# Patient Record
Sex: Male | Born: 1955 | Race: White | Hispanic: No | Marital: Married | Smoking: Former smoker
Health system: Southern US, Community
[De-identification: ages and names within clinical notes are randomized; demographics above are authoritative.]

## PROBLEM LIST (undated history)

## (undated) DIAGNOSIS — I639 Cerebral infarction, unspecified: Secondary | ICD-10-CM

## (undated) DIAGNOSIS — I2510 Atherosclerotic heart disease of native coronary artery without angina pectoris: Secondary | ICD-10-CM

## (undated) DIAGNOSIS — N35919 Unspecified urethral stricture, male, unspecified site: Secondary | ICD-10-CM

## (undated) DIAGNOSIS — M1990 Unspecified osteoarthritis, unspecified site: Secondary | ICD-10-CM

## (undated) DIAGNOSIS — K219 Gastro-esophageal reflux disease without esophagitis: Secondary | ICD-10-CM

## (undated) DIAGNOSIS — I10 Essential (primary) hypertension: Secondary | ICD-10-CM

## (undated) DIAGNOSIS — I219 Acute myocardial infarction, unspecified: Secondary | ICD-10-CM

## (undated) HISTORY — PX: CARDIAC CATHETERIZATION: SHX172

## (undated) HISTORY — PX: CORONARY ARTERY BYPASS GRAFT: SHX141

## (undated) HISTORY — PX: WRIST SURGERY: SHX841

## (undated) HISTORY — PX: VASECTOMY: SHX75

## (undated) HISTORY — PX: BACK SURGERY: SHX140

## (undated) HISTORY — DX: Cerebral infarction, unspecified: I63.9

---

## 2016-04-14 NOTE — H&P (Signed)
TOTAL HIP ADMISSION H&P  Patient is admitted for right total hip arthroplasty, anterior approach.  Subjective:  Chief Complaint:    Right hip primary OA / pain  HPI: Read Drivershillip Kluck, 60 y.o. male, has a history of pain and functional disability in the right hip(s) due to arthritis and patient has failed non-surgical conservative treatments for greater than 12 weeks to include NSAID's and/or analgesics, corticosteriod injections and activity modification.  Onset of symptoms was gradual starting 1.5-2 years ago with gradually worsening course since that time.The patient noted no past surgery on the right hip(s).  Patient currently rates pain in the right hip at 7 out of 10 with activity. Patient has worsening of pain with activity and weight bearing, trendelenberg gait, pain that interfers with activities of daily living and pain with passive range of motion. Patient has evidence of periarticular osteophytes and joint space narrowing by imaging studies. This condition presents safety issues increasing the risk of falls.  There is no current active infection.  Risks, benefits and expectations were discussed with the patient.  Risks including but not limited to the risk of anesthesia, blood clots, nerve damage, blood vessel damage, failure of the prosthesis, infection and up to and including death.  Patient understand the risks, benefits and expectations and wishes to proceed with surgery.   PCP: Pcp Not In System  D/C Plans:      Home  Post-op Meds:       No Rx given  Tranexamic Acid:      To be given - IV   Decadron:      Is to be given  FYI:     ASA  Norco     Past Medical History:  Diagnosis Date  . Arthritis    Osteoarthritis- right hip  . Coronary artery disease   . GERD (gastroesophageal reflux disease)   . Hypertension   . Myocardial infarction    " mild"-Dr. Zakhary,cardiolgy-[place] follows -gives clearance.  Marland Kitchen. Urethral stricture    'uses self JamaicaFrench caths weekly" to  correct.    Past Surgical History:  Procedure Laterality Date  . BACK SURGERY     lumbar discectomy  . CARDIAC CATHETERIZATION    . CORONARY ARTERY BYPASS GRAFT     09-20-2012 x3 -[place]  . VASECTOMY    . WRIST SURGERY     "implant of plastic bone"-mild ROM limits    No prescriptions prior to admission.   Allergies  Allergen Reactions  . Vancomycin Other (See Comments)    Arletha Grippeedman syndrome     Social History  Substance Use Topics  . Smoking status: Former Smoker    Packs/day: 1.50    Years: 15.00    Types: Cigarettes    Quit date: 04/20/2012  . Smokeless tobacco: Former NeurosurgeonUser    Types: Chew    Quit date: 04/20/2012  . Alcohol use Yes     Comment: occ. use       Review of Systems  Constitutional: Negative.   HENT: Negative.   Eyes: Negative.   Respiratory: Negative.   Cardiovascular: Negative.   Gastrointestinal: Positive for heartburn.  Genitourinary: Positive for urgency.  Musculoskeletal: Positive for joint pain.  Skin: Negative.   Neurological: Negative.   Endo/Heme/Allergies: Negative.   Psychiatric/Behavioral: Negative.     Objective:  Physical Exam  Constitutional: He is oriented to person, place, and time. He appears well-developed.  HENT:  Head: Normocephalic.  Eyes: Pupils are equal, round, and reactive to light.  Neck: Neck supple.  No JVD present. No tracheal deviation present. No thyromegaly present.  Cardiovascular: Normal rate, regular rhythm, normal heart sounds and intact distal pulses.   Respiratory: Effort normal and breath sounds normal. No respiratory distress. He has no wheezes.  GI: Soft. There is no tenderness. There is no guarding.  Musculoskeletal:       Right hip: He exhibits decreased range of motion, decreased strength, tenderness and bony tenderness. He exhibits no swelling, no deformity and no laceration.  Lymphadenopathy:    He has no cervical adenopathy.  Neurological: He is alert and oriented to person, place, and  time.  Skin: Skin is warm and dry.  Psychiatric: He has a normal mood and affect.        Imaging Review Plain radiographs demonstrate severe degenerative joint disease of the right hip(s). The bone quality appears to be good for age and reported activity level.  Assessment/Plan:  End stage arthritis, right hip(s)  The patient history, physical examination, clinical judgement of the provider and imaging studies are consistent with end stage degenerative joint disease of the right hip(s) and total hip arthroplasty is deemed medically necessary. The treatment options including medical management, injection therapy, arthroscopy and arthroplasty were discussed at length. The risks and benefits of total hip arthroplasty were presented and reviewed. The risks due to aseptic loosening, infection, stiffness, dislocation/subluxation,  thromboembolic complications and other imponderables were discussed.  The patient acknowledged the explanation, agreed to proceed with the plan and consent was signed. Patient is being admitted for inpatient treatment for surgery, pain control, PT, OT, prophylactic antibiotics, VTE prophylaxis, progressive ambulation and ADL's and discharge planning.The patient is planning to be discharged home.   Anastasio AuerbachMatthew S. Elonna Mcfarlane   PA-C  04/22/2016, 7:43 AM

## 2016-04-18 NOTE — Patient Instructions (Addendum)
Johnny Sullivan  04/18/2016   Your procedure is scheduled on: 04/26/2016    [facility]  Entrance [place]  elevators to 3rd floor to  Short Stay Center at    0515 AM.  Call this number if you have problems the morning of surgery [phone]   Remember: ONLY 1 PERSON MAY GO WITH YOU TO SHORT STAY TO GET  READY MORNING OF YOUR SURGERY.  Do not eat food or drink liquids :After Midnight.     Take these medicines the morning of surgery with A SIP OF WATER: Metoprolol ( Lopressor) .                                You may not have any metal on your body including hair pins and              piercings  Do not wear jewelry, , lotions, powders or perfumes, deodorant            .              Men may shave face and neck.   Do not bring valuables to the hospital. [facility] IS NOT             RESPONSIBLE   FOR VALUABLES.  Contacts, dentures or bridgework may not be worn into surgery.  Leave suitcase in the car. After surgery it may be brought to your room.       Special Instructions: N/A              Please Johnny over the following fact sheets you were given: _____________________________________________________________________             [facility] - Preparing [facility] Before surgery, you can play an important role.  Because skin is not sterile, your skin needs to be as free of germs as possible.  You can reduce the number of germs on your skin by washing with CHG (chlorahexidine gluconate) soap before surgery.  CHG is an antiseptic cleaner which kills germs and bonds with the skin to continue killing germs even after washing. Please DO NOT use if you have an allergy to CHG or antibacterial soaps.  If your skin becomes reddened/irritated stop using the CHG and inform your nurse when you arrive at Short Stay. Do not shave (including legs and underarms) for at least 48 hours prior to the first CHG shower.  You may shave your face/neck. Please  follow these instructions carefully:  1.  Shower with CHG Soap the night before surgery and the  morning of Surgery.  2.  If you choose to wash your hair, wash your hair first as usual with your  normal  shampoo.  3.  After you shampoo, rinse your hair and body thoroughly to remove the  shampoo.                           4.  Use CHG as you would any other liquid soap.  You can apply chg directly  to the skin and wash                       Gently with a scrungie or clean washcloth.  5.  Apply the CHG Soap to your body  ONLY FROM THE NECK DOWN.   Do not use on face/ open                           Wound or open sores. Avoid contact with eyes, ears mouth and genitals (private parts).                       Wash face,  Genitals (private parts) with your normal soap.             6.  Wash thoroughly, paying special attention to the area where your surgery  will be performed.  7.  Thoroughly rinse your body with warm water from the neck down.  8.  DO NOT shower/wash with your normal soap after using and rinsing off  the CHG Soap.                9.  Pat yourself dry with a clean towel.            10.  Wear clean pajamas.            11.  Place clean sheets on your bed the night of your first shower and do not  sleep with pets. Day of Surgery : Do not apply any lotions/deodorants the morning of surgery.  Please wear clean clothes to the hospital/surgery center.  FAILURE TO FOLLOW THESE INSTRUCTIONS MAY RESULT IN THE CANCELLATION OF YOUR SURGERY PATIENT SIGNATURE_________________________________  NURSE SIGNATURE__________________________________  ________________________________________________________________________  WHAT IS A BLOOD TRANSFUSION? Blood Transfusion Information  A transfusion is the replacement of blood or some of its parts. Blood is made up of multiple cells which provide different functions.  Red blood cells carry oxygen and are used for blood loss replacement.  White blood cells  fight against infection.  Platelets control bleeding.  Plasma helps clot blood.  Other blood products are available for specialized needs, such as hemophilia or other clotting disorders. BEFORE THE TRANSFUSION  Who gives blood for transfusions?   Healthy volunteers who are fully evaluated to make sure their blood is safe. This is blood bank blood. Transfusion therapy is the safest it has ever been in the practice of medicine. Before blood is taken from a donor, a complete history is taken to make sure that person has no history of diseases nor engages in risky social behavior (examples are intravenous drug use or sexual activity with multiple partners). The donor's travel history is screened to minimize risk of transmitting infections, such as malaria. The donated blood is tested for signs of infectious diseases, such as HIV and hepatitis. The blood is then tested to be sure it is compatible with you in order to minimize the chance of a transfusion reaction. If you or a relative donates blood, this is often done in anticipation of surgery and is not appropriate for emergency situations. It takes many days to process the donated blood. RISKS AND COMPLICATIONS Although transfusion therapy is very safe and saves many lives, the main dangers of transfusion include:   Getting an infectious disease.  Developing a transfusion reaction. This is an allergic reaction to something in the blood you were given. Every precaution is taken to prevent this. The decision to have a blood transfusion has been considered carefully by your caregiver before blood is given. Blood is not given unless the benefits outweigh the risks. AFTER THE TRANSFUSION  Right after receiving a blood transfusion, you will usually feel much  better and more energetic. This is especially true if your red blood cells have gotten low (anemic). The transfusion raises the level of the red blood cells which carry oxygen, and this usually  causes an energy increase.  The nurse administering the transfusion will monitor you carefully for complications. HOME CARE INSTRUCTIONS  No special instructions are needed after a transfusion. You may find your energy is better. Speak with your caregiver about any limitations on activity for underlying diseases you may have. SEEK MEDICAL CARE IF:   Your condition is not improving after your transfusion.  You develop redness or irritation at the intravenous (IV) site. SEEK IMMEDIATE MEDICAL CARE IF:  Any of the following symptoms occur over the next 12 hours:  Shaking chills.  You have a temperature by mouth above 102 F (38.9 C), not controlled by medicine.  Chest, back, or muscle pain.  People around you feel you are not acting correctly or are confused.  Shortness of breath or difficulty breathing.  Dizziness and fainting.  You get a rash or develop hives.  You have a decrease in urine output.  Your urine turns a dark color or changes to pink, red, or brown. Any of the following symptoms occur over the next 10 days:  You have a temperature by mouth above 102 F (38.9 C), not controlled by medicine.  Shortness of breath.  Weakness after normal activity.  The white part of the eye turns yellow (jaundice).  You have a decrease in the amount of urine or are urinating less often.  Your urine turns a dark color or changes to pink, red, or brown. Document Released: 04/22/2000 Document Revised: 07/18/2011 Document Reviewed: 12/10/2007 ExitCare Patient Information 2014 [place].  _______________________________________________________________________  Incentive Spirometer  An incentive spirometer is a tool that can help keep your lungs clear and active. This tool measures how well you are filling your lungs with each breath. Taking long deep breaths may help reverse or decrease the chance of developing breathing (pulmonary) problems (especially infection)  following:  A long period of time when you are unable to move or be active. BEFORE THE PROCEDURE   If the spirometer includes an indicator to show your best effort, your nurse or respiratory therapist will set it to a desired goal.  If possible, sit up straight or lean slightly forward. Try not to slouch.  Hold the incentive spirometer in an upright position. INSTRUCTIONS FOR USE  1. Sit on the edge of your bed if possible, or sit up as far as you can in bed or on a chair. 2. Hold the incentive spirometer in an upright position. 3. Breathe out normally. 4. Place the mouthpiece in your mouth and seal your lips tightly around it. 5. Breathe in slowly and as deeply as possible, raising the piston or the ball toward the top of the column. 6. Hold your breath for 3-5 seconds or for as long as possible. Allow the piston or ball to fall to the bottom of the column. 7. Remove the mouthpiece from your mouth and breathe out normally. 8. Rest for a few seconds and repeat Steps 1 through 7 at least 10 times every 1-2 hours when you are awake. Take your time and take a few normal breaths between deep breaths. 9. The spirometer may include an indicator to show your best effort. Use the indicator as a goal to work toward during each repetition. 10. After each set of 10 deep breaths, practice coughing to be sure  your lungs are clear. If you have an incision (the cut made at the time of surgery), support your incision when coughing by placing a pillow or rolled up towels firmly against it. Once you are able to get out of bed, walk around indoors and cough well. You may stop using the incentive spirometer when instructed by your caregiver.  RISKS AND COMPLICATIONS  Take your time so you do not get dizzy or light-headed.  If you are in pain, you may need to take or ask for pain medication before doing incentive spirometry. It is harder to take a deep breath if you are having pain. AFTER USE  Rest and  breathe slowly and easily.  It can be helpful to keep track of a log of your progress. Your caregiver can provide you with a simple table to help with this. If you are using the spirometer at home, follow these instructions: Reader IF:   You are having difficultly using the spirometer.  You have trouble using the spirometer as often as instructed.  Your pain medication is not giving enough relief while using the spirometer.  You develop fever of 100.5 F (38.1 C) or higher. SEEK IMMEDIATE MEDICAL CARE IF:   You cough up bloody sputum that had not been present before.  You develop fever of 102 F (38.9 C) or greater.  You develop worsening pain at or near the incision site. MAKE SURE YOU:   Understand these instructions.  Will watch your condition.  Will get help right away if you are not doing well or get worse. Document Released: 09/05/2006 Document Revised: 07/18/2011 Document Reviewed: 11/06/2006 [facility] Patient Information 2014 [place], [place].   ________________________________________________________________________

## 2016-04-20 ENCOUNTER — Encounter (HOSPITAL_COMMUNITY)
Admission: RE | Admit: 2016-04-20 | Discharge: 2016-04-20 | Disposition: A | Discharge status: Home / Self Care | Payer: BLUE CROSS/BLUE SHIELD | Source: Ambulatory Visit | Attending: Orthopedic Surgery | Admitting: Orthopedic Surgery

## 2016-04-20 ENCOUNTER — Encounter (HOSPITAL_COMMUNITY): Payer: Self-pay

## 2016-04-20 DIAGNOSIS — Z01812 Encounter for preprocedural laboratory examination: Secondary | ICD-10-CM | POA: Diagnosis present

## 2016-04-20 HISTORY — DX: Unspecified osteoarthritis, unspecified site: M19.90

## 2016-04-20 HISTORY — DX: Gastro-esophageal reflux disease without esophagitis: K21.9

## 2016-04-20 HISTORY — DX: Acute myocardial infarction, unspecified: I21.9

## 2016-04-20 HISTORY — DX: Unspecified urethral stricture, male, unspecified site: N35.919

## 2016-04-20 HISTORY — DX: Atherosclerotic heart disease of native coronary artery without angina pectoris: I25.10

## 2016-04-20 HISTORY — DX: Essential (primary) hypertension: I10

## 2016-04-20 LAB — CBC
HEMATOCRIT: 39.8 % (ref 39.0–52.0)
HEMOGLOBIN: 13.1 g/dL (ref 13.0–17.0)
MCH: 28.7 pg (ref 26.0–34.0)
MCHC: 32.9 g/dL (ref 30.0–36.0)
MCV: 87.1 fL (ref 78.0–100.0)
Platelets: 391 10*3/uL (ref 150–400)
RBC: 4.57 MIL/uL (ref 4.22–5.81)
RDW: 13.4 % (ref 11.5–15.5)
WBC: 9.4 10*3/uL (ref 4.0–10.5)

## 2016-04-20 LAB — BASIC METABOLIC PANEL
ANION GAP: 7 (ref 5–15)
BUN: 18 mg/dL (ref 6–20)
CALCIUM: 9.1 mg/dL (ref 8.9–10.3)
CO2: 26 mmol/L (ref 22–32)
Chloride: 106 mmol/L (ref 101–111)
Creatinine, Ser: 0.75 mg/dL (ref 0.61–1.24)
GLUCOSE: 100 mg/dL — AB (ref 65–99)
POTASSIUM: 4.7 mmol/L (ref 3.5–5.1)
SODIUM: 139 mmol/L (ref 135–145)

## 2016-04-20 LAB — SURGICAL PCR SCREEN
MRSA, PCR: NEGATIVE
STAPHYLOCOCCUS AUREUS: NEGATIVE

## 2016-04-20 LAB — ABO/RH: ABO/RH(D): A POS

## 2016-04-20 NOTE — Pre-Procedure Instructions (Signed)
EKG 8'17,Echo 11'17, Stress 11'17 .8'17 LOV notes and 04-11-16 clearance Dr. Rockne MenghiniZakhary  With chart.

## 2016-04-25 NOTE — Anesthesia Preprocedure Evaluation (Addendum)
Anesthesia Evaluation  Patient identified by MRN, date of birth, ID band Patient awake    Reviewed: Allergy & Precautions, H&P , Patient's Chart, lab work & pertinent test results, reviewed documented beta blocker date and time   Airway Mallampati: II  TM Distance: >3 FB Neck ROM: full    Dental no notable dental hx.    Pulmonary former smoker,    Pulmonary exam normal breath sounds clear to auscultation       Cardiovascular hypertension,  Rhythm:regular Rate:Normal     Neuro/Psych    GI/Hepatic   Endo/Other    Renal/GU      Musculoskeletal   Abdominal   Peds  Hematology   Anesthesia Other Findings Hypertension   Coronary artery disease   Myocardial infarction  " mild"-Dr. Zakhary,cardiolgy-[place] follows -gives clearance. GERD (gastroesophageal reflux disease)      Reproductive/Obstetrics                             Anesthesia Physical Anesthesia Plan  ASA: II  Anesthesia Plan: Spinal   Post-op Pain Management:    Induction:   Airway Management Planned:   Additional Equipment:   Intra-op Plan:   Post-operative Plan:   Informed Consent: I have reviewed the patients History and Physical, chart, labs and discussed the procedure including the risks, benefits and alternatives for the proposed anesthesia with the patient or authorized representative who has indicated his/her understanding and acceptance.   Dental Advisory Given  Plan Discussed with: CRNA  Anesthesia Plan Comments: (Lab work confirmed with CRNA in room. Platelets okay. Discussed spinal anesthetic, and patient consents to the procedure:  included risk of possible headache,backache, failed block, allergic reaction, and nerve injury. This patient was asked if she had any questions or concerns before the procedure started. )        Anesthesia Quick Evaluation

## 2016-04-26 ENCOUNTER — Encounter (HOSPITAL_COMMUNITY): Payer: Self-pay

## 2016-04-26 ENCOUNTER — Inpatient Hospital Stay (HOSPITAL_COMMUNITY): Payer: BLUE CROSS/BLUE SHIELD | Admitting: Anesthesiology

## 2016-04-26 ENCOUNTER — Ambulatory Visit (HOSPITAL_COMMUNITY): Payer: BLUE CROSS/BLUE SHIELD

## 2016-04-26 ENCOUNTER — Encounter (HOSPITAL_COMMUNITY)
Admission: RE | Disposition: A | Discharge status: Home / Self Care | Payer: Self-pay | Source: Ambulatory Visit | Attending: Orthopedic Surgery

## 2016-04-26 ENCOUNTER — Ambulatory Visit (HOSPITAL_COMMUNITY)
Admission: RE | Admit: 2016-04-26 | Discharge: 2016-04-26 | Disposition: A | Discharge status: Home / Self Care | Payer: BLUE CROSS/BLUE SHIELD | Source: Ambulatory Visit | Attending: Orthopedic Surgery | Admitting: Orthopedic Surgery

## 2016-04-26 DIAGNOSIS — M1611 Unilateral primary osteoarthritis, right hip: Secondary | ICD-10-CM | POA: Diagnosis not present

## 2016-04-26 DIAGNOSIS — I2510 Atherosclerotic heart disease of native coronary artery without angina pectoris: Secondary | ICD-10-CM | POA: Insufficient documentation

## 2016-04-26 DIAGNOSIS — I10 Essential (primary) hypertension: Secondary | ICD-10-CM | POA: Diagnosis not present

## 2016-04-26 DIAGNOSIS — K219 Gastro-esophageal reflux disease without esophagitis: Secondary | ICD-10-CM | POA: Insufficient documentation

## 2016-04-26 DIAGNOSIS — R12 Heartburn: Secondary | ICD-10-CM | POA: Insufficient documentation

## 2016-04-26 DIAGNOSIS — Z951 Presence of aortocoronary bypass graft: Secondary | ICD-10-CM | POA: Diagnosis not present

## 2016-04-26 DIAGNOSIS — Z87891 Personal history of nicotine dependence: Secondary | ICD-10-CM | POA: Insufficient documentation

## 2016-04-26 DIAGNOSIS — I252 Old myocardial infarction: Secondary | ICD-10-CM | POA: Insufficient documentation

## 2016-04-26 DIAGNOSIS — Z881 Allergy status to other antibiotic agents status: Secondary | ICD-10-CM | POA: Diagnosis not present

## 2016-04-26 DIAGNOSIS — Z96649 Presence of unspecified artificial hip joint: Secondary | ICD-10-CM

## 2016-04-26 DIAGNOSIS — R3915 Urgency of urination: Secondary | ICD-10-CM | POA: Insufficient documentation

## 2016-04-26 DIAGNOSIS — Z96641 Presence of right artificial hip joint: Secondary | ICD-10-CM

## 2016-04-26 HISTORY — PX: TOTAL HIP ARTHROPLASTY: SHX124

## 2016-04-26 LAB — TYPE AND SCREEN
ABO/RH(D): A POS
Antibody Screen: NEGATIVE

## 2016-04-26 SURGERY — ARTHROPLASTY, HIP, TOTAL, ANTERIOR APPROACH
Anesthesia: Spinal | Site: Hip | Laterality: Right

## 2016-04-26 MED ORDER — METHOCARBAMOL 1000 MG/10ML IJ SOLN
500.0000 mg | Freq: Four times a day (QID) | INTRAVENOUS | Status: DC | PRN
  Administered 2016-04-26: 500 mg via INTRAVENOUS
  Filled 2016-04-26: qty 550
  Filled 2016-04-26: qty 5

## 2016-04-26 MED ORDER — LACTATED RINGERS IV SOLN
INTRAVENOUS | Status: DC | PRN
  Administered 2016-04-26 (×2): via INTRAVENOUS

## 2016-04-26 MED ORDER — PROPOFOL 10 MG/ML IV BOLUS
INTRAVENOUS | Status: AC
  Filled 2016-04-26: qty 20

## 2016-04-26 MED ORDER — HYDROMORPHONE HCL 1 MG/ML IJ SOLN: 0.5000 mg | INTRAMUSCULAR | Status: DC | PRN

## 2016-04-26 MED ORDER — CEPHALEXIN 500 MG PO CAPS: 500.0000 mg | ORAL_CAPSULE | Freq: Three times a day (TID) | ORAL | 0 refills | Status: DC

## 2016-04-26 MED ORDER — CEFAZOLIN SODIUM-DEXTROSE 2-4 GM/100ML-% IV SOLN
INTRAVENOUS | Status: AC
  Filled 2016-04-26: qty 100

## 2016-04-26 MED ORDER — CEFAZOLIN SODIUM-DEXTROSE 2-4 GM/100ML-% IV SOLN
2.0000 g | INTRAVENOUS | Status: AC
  Administered 2016-04-26: 2 g via INTRAVENOUS

## 2016-04-26 MED ORDER — MIDAZOLAM HCL 5 MG/5ML IJ SOLN
INTRAMUSCULAR | Status: DC | PRN
  Administered 2016-04-26: 2 mg via INTRAVENOUS

## 2016-04-26 MED ORDER — PROPOFOL 10 MG/ML IV BOLUS
INTRAVENOUS | Status: AC
  Filled 2016-04-26: qty 40

## 2016-04-26 MED ORDER — HYDROCODONE-ACETAMINOPHEN 7.5-325 MG PO TABS
1.0000 | ORAL_TABLET | ORAL | Status: DC | PRN
  Administered 2016-04-26: 1 via ORAL
  Filled 2016-04-26: qty 1

## 2016-04-26 MED ORDER — DEXAMETHASONE SODIUM PHOSPHATE 10 MG/ML IJ SOLN
10.0000 mg | Freq: Once | INTRAMUSCULAR | Status: AC
  Administered 2016-04-26: 10 mg via INTRAVENOUS

## 2016-04-26 MED ORDER — SODIUM CHLORIDE 0.9 % IV BOLUS (SEPSIS)
500.0000 mL | Freq: Once | INTRAVENOUS | Status: AC
  Administered 2016-04-26: 500 mL via INTRAVENOUS

## 2016-04-26 MED ORDER — HYDROMORPHONE HCL 2 MG PO TABS: 2.0000 mg | ORAL_TABLET | Freq: Four times a day (QID) | ORAL | 0 refills | Status: DC | PRN

## 2016-04-26 MED ORDER — TRANEXAMIC ACID 1000 MG/10ML IV SOLN
1000.0000 mg | Freq: Once | INTRAVENOUS | Status: AC
  Administered 2016-04-26: 1000 mg via INTRAVENOUS
  Filled 2016-04-26: qty 1100

## 2016-04-26 MED ORDER — FENTANYL CITRATE (PF) 100 MCG/2ML IJ SOLN
INTRAMUSCULAR | Status: AC
  Filled 2016-04-26: qty 2

## 2016-04-26 MED ORDER — PHENYLEPHRINE HCL 10 MG/ML IJ SOLN
INTRAVENOUS | Status: DC | PRN
  Administered 2016-04-26: 10 ug/min via INTRAVENOUS

## 2016-04-26 MED ORDER — CHLORHEXIDINE GLUCONATE 4 % EX LIQD: 60.0000 mL | Freq: Once | CUTANEOUS | Status: DC

## 2016-04-26 MED ORDER — PHENYLEPHRINE HCL 10 MG/ML IJ SOLN
INTRAMUSCULAR | Status: DC | PRN
  Administered 2016-04-26 (×2): 40 ug via INTRAVENOUS

## 2016-04-26 MED ORDER — DIPHENHYDRAMINE HCL 25 MG PO CAPS
25.0000 mg | ORAL_CAPSULE | Freq: Four times a day (QID) | ORAL | Status: DC | PRN
  Filled 2016-04-26: qty 1

## 2016-04-26 MED ORDER — FENTANYL CITRATE (PF) 100 MCG/2ML IJ SOLN
INTRAMUSCULAR | Status: DC | PRN
Start: 2016-04-26 — End: 2016-04-26
  Administered 2016-04-26: 50 ug via INTRAVENOUS

## 2016-04-26 MED ORDER — PHENYLEPHRINE HCL 10 MG/ML IJ SOLN
INTRAMUSCULAR | Status: AC
  Filled 2016-04-26: qty 1

## 2016-04-26 MED ORDER — PROPOFOL 500 MG/50ML IV EMUL
INTRAVENOUS | Status: DC | PRN
  Administered 2016-04-26: 100 ug/kg/min via INTRAVENOUS

## 2016-04-26 MED ORDER — HYDROCODONE-ACETAMINOPHEN 7.5-325 MG PO TABS: 1.0000 | ORAL_TABLET | ORAL | 0 refills | Status: DC | PRN

## 2016-04-26 MED ORDER — ASPIRIN 81 MG PO CHEW: 81.0000 mg | CHEWABLE_TABLET | Freq: Two times a day (BID) | ORAL | 0 refills | Status: AC

## 2016-04-26 MED ORDER — TRANEXAMIC ACID 1000 MG/10ML IV SOLN
1000.0000 mg | INTRAVENOUS | Status: AC
  Administered 2016-04-26: 1000 mg via INTRAVENOUS
  Filled 2016-04-26: qty 1100

## 2016-04-26 MED ORDER — ONDANSETRON HCL 4 MG/2ML IJ SOLN
INTRAMUSCULAR | Status: AC
  Filled 2016-04-26: qty 2

## 2016-04-26 MED ORDER — DEXAMETHASONE SODIUM PHOSPHATE 10 MG/ML IJ SOLN
INTRAMUSCULAR | Status: AC
  Filled 2016-04-26: qty 1

## 2016-04-26 MED ORDER — METHOCARBAMOL 500 MG PO TABS: 500.0000 mg | ORAL_TABLET | Freq: Four times a day (QID) | ORAL | 0 refills | Status: DC | PRN

## 2016-04-26 MED ORDER — METHOCARBAMOL 500 MG PO TABS: 500.0000 mg | ORAL_TABLET | Freq: Four times a day (QID) | ORAL | Status: DC | PRN

## 2016-04-26 MED ORDER — SODIUM CHLORIDE 0.9 % IR SOLN
Status: DC | PRN
  Administered 2016-04-26: 1000 mL

## 2016-04-26 MED ORDER — ONDANSETRON HCL 4 MG/2ML IJ SOLN
INTRAMUSCULAR | Status: DC | PRN
  Administered 2016-04-26: 4 mg via INTRAVENOUS

## 2016-04-26 MED ORDER — MIDAZOLAM HCL 2 MG/2ML IJ SOLN
INTRAMUSCULAR | Status: AC
  Filled 2016-04-26: qty 2

## 2016-04-26 MED FILL — CEPHALEXIN 500 MG CAPSULE: 500 | 5 days supply | Qty: 15 | Fill #0

## 2016-04-26 MED FILL — HYDROmorphone HCL 2 MG TABS: 2 | 2 days supply | Qty: 16 | Fill #0

## 2016-04-26 MED FILL — METHOCARBAMOL 500 MG TABLET: 500 | 13 days supply | Qty: 50 | Fill #0

## 2016-04-26 MED FILL — HYDROCODON-APAP 7.5-325: 7.5-325 | 9 days supply | Qty: 100 | Fill #0

## 2016-04-26 SURGICAL SUPPLY — 36 items
BAG DECANTER FOR FLEXI CONT (MISCELLANEOUS) IMPLANT
BAG ZIPLOCK 12X15 (MISCELLANEOUS) IMPLANT
BLADE SAG 18X100X1.27 (BLADE) ×3 IMPLANT
CAPT HIP TOTAL 2 ×3 IMPLANT
CLOTH BEACON ORANGE TIMEOUT ST (SAFETY) ×3 IMPLANT
COVER PERINEAL POST (MISCELLANEOUS) ×3 IMPLANT
DERMABOND ADVANCED (GAUZE/BANDAGES/DRESSINGS) ×2
DERMABOND ADVANCED .7 DNX12 (GAUZE/BANDAGES/DRESSINGS) ×1 IMPLANT
DRAPE STERI IOBAN 125X83 (DRAPES) ×3 IMPLANT
DRAPE U-SHAPE 47X51 STRL (DRAPES) ×6 IMPLANT
DRESSING AQUACEL AG SP 3.5X10 (GAUZE/BANDAGES/DRESSINGS) ×1 IMPLANT
DRSG AQUACEL AG ADV 3.5X10 (GAUZE/BANDAGES/DRESSINGS) ×3 IMPLANT
DRSG AQUACEL AG SP 3.5X10 (GAUZE/BANDAGES/DRESSINGS) ×3
DURAPREP 26ML APPLICATOR (WOUND CARE) ×3 IMPLANT
ELECT REM PT RETURN 15FT ADLT (MISCELLANEOUS) IMPLANT
ELECT REM PT RETURN 9FT ADLT (ELECTROSURGICAL) ×3
ELECTRODE REM PT RTRN 9FT ADLT (ELECTROSURGICAL) ×1 IMPLANT
GLOVE BIOGEL M STRL SZ7.5 (GLOVE) IMPLANT
GLOVE BIOGEL PI IND STRL 7.5 (GLOVE) ×5 IMPLANT
GLOVE BIOGEL PI IND STRL 8.5 (GLOVE) ×1 IMPLANT
GLOVE BIOGEL PI INDICATOR 7.5 (GLOVE) ×10
GLOVE BIOGEL PI INDICATOR 8.5 (GLOVE) ×2
GLOVE ECLIPSE 8.0 STRL XLNG CF (GLOVE) ×6 IMPLANT
GLOVE ORTHO TXT STRL SZ7.5 (GLOVE) ×3 IMPLANT
GOWN STRL REUS W/TWL LRG LVL3 (GOWN DISPOSABLE) ×6 IMPLANT
GOWN STRL REUS W/TWL XL LVL3 (GOWN DISPOSABLE) ×6 IMPLANT
HOLDER FOLEY CATH W/STRAP (MISCELLANEOUS) ×3 IMPLANT
PACK ANTERIOR HIP CUSTOM (KITS) ×3 IMPLANT
SUT MNCRL AB 4-0 PS2 18 (SUTURE) ×3 IMPLANT
SUT VIC AB 1 CT1 36 (SUTURE) ×9 IMPLANT
SUT VIC AB 2-0 CT1 27 (SUTURE) ×4
SUT VIC AB 2-0 CT1 TAPERPNT 27 (SUTURE) ×2 IMPLANT
SUT VLOC 180 0 24IN GS25 (SUTURE) ×3 IMPLANT
TRAY FOLEY W/METER SILVER 16FR (SET/KITS/TRAYS/PACK) IMPLANT
WATER STERILE IRR 1500ML POUR (IV SOLUTION) ×6 IMPLANT
YANKAUER SUCT BULB TIP 10FT TU (MISCELLANEOUS) IMPLANT

## 2016-04-26 NOTE — Anesthesia Postprocedure Evaluation (Signed)
Anesthesia Post Note  Patient: Johnny Sullivan  Procedure(s) Performed: Procedure(s) (LRB): RIGHT TOTAL HIP ARTHROPLASTY ANTERIOR APPROACH (Right)  Patient location during evaluation: PACU Anesthesia Type: Spinal Level of consciousness: awake Pain management: satisfactory to patient Vital Signs Assessment: post-procedure vital signs reviewed and stable Respiratory status: spontaneous breathing Cardiovascular status: blood pressure returned to baseline Postop Assessment: no headache and spinal receding Anesthetic complications: no       Last Vitals:  Vitals:   04/26/16 1053 04/26/16 1402  BP: (!) 129/51 (!) 160/74  Pulse:  85  Resp: 16 18  Temp: 37 C 37.6 C    Last Pain:  Vitals:   04/26/16 1402  TempSrc: Oral  PainSc: 2                  Gabrielle Mester EDWARD

## 2016-04-26 NOTE — Progress Notes (Addendum)
Patient has voided and spinal has worn off. Dangling at bedside in preparation for PT. Patient to be discharged today.  1400  Called Dr Charlann Boxerlin about elevated temperature prior to patient leaving. Patient is to use incentive spirometer 10x per hour, take oral temp every 4 hours at home, and use tylenol to bring temperature down. Family verbalizes understanding.

## 2016-04-26 NOTE — Progress Notes (Signed)
Patient is S/P anterior hip replacement and discharged this afternoon.

## 2016-04-26 NOTE — Transfer of Care (Signed)
Immediate Anesthesia Transfer of Care Note  Patient: Johnny Sullivan  Procedure(s) Performed: Procedure(s): RIGHT TOTAL HIP ARTHROPLASTY ANTERIOR APPROACH (Right)  Patient Location: PACU  Anesthesia Type:Spinal  Level of Consciousness: awake, alert  and oriented  Airway & Oxygen Therapy: Patient Spontanous Breathing and Patient connected to face mask oxygen  Post-op Assessment: Report given to RN and Post -op Vital signs reviewed and stable  Post vital signs: Reviewed and stable  Last Vitals:  Vitals:   04/26/16 0609  BP: (!) 145/79  Pulse: 74  Resp: 16  Temp: 36.8 C    Last Pain:  Vitals:   04/26/16 0609  TempSrc: Oral  PainSc:          Complications: No apparent anesthesia complications

## 2016-04-26 NOTE — Interval H&P Note (Signed)
History and Physical Interval Note:  04/26/2016 7:07 AM  Read DriversPhillip Sullivan  has presented today for surgery, with the diagnosis of RIGHT HIP OA  The various methods of treatment have been discussed with the patient and family. After consideration of risks, benefits and other options for treatment, the patient has consented to  Procedure(s): RIGHT TOTAL HIP ARTHROPLASTY ANTERIOR APPROACH (Right) as a surgical intervention .  The patient's history has been reviewed, patient examined, no change in status, stable for surgery.  I have reviewed the patient's chart and labs.  Questions were answered to the patient's satisfaction.     Shelda PalLIN,Johnny Sullivan D

## 2016-04-26 NOTE — Op Note (Signed)
NAME:  Johnny Sullivan                ACCOUNT NO.: [id number]      MEDICAL RECORD NO.: [id number]      FACILITY:  [facility]      PHYSICIAN:  Durene Romans D  DATE OF BIRTH:  Oct 22, 1955     DATE OF PROCEDURE:  04/26/2016                                 OPERATIVE REPORT         PREOPERATIVE DIAGNOSIS: Right  hip osteoarthritis.      POSTOPERATIVE DIAGNOSIS:  Right hip osteoarthritis.      PROCEDURE:  Right total hip replacement through an anterior approach   utilizing DePuy THR system, component size 56mm pinnacle cup, a size 36+4 neutral   Altrex liner, a size 4 Hi Tri Lock stem with a 36+1.5 delta ceramic   ball.      SURGEON:  Madlyn Frankel. Charlann Boxer, M.D.      ASSISTANT:  Lanney Gins, PA-C     ANESTHESIA:  Spinal.      SPECIMENS:  None.      COMPLICATIONS:  None.      BLOOD LOSS:  600 cc     DRAINS:  None.      INDICATION OF THE PROCEDURE:  Johnny Sullivan is a 60 y.o. male who had   presented to office for evaluation of right hip pain.  Radiographs revealed   progressive degenerative changes with bone-on-bone   articulation to the  hip joint.  The patient had painful limited range of   motion significantly affecting their overall quality of life.  The patient was failing to    respond to conservative measures, and at this point was ready   to proceed with more definitive measures.  The patient has noted progressive   degenerative changes in his hip, progressive problems and dysfunction   with regarding the hip prior to surgery.  Consent was obtained for   benefit of pain relief.  Specific risk of infection, DVT, component   failure, dislocation, need for revision surgery, as well discussion of   the anterior versus posterior approach were reviewed.  Consent was   obtained for benefit of anterior pain relief through an anterior   approach.      PROCEDURE IN DETAIL:  The patient was brought to operative theater.   Once adequate anesthesia,  preoperative antibiotics, 2gm of Ancef, 1 gm of Tranexamic Acid and 10 mg of Decadron administered.   The patient was positioned supine on the OSI Hanna table.  Once adequate   padding of boney process was carried out, we had predraped out the hip, and  used fluoroscopy to confirm orientation of the pelvis and position.      The right hip was then prepped and draped from proximal iliac crest to   mid thigh with shower curtain technique.      Time-out was performed identifying the patient, planned procedure, and   extremity.     An incision was then made 2 cm distal and lateral to the   anterior superior iliac spine extending over the orientation of the   tensor fascia lata muscle and sharp dissection was carried down to the   fascia of the muscle and protractor placed in the soft tissues.      The fascia was then incised.  The muscle belly was identified and swept   laterally and retractor placed along the superior neck.  Following   cauterization of the circumflex vessels and removing some pericapsular   fat, a second cobra retractor was placed on the inferior neck.  A third   retractor was placed on the anterior acetabulum after elevating the   anterior rectus.  A L-capsulotomy was along the line of the   superior neck to the trochanteric fossa, then extended proximally and   distally.  Tag sutures were placed and the retractors were then placed   intracapsular.  We then identified the trochanteric fossa and   orientation of my neck cut, confirmed this radiographically   and then made a neck osteotomy with the femur on traction.  The femoral   head was removed without difficulty or complication.  Traction was let   off and retractors were placed posterior and anterior around the   acetabulum.      The labrum and foveal tissue were debrided.  I began reaming with a 46mm   reamer and reamed up to 55mm reamer with good bony bed preparation and a 56mm   cup was chosen.  The final 56mm  Pinnacle cup was then impacted under fluoroscopy  to confirm the depth of penetration and orientation with respect to   abduction.  A screw was placed followed by the hole eliminator.  The final   36+4 neutral Altrex liner was impacted with good visualized rim fit.  The cup was positioned anatomically within the acetabular portion of the pelvis.      At this point, the femur was rolled at 80 degrees.  Further capsule was   released off the inferior aspect of the femoral neck.  I then   released the superior capsule proximally.  The hook was placed laterally   along the femur and elevated manually and held in position with the bed   hook.  The leg was then extended and adducted with the leg rolled to 100   degrees of external rotation.  Once the proximal femur was fully   exposed, I used a box osteotome to set orientation.  I then began   broaching with the starting chili pepper broach and passed this by hand and then broached up to 4.  With the 4 broach in place I chose a high offset neck and did several trial reductions.  The offset was appropriate, leg lengths   appeared to be equal best matched with the +1.5 head ball confirmed radiographically.   Given these findings, I went ahead and dislocated the hip, repositioned all   retractors and positioned the right hip in the extended and abducted position.  The final 4 Hi Tri Lock stem was   chosen and it was impacted down to the level of neck cut.  Based on this   and the trial reduction, a 36+1.5 delta ceramic ball was chosen and   impacted onto a clean and dry trunnion, and the hip was reduced.  The   hip had been irrigated throughout the case again at this point.  I did   reapproximate the superior capsular leaflet to the anterior leaflet   using #1 Vicryl.  The fascia of the   tensor fascia lata muscle was then reapproximated using #1 Vicryl and #0 V-lock sutures.  The   remaining wound was closed with 2-0 Vicryl and running 4-0 Monocryl.    The hip was cleaned, dried, and dressed sterilely using  Dermabond and   Aquacel dressing.  He was then brought   to recovery room in stable condition tolerating the procedure well.    Lanney GinsMatthew Babish, PA-C was present for the entirety of the case involved from   preoperative positioning, perioperative retractor management, general   facilitation of the case, as well as primary wound closure as assistant.            Madlyn FrankelMatthew D. Charlann Boxerlin, M.D.        04/26/2016 9:17 AM

## 2016-04-26 NOTE — Evaluation (Signed)
Physical Therapy Evaluation Patient Details Name: Johnny Sullivan MRN: [id number] DOB: 06/27/1955 Today's Date: 04/26/2016   History of Present Illness  R THA via direct anterior approach  Clinical Impression  The patient is tolerating ambulation and exercises. Plans Dc today. Son is a PT. Ready for DC.    Follow Up Recommendations No PT follow up    Equipment Recommendations  Rolling walker with 5" wheels    Recommendations for Other Services       Precautions / Restrictions Precautions Precautions: Fall      Mobility  Bed Mobility Overal bed mobility: Needs Assistance Bed Mobility: Sit to Supine;Supine to Sit     Supine to sit: Min assist Sit to supine: Min guard   General bed mobility comments: cues for technique, used sheet  to self assist to get to sittinf. managed the right leg onto the bed.  Transfers Overall transfer level: Needs assistance Equipment used: Rolling walker (2 wheeled) Transfers: Sit to/from Stand Sit to Stand: Min guard         General transfer comment: cues  for hand and right leg position  Ambulation/Gait Ambulation/Gait assistance: Min guard Ambulation Distance (Feet): 200 Feet Assistive device: Rolling walker (2 wheeled) Gait Pattern/deviations: Step-to pattern;Step-through pattern;Antalgic     General Gait Details: cues for sequence  Stairs Stairs: Yes       General stair comments: verbal review for up with good  Wheelchair Mobility    Modified Rankin (Stroke Patients Only)       Balance                                             Pertinent Vitals/Pain Pain Assessment: 0-10 Pain Score: 3  Pain Location: right thigh Pain Descriptors / Indicators: Discomfort;Sore Pain Intervention(s): Monitored during session;Premedicated before session;Ice applied    Home Living Family/patient expects to be discharged to:: Private residence Living Arrangements: Spouse/significant other Available Help at  Discharge: Family Type of Home: House Home Access: Stairs to enter Entrance Stairs-Rails: None Entrance Stairs-Number of Steps: 1/2 step Home Layout: One level Home Equipment: Cane - single point      Prior Function Level of Independence: Independent               Hand Dominance        Extremity/Trunk Assessment   Upper Extremity Assessment Upper Extremity Assessment: Overall WFL for tasks assessed    Lower Extremity Assessment Lower Extremity Assessment: RLE deficits/detail RLE Deficits / Details: advances the leg    Cervical / Trunk Assessment Cervical / Trunk Assessment: Normal  Communication   Communication: No difficulties  Cognition Arousal/Alertness: Awake/alert Behavior During Therapy: WFL for tasks assessed/performed Overall Cognitive Status: Within Functional Limits for tasks assessed                      General Comments      Exercises Total Joint Exercises Ankle Circles/Pumps: AROM;10 reps;Both Quad Sets: AROM;10 reps;Both Short Arc Quad: AROM;Right;10 reps Heel Slides: AROM;Right;10 reps Hip ABduction/ADduction: AROM;Right;10 reps Long Arc Quad: AROM;Right;10 reps   Assessment/Plan    PT Assessment Patent does not need any further PT services (patient's son is a PT)  PT Problem List            PT Treatment Interventions      PT Goals (Current goals can be found in the Care Plan  section)  Acute Rehab PT Goals Patient Stated Goal: to go home, climb on my tractor PT Goal Formulation: All assessment and education complete, DC therapy    Frequency     Barriers to discharge        Co-evaluation               End of Session   Activity Tolerance: Patient tolerated treatment well Patient left: in chair;with call bell/phone within reach;with nursing/sitter in room;with family/visitor present Nurse Communication: Mobility status         Time: 1330-1400 PT Time Calculation (min) (ACUTE ONLY): 30 min   Charges:    PT Evaluation $PT Eval Low Complexity: 1 Procedure PT Treatments $Gait Training: 8-22 mins   PT G Codes:        Johnny Sullivan, Johnny Sullivan 04/26/2016, 2:48 PM

## 2016-04-26 NOTE — Discharge Instructions (Signed)
Spinal Anesthesia and Epidural Anesthesia, Care After These instructions provide you with information about caring for yourself after your procedure. Your health care provider may also give you more specific instructions. Your treatment has been planned according to current medical practices, but problems sometimes occur. Call your health care provider if you have any problems or questions after your procedure. What can I expect after the procedure? After the procedure, it is common to have:  Sleepiness.  Nausea.  Vomiting.  Numbness or tingling in your legs.  Trouble urinating.  Itching. Follow these instructions at home: For at least 24 hours after the procedure:   Do not:  Participate in activities in which you could fall or become injured.  Drive.  Use heavy machinery.  Drink alcohol.  Take sleeping pills or medicines that cause drowsiness.  Make important decisions or sign legal documents.  Take care of children on your own.  Rest. Eating and drinking  If you vomit, drink water, juice, or soup when you can drink without vomiting.  Make sure you have little or no nausea before eating solid foods.  Follow the diet that is recommended by your health care provider. General instructions  Have a responsible adult stay with you until you are awake and alert.  Take over-the-counter and prescription medicines only as told by your health care provider.  If you smoke, do not smoke without supervision.  Keep all follow-up visits as told by your health care provider. This is important. Contact a health care provider if:  You have nausea and vomiting that continue the day after anesthetic medicine was given.  You develop a rash. Get help right away if:  You have a fever.  You have a persistent or severe headache.  You develop blurred or double vision.  You develop dizziness or feel light-headed.  You faint.  You have weakness, numbness, or tingling in your  arms or legs.  You have difficulty breathing.  You are unable to pass urine. This information is not intended to replace advice given to you by your health care provider. Make sure you discuss any questions you have with your health care provider. Document Released: 07/16/2003 Document Revised: 10/02/2015 Document Reviewed: 08/17/2015 Elsevier Interactive Patient Education  2017 [employer]

## 2016-04-26 NOTE — Discharge Summary (Signed)
Physician Discharge Summary  Patient ID: Johnny Sullivan MRN: [id number] DOB/AGE: 60/08/1955 60 y.o.  Admit date: 04/26/2016 Discharge date: 04/26/2016   Procedures:  Procedure(s) (LRB): RIGHT TOTAL HIP ARTHROPLASTY ANTERIOR APPROACH (Right)  Attending Physician:  Dr. Durene RomansMatthew Olin   Admission Diagnoses:   Right hip primary OA / pain  Discharge Diagnoses:  Principal Problem:   S/P right THA, AA  Past Medical History:  Diagnosis Date  . Arthritis    Osteoarthritis- right hip  . Coronary artery disease   . GERD (gastroesophageal reflux disease)   . Hypertension   . Myocardial infarction    " mild"-Dr. Zakhary,[facility] follows -gives clearance.  Marland Kitchen. Urethral stricture    'uses self [place] caths weekly" to correct.    HPI:    Johnny Sullivan, 60 y.o. male, has a history of pain and functional disability in the right hip(s) due to arthritis and patient has failed non-surgical conservative treatments for greater than 12 weeks to include NSAID's and/or analgesics, corticosteriod injections and activity modification.  Onset of symptoms was gradual starting 1.5-2 years ago with gradually worsening course since that time.The patient noted no past surgery on the right hip(s).  Patient currently rates pain in the right hip at 7 out of 10 with activity. Patient has worsening of pain with activity and weight bearing, trendelenberg gait, pain that interfers with activities of daily living and pain with passive range of motion. Patient has evidence of periarticular osteophytes and joint space narrowing by imaging studies. This condition presents safety issues increasing the risk of falls. There is no current active infection.  Risks, benefits and expectations were discussed with the patient.  Risks including but not limited to the risk of anesthesia, blood clots, nerve damage, blood vessel damage, failure of the prosthesis, infection and up to and including death.  Patient  understand the risks, benefits and expectations and wishes to proceed with surgery.  PCP: Pcp Not In System   Discharged Condition: good  Hospital Course:  Patient underwent the above stated procedure on 04/26/2016. Patient tolerated the procedure well and brought to the recovery room in good condition and subsequently to short stay.  It was felt that the patient was doing well enough that he was discharged home from short stay.   Discharge Exam: General appearance: alert, cooperative and no distress Extremities: Homans sign is negative, no sign of DVT, no edema, redness or tenderness in the calves or thighs and no ulcers, gangrene or trophic changes  Disposition: Home with follow up in 2 weeks     Discharge Instructions    Call MD / Call 911    Complete by:  As directed    If you experience chest pain or shortness of breath, CALL 911 and be transported to the hospital emergency room.  If you develope a fever above 101 F, pus (white drainage) or increased drainage or redness at the wound, or calf pain, call your surgeon's office.   Change dressing    Complete by:  As directed    Maintain surgical dressing until follow up in the clinic. If the edges start to pull up, may reinforce with tape. If the dressing is no longer working, may remove and cover with gauze and tape, but must keep the area dry and clean.  Call with any questions or concerns.   Constipation Prevention    Complete by:  As directed    Drink plenty of fluids.  Prune juice may be helpful.  You may use a  stool softener, such as Colace (over the counter) 100 mg twice a day.  Use MiraLax (over the counter) for constipation as needed.   Diet - low sodium heart healthy    Complete by:  As directed    Discharge instructions    Complete by:  As directed    Maintain surgical dressing until follow up in the clinic. If the edges start to pull up, may reinforce with tape. If the dressing is no longer working, may remove and cover  with gauze and tape, but must keep the area dry and clean.  [facility] Call with any questions or concerns.   Increase activity slowly as tolerated    Complete by:  As directed    Weight bearing as tolerated with assist device (walker, cane, etc) as directed, use it as long as suggested by your surgeon or therapist, typically at least 4-6 weeks.   TED hose    Complete by:  As directed    Use stockings (TED hose) for 2 weeks on both leg(s).  You may remove them at night for sleeping.      Allergies as of 04/26/2016      Reactions   Vancomycin Other (See Comments)   Arletha Grippeedman syndrome      Medication List    STOP taking these medications   diclofenac 75 MG EC tablet Commonly known as:  VOLTAREN     TAKE these medications   aspirin 81 MG chewable tablet Chew 1 tablet (81 mg total) by mouth 2 (two) times daily. Take for 4 weeks, then resume regular dose. What changed:  when to take this  additional instructions Notes to patient:  Start tonight.   atorvastatin 80 MG tablet Commonly known as:  LIPITOR Take 80 mg by mouth daily at 6 PM.   cephALEXin 500 MG capsule Commonly known as:  KEFLEX Take 1 capsule (500 mg total) by mouth 3 (three) times daily. Notes to patient:  Try to take every 8 hours . Start tonight.  (For example, 10PM, 6AM, and 2PM).   hydrochlorothiazide 25 MG tablet Commonly known as:  HYDRODIURIL Take 25 mg by mouth daily as needed (fluid retention).   HYDROcodone-acetaminophen 7.5-325 MG tablet Commonly known as:  NORCO Take 1-2 tablets by mouth every 4 (four) hours as needed for moderate pain.   HYDROmorphone 2 MG tablet Commonly known as:  DILAUDID Take 1-2 tablets (2-4 mg total) by mouth every 6 (six) hours as needed for severe pain (breakthrough pain). Notes to patient:  For severe pain only (for pain level of 8,9, or 10).   methocarbamol 500 MG tablet Commonly known as:  ROBAXIN Take 1 tablet (500 mg total) by  mouth every 6 (six) hours as needed for muscle spasms.   metoprolol tartrate 25 MG tablet Commonly known as:  LOPRESSOR Take 25 mg by mouth 2 (two) times daily.   ranitidine 150 MG capsule Commonly known as:  ZANTAC Take 150 mg by mouth every evening.            Durable Medical Equipment        Start     Ordered   04/26/16 1120  For home use only DME Walker rolling  Once    Question:  Patient needs a walker to treat with the following condition  Answer:  Status post hip surgery   04/26/16 1119       Signed: Anastasio AuerbachMatthew S. Kemar Pandit   PA-C  04/26/2016, 3:37  PM  

## 2016-04-27 ENCOUNTER — Encounter (HOSPITAL_COMMUNITY): Payer: Self-pay | Admitting: Orthopedic Surgery

## 2016-09-26 ENCOUNTER — Encounter (HOSPITAL_COMMUNITY): Payer: Self-pay

## 2016-09-26 ENCOUNTER — Encounter (HOSPITAL_COMMUNITY)
Admission: EM | Disposition: A | Discharge status: Home / Self Care | Payer: Self-pay | Source: Home / Self Care | Attending: Neurology

## 2016-09-26 ENCOUNTER — Inpatient Hospital Stay (HOSPITAL_COMMUNITY): Payer: BLUE CROSS/BLUE SHIELD

## 2016-09-26 ENCOUNTER — Emergency Department (HOSPITAL_COMMUNITY): Payer: BLUE CROSS/BLUE SHIELD | Admitting: Anesthesiology

## 2016-09-26 ENCOUNTER — Inpatient Hospital Stay (HOSPITAL_COMMUNITY)
Admission: EM | Admit: 2016-09-26 | Discharge: 2016-10-01 | DRG: 023 | Disposition: A | Discharge status: Home / Self Care | Payer: BLUE CROSS/BLUE SHIELD | Attending: Neurology | Admitting: Neurology

## 2016-09-26 ENCOUNTER — Emergency Department (HOSPITAL_COMMUNITY): Payer: BLUE CROSS/BLUE SHIELD

## 2016-09-26 DIAGNOSIS — I350 Nonrheumatic aortic (valve) stenosis: Secondary | ICD-10-CM | POA: Diagnosis not present

## 2016-09-26 DIAGNOSIS — Y92239 Unspecified place in hospital as the place of occurrence of the external cause: Secondary | ICD-10-CM | POA: Diagnosis not present

## 2016-09-26 DIAGNOSIS — K219 Gastro-esophageal reflux disease without esophagitis: Secondary | ICD-10-CM | POA: Diagnosis present

## 2016-09-26 DIAGNOSIS — E872 Acidosis: Secondary | ICD-10-CM | POA: Diagnosis present

## 2016-09-26 DIAGNOSIS — R339 Retention of urine, unspecified: Secondary | ICD-10-CM | POA: Diagnosis present

## 2016-09-26 DIAGNOSIS — Q251 Coarctation of aorta: Secondary | ICD-10-CM

## 2016-09-26 DIAGNOSIS — I63132 Cerebral infarction due to embolism of left carotid artery: Secondary | ICD-10-CM | POA: Diagnosis not present

## 2016-09-26 DIAGNOSIS — E669 Obesity, unspecified: Secondary | ICD-10-CM | POA: Diagnosis present

## 2016-09-26 DIAGNOSIS — I618 Other nontraumatic intracerebral hemorrhage: Secondary | ICD-10-CM | POA: Diagnosis present

## 2016-09-26 DIAGNOSIS — Z87891 Personal history of nicotine dependence: Secondary | ICD-10-CM

## 2016-09-26 DIAGNOSIS — I63412 Cerebral infarction due to embolism of left middle cerebral artery: Secondary | ICD-10-CM | POA: Diagnosis present

## 2016-09-26 DIAGNOSIS — Z79899 Other long term (current) drug therapy: Secondary | ICD-10-CM

## 2016-09-26 DIAGNOSIS — R4701 Aphasia: Secondary | ICD-10-CM | POA: Diagnosis present

## 2016-09-26 DIAGNOSIS — G8929 Other chronic pain: Secondary | ICD-10-CM | POA: Diagnosis present

## 2016-09-26 DIAGNOSIS — R1310 Dysphagia, unspecified: Secondary | ICD-10-CM | POA: Diagnosis present

## 2016-09-26 DIAGNOSIS — Z96641 Presence of right artificial hip joint: Secondary | ICD-10-CM | POA: Diagnosis present

## 2016-09-26 DIAGNOSIS — I2510 Atherosclerotic heart disease of native coronary artery without angina pectoris: Secondary | ICD-10-CM | POA: Diagnosis present

## 2016-09-26 DIAGNOSIS — I700 Atherosclerosis of aorta: Secondary | ICD-10-CM | POA: Diagnosis present

## 2016-09-26 DIAGNOSIS — I639 Cerebral infarction, unspecified: Secondary | ICD-10-CM | POA: Diagnosis present

## 2016-09-26 DIAGNOSIS — Z7982 Long term (current) use of aspirin: Secondary | ICD-10-CM | POA: Diagnosis not present

## 2016-09-26 DIAGNOSIS — Z4659 Encounter for fitting and adjustment of other gastrointestinal appliance and device: Secondary | ICD-10-CM

## 2016-09-26 DIAGNOSIS — D62 Acute posthemorrhagic anemia: Secondary | ICD-10-CM | POA: Diagnosis not present

## 2016-09-26 DIAGNOSIS — I638 Other cerebral infarction: Secondary | ICD-10-CM | POA: Diagnosis not present

## 2016-09-26 DIAGNOSIS — Z951 Presence of aortocoronary bypass graft: Secondary | ICD-10-CM

## 2016-09-26 DIAGNOSIS — Z9282 Status post administration of tPA (rtPA) in a different facility within the last 24 hours prior to admission to current facility: Secondary | ICD-10-CM | POA: Diagnosis not present

## 2016-09-26 DIAGNOSIS — M1990 Unspecified osteoarthritis, unspecified site: Secondary | ICD-10-CM | POA: Diagnosis present

## 2016-09-26 DIAGNOSIS — G8101 Flaccid hemiplegia affecting right dominant side: Secondary | ICD-10-CM | POA: Diagnosis present

## 2016-09-26 DIAGNOSIS — I6300 Cerebral infarction due to thrombosis of unspecified precerebral artery: Secondary | ICD-10-CM | POA: Diagnosis not present

## 2016-09-26 DIAGNOSIS — E785 Hyperlipidemia, unspecified: Secondary | ICD-10-CM | POA: Diagnosis not present

## 2016-09-26 DIAGNOSIS — I351 Nonrheumatic aortic (valve) insufficiency: Secondary | ICD-10-CM | POA: Diagnosis not present

## 2016-09-26 DIAGNOSIS — R29711 NIHSS score 11: Secondary | ICD-10-CM | POA: Diagnosis present

## 2016-09-26 DIAGNOSIS — Z789 Other specified health status: Secondary | ICD-10-CM | POA: Diagnosis not present

## 2016-09-26 DIAGNOSIS — T783XXA Angioneurotic edema, initial encounter: Secondary | ICD-10-CM | POA: Diagnosis not present

## 2016-09-26 DIAGNOSIS — N359 Urethral stricture, unspecified: Secondary | ICD-10-CM | POA: Diagnosis present

## 2016-09-26 DIAGNOSIS — I10 Essential (primary) hypertension: Secondary | ICD-10-CM | POA: Diagnosis present

## 2016-09-26 DIAGNOSIS — I63232 Cerebral infarction due to unspecified occlusion or stenosis of left carotid arteries: Secondary | ICD-10-CM | POA: Diagnosis not present

## 2016-09-26 DIAGNOSIS — R0689 Other abnormalities of breathing: Secondary | ICD-10-CM | POA: Diagnosis present

## 2016-09-26 DIAGNOSIS — I6522 Occlusion and stenosis of left carotid artery: Secondary | ICD-10-CM | POA: Diagnosis not present

## 2016-09-26 DIAGNOSIS — T45615A Adverse effect of thrombolytic drugs, initial encounter: Secondary | ICD-10-CM | POA: Diagnosis not present

## 2016-09-26 DIAGNOSIS — I252 Old myocardial infarction: Secondary | ICD-10-CM | POA: Diagnosis not present

## 2016-09-26 DIAGNOSIS — Z6831 Body mass index (BMI) 31.0-31.9, adult: Secondary | ICD-10-CM

## 2016-09-26 DIAGNOSIS — J95821 Acute postprocedural respiratory failure: Secondary | ICD-10-CM | POA: Diagnosis not present

## 2016-09-26 DIAGNOSIS — Z881 Allergy status to other antibiotic agents status: Secondary | ICD-10-CM | POA: Diagnosis not present

## 2016-09-26 DIAGNOSIS — I080 Rheumatic disorders of both mitral and aortic valves: Secondary | ICD-10-CM | POA: Diagnosis present

## 2016-09-26 DIAGNOSIS — Z01818 Encounter for other preprocedural examination: Secondary | ICD-10-CM

## 2016-09-26 DIAGNOSIS — R739 Hyperglycemia, unspecified: Secondary | ICD-10-CM | POA: Diagnosis present

## 2016-09-26 DIAGNOSIS — F1010 Alcohol abuse, uncomplicated: Secondary | ICD-10-CM | POA: Diagnosis not present

## 2016-09-26 DIAGNOSIS — R29810 Facial weakness: Secondary | ICD-10-CM | POA: Diagnosis present

## 2016-09-26 HISTORY — PX: RADIOLOGY WITH ANESTHESIA: SHX6223

## 2016-09-26 HISTORY — PX: IR PERCUTANEOUS ART THROMBECTOMY/INFUSION INTRACRANIAL INC DIAG ANGIO: IMG6087

## 2016-09-26 LAB — COMPREHENSIVE METABOLIC PANEL
ALK PHOS: 95 U/L (ref 38–126)
ALT: 28 U/L (ref 17–63)
AST: 34 U/L (ref 15–41)
Albumin: 4.5 g/dL (ref 3.5–5.0)
Anion gap: 12 (ref 5–15)
BILIRUBIN TOTAL: 0.5 mg/dL (ref 0.3–1.2)
BUN: 18 mg/dL (ref 6–20)
CALCIUM: 9.7 mg/dL (ref 8.9–10.3)
CO2: 23 mmol/L (ref 22–32)
Chloride: 107 mmol/L (ref 101–111)
Creatinine, Ser: 1.15 mg/dL (ref 0.61–1.24)
GFR calc Af Amer: >60 mL/min (ref >60)
Glucose, Bld: 132 mg/dL — ABNORMAL HIGH (ref 65–99)
POTASSIUM: 3.6 mmol/L (ref 3.5–5.1)
Sodium: 142 mmol/L (ref 135–145)
TOTAL PROTEIN: 8 g/dL (ref 6.5–8.1)

## 2016-09-26 LAB — I-STAT CHEM 8, ED
BUN: 18 mg/dL (ref 6–20)
CREATININE: 1.2 mg/dL (ref 0.61–1.24)
Calcium, Ion: 1.14 mmol/L — ABNORMAL LOW (ref 1.15–1.40)
Chloride: 107 mmol/L (ref 101–111)
Glucose, Bld: 131 mg/dL — ABNORMAL HIGH (ref 65–99)
HCT: 40 % (ref 39.0–52.0)
HEMOGLOBIN: 13.6 g/dL (ref 13.0–17.0)
Potassium: 3.6 mmol/L (ref 3.5–5.1)
Sodium: 143 mmol/L (ref 135–145)
TCO2: 23 mmol/L (ref 0–100)

## 2016-09-26 LAB — CBC
HEMATOCRIT: 39.3 % (ref 39.0–52.0)
HEMOGLOBIN: 13 g/dL (ref 13.0–17.0)
MCH: 27.7 pg (ref 26.0–34.0)
MCHC: 33.1 g/dL (ref 30.0–36.0)
MCV: 83.6 fL (ref 78.0–100.0)
Platelets: 324 10*3/uL (ref 150–400)
RBC: 4.7 MIL/uL (ref 4.22–5.81)
RDW: 14.7 % (ref 11.5–15.5)
WBC: 11.4 10*3/uL — ABNORMAL HIGH (ref 4.0–10.5)

## 2016-09-26 LAB — PROTIME-INR
INR: 0.98
Prothrombin Time: 13 seconds (ref 11.4–15.2)

## 2016-09-26 LAB — DIFFERENTIAL
BASOS ABS: 0 10*3/uL (ref 0.0–0.1)
Basophils Relative: 0 %
EOS ABS: 0.1 10*3/uL (ref 0.0–0.7)
EOS PCT: 1 %
LYMPHS ABS: 2.6 10*3/uL (ref 0.7–4.0)
Lymphocytes Relative: 22 %
MONOS PCT: 8 %
Monocytes Absolute: 0.9 10*3/uL (ref 0.1–1.0)
Neutro Abs: 7.8 10*3/uL — ABNORMAL HIGH (ref 1.7–7.7)
Neutrophils Relative %: 69 %

## 2016-09-26 LAB — APTT: aPTT: 28 seconds (ref 24–36)

## 2016-09-26 LAB — I-STAT TROPONIN, ED: Troponin i, poc: 0.01 ng/mL (ref 0.00–0.08)

## 2016-09-26 LAB — ETHANOL: Alcohol, Ethyl (B): <5 mg/dL (ref <5)

## 2016-09-26 SURGERY — RADIOLOGY WITH ANESTHESIA
Anesthesia: General

## 2016-09-26 MED ORDER — SODIUM CHLORIDE 0.9 % IV SOLN
INTRAVENOUS | Status: DC | PRN
  Administered 2016-09-26: 22:00:00 via INTRAVENOUS

## 2016-09-26 MED ORDER — ACETAMINOPHEN 160 MG/5ML PO SOLN: 650.0000 mg | ORAL | Status: DC | PRN

## 2016-09-26 MED ORDER — IOPAMIDOL (ISOVUE-370) INJECTION 76%
100.0000 mL | Freq: Once | INTRAVENOUS | Status: AC | PRN
  Administered 2016-09-26: 100 mL via INTRAVENOUS

## 2016-09-26 MED ORDER — FAMOTIDINE IN NACL 20-0.9 MG/50ML-% IV SOLN
20.0000 mg | Freq: Once | INTRAVENOUS | Status: AC
Start: 2016-09-26 — End: 2016-09-26
  Administered 2016-09-26: 20 mg via INTRAVENOUS

## 2016-09-26 MED ORDER — ALTEPLASE 100 MG IV SOLR
INTRAVENOUS | Status: AC
  Filled 2016-09-26: qty 100

## 2016-09-26 MED ORDER — LABETALOL HCL 5 MG/ML IV SOLN
INTRAVENOUS | Status: AC
  Filled 2016-09-26: qty 4

## 2016-09-26 MED ORDER — PHENYLEPHRINE HCL 10 MG/ML IJ SOLN
INTRAVENOUS | Status: DC | PRN
  Administered 2016-09-26: 50 ug/min via INTRAVENOUS

## 2016-09-26 MED ORDER — LABETALOL HCL 5 MG/ML IV SOLN
INTRAVENOUS | Status: DC | PRN
  Administered 2016-09-26 (×2): 10 mg via INTRAVENOUS

## 2016-09-26 MED ORDER — EPHEDRINE 5 MG/ML INJ
INTRAVENOUS | Status: AC
  Filled 2016-09-26: qty 10

## 2016-09-26 MED ORDER — ALTEPLASE (STROKE) FULL DOSE INFUSION
90.0000 mg | Freq: Once | INTRAVENOUS | Status: AC
  Administered 2016-09-26: 90 mg via INTRAVENOUS
  Filled 2016-09-26: qty 100

## 2016-09-26 MED ORDER — SUCCINYLCHOLINE 20MG/ML (10ML) SYRINGE FOR MEDFUSION PUMP - OPTIME
INTRAMUSCULAR | Status: DC | PRN
  Administered 2016-09-26: 100 mg via INTRAVENOUS

## 2016-09-26 MED ORDER — PHENYLEPHRINE 40 MCG/ML (10ML) SYRINGE FOR IV PUSH (FOR BLOOD PRESSURE SUPPORT)
PREFILLED_SYRINGE | INTRAVENOUS | Status: AC
  Filled 2016-09-26: qty 10

## 2016-09-26 MED ORDER — METHYLPREDNISOLONE SODIUM SUCC 125 MG IJ SOLR
125.0000 mg | Freq: Once | INTRAMUSCULAR | Status: AC
  Administered 2016-09-26: 125 mg via INTRAVENOUS

## 2016-09-26 MED ORDER — SODIUM CHLORIDE 0.9 % IV SOLN
50.0000 mL | Freq: Once | INTRAVENOUS | Status: AC
  Administered 2016-09-26: 50 mL via INTRAVENOUS

## 2016-09-26 MED ORDER — ROCURONIUM 10MG/ML (10ML) SYRINGE FOR MEDFUSION PUMP - OPTIME
INTRAVENOUS | Status: DC | PRN
  Administered 2016-09-26 (×2): 50 mg via INTRAVENOUS

## 2016-09-26 MED ORDER — NITROGLYCERIN 1 MG/10 ML FOR IR/CATH LAB
INTRA_ARTERIAL | Status: AC
  Filled 2016-09-26: qty 10

## 2016-09-26 MED ORDER — SODIUM CHLORIDE 0.9 % IV SOLN: 250.0000 mL | INTRAVENOUS | Status: DC | PRN

## 2016-09-26 MED ORDER — SODIUM CHLORIDE 0.9 % IV SOLN
INTRAVENOUS | Status: AC
  Filled 2016-09-26: qty 750

## 2016-09-26 MED ORDER — PROPOFOL 500 MG/50ML IV EMUL
INTRAVENOUS | Status: DC | PRN
  Administered 2016-09-26: 50 ug/kg/min via INTRAVENOUS

## 2016-09-26 MED ORDER — HYDROCHLOROTHIAZIDE 25 MG PO TABS: 25.0000 mg | ORAL_TABLET | Freq: Every day | ORAL | Status: DC | PRN

## 2016-09-26 MED ORDER — FENTANYL CITRATE (PF) 100 MCG/2ML IJ SOLN
INTRAMUSCULAR | Status: DC | PRN
  Administered 2016-09-26: 50 ug via INTRAVENOUS
  Administered 2016-09-26: 150 ug via INTRAVENOUS
  Administered 2016-09-26: 100 ug via INTRAVENOUS

## 2016-09-26 MED ORDER — METOPROLOL TARTRATE 25 MG PO TABS: 25.0000 mg | ORAL_TABLET | Freq: Two times a day (BID) | ORAL | Status: DC

## 2016-09-26 MED ORDER — LIDOCAINE 2% (20 MG/ML) 5 ML SYRINGE
INTRAMUSCULAR | Status: AC
  Filled 2016-09-26: qty 5

## 2016-09-26 MED ORDER — ACETAMINOPHEN 650 MG RE SUPP: 650.0000 mg | RECTAL | Status: DC | PRN

## 2016-09-26 MED ORDER — IOPAMIDOL (ISOVUE-300) INJECTION 61%
INTRAVENOUS | Status: AC
  Administered 2016-09-26: 100 mL
  Filled 2016-09-26: qty 300

## 2016-09-26 MED ORDER — ROCURONIUM BROMIDE 10 MG/ML (PF) SYRINGE
PREFILLED_SYRINGE | INTRAVENOUS | Status: AC
  Filled 2016-09-26: qty 5

## 2016-09-26 MED ORDER — SENNOSIDES-DOCUSATE SODIUM 8.6-50 MG PO TABS
1.0000 | ORAL_TABLET | Freq: Every evening | ORAL | Status: DC | PRN
  Filled 2016-09-26: qty 1

## 2016-09-26 MED ORDER — ACETAMINOPHEN 325 MG PO TABS: 650.0000 mg | ORAL_TABLET | ORAL | Status: DC | PRN

## 2016-09-26 MED ORDER — PANTOPRAZOLE SODIUM 40 MG IV SOLR
40.0000 mg | Freq: Every day | INTRAVENOUS | Status: DC
  Administered 2016-09-27: 40 mg via INTRAVENOUS
  Filled 2016-09-26: qty 40

## 2016-09-26 MED ORDER — ATORVASTATIN CALCIUM 80 MG PO TABS: 80.0000 mg | ORAL_TABLET | Freq: Every day | ORAL | Status: DC

## 2016-09-26 MED ORDER — EPTIFIBATIDE 20 MG/10ML IV SOLN
INTRAVENOUS | Status: AC
  Administered 2016-09-26 (×5): 25 ug
  Filled 2016-09-26: qty 10

## 2016-09-26 MED ORDER — SUCCINYLCHOLINE CHLORIDE 200 MG/10ML IV SOSY
PREFILLED_SYRINGE | INTRAVENOUS | Status: AC
Start: 2016-09-26 — End: 2016-09-26
  Filled 2016-09-26: qty 10

## 2016-09-26 MED ORDER — DIPHENHYDRAMINE HCL 50 MG/ML IJ SOLN
50.0000 mg | Freq: Once | INTRAMUSCULAR | Status: AC
  Administered 2016-09-26: 25 mg via INTRAVENOUS

## 2016-09-26 MED ORDER — CEFAZOLIN SODIUM-DEXTROSE 2-3 GM-% IV SOLR
INTRAVENOUS | Status: DC | PRN
  Administered 2016-09-26: 2 g via INTRAVENOUS

## 2016-09-26 MED ORDER — CEFAZOLIN SODIUM-DEXTROSE 2-4 GM/100ML-% IV SOLN
INTRAVENOUS | Status: AC
  Filled 2016-09-26: qty 100

## 2016-09-26 MED ORDER — PROPOFOL 10 MG/ML IV BOLUS
INTRAVENOUS | Status: DC | PRN
  Administered 2016-09-26: 60 mg via INTRAVENOUS
  Administered 2016-09-26: 130 mg via INTRAVENOUS

## 2016-09-26 MED ORDER — STROKE: EARLY STAGES OF RECOVERY BOOK
Freq: Once | Status: AC
  Administered 2016-09-29: 17:00:00
  Filled 2016-09-26 (×2): qty 1

## 2016-09-26 MED ORDER — SODIUM CHLORIDE 0.9 % IV SOLN: INTRAVENOUS | Status: AC

## 2016-09-26 MED ORDER — LIDOCAINE HCL (CARDIAC) 20 MG/ML IV SOLN
INTRAVENOUS | Status: DC | PRN
  Administered 2016-09-26: 60 mg via INTRATRACHEAL

## 2016-09-26 NOTE — H&P (Addendum)
Referring Physician: Dr. Dayna Barker    Chief Complaint: Acute aphasia with flaccid right sided weakness  HPI: Johnny Sullivan is an 61 y.o. male who presented to [facility] with stroke symptoms. LKN was 1815 this evening. 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 When found he was unable to talk. Per [facility] ED physician, the patient was aphasic with flaccid right sided weakness.   CT head revealed an emergent large vessel occlusion affecting the left ICA terminus and proximal left MCA with associated early hypoattenuation of the left insula and left anterolateral temporal cortex. ASPECTS was 8.  Teleneurology consultation was obtained and IV tPA was recommended, followed by a STAT CTA head and emergent transfer to [facility] for possible catheter based clot retrieval.   CTA completed at 8:09 PM, revealing a left ICA occlusion extending from just distal to the carotid bifurcation to just proximal to the carotid terminus. A short occluded segment of the proximal left MCA is also noted (preliminary interpretation by neurologist).   EKG at OSH showed no atrial fibrillation.   Per his wife, he takes daily aspirin and is on Lipitor 80 mg po qd. Formerly was on DAPT with Plavix and ASA for 2 years following previous CABG surgery.   LSN: [id number] tPA Given: Yes, at outside hospital  Past Medical History:  Diagnosis Date  . Arthritis    Osteoarthritis- right hip  . Coronary artery disease   . GERD (gastroesophageal reflux disease)   . Hypertension   . Myocardial infarction ([place])    " mild"-Dr. Debbora Lacrosse follows -gives clearance.  Marland Kitchen Urethral stricture    'uses self [place] caths weekly" to correct.    Past Surgical History:  Procedure Laterality Date  . BACK SURGERY     lumbar discectomy  . CARDIAC CATHETERIZATION    . CORONARY ARTERY BYPASS GRAFT     09-20-2012 x3 -[place]  . TOTAL HIP ARTHROPLASTY Right  04/26/2016   Procedure: RIGHT TOTAL HIP ARTHROPLASTY ANTERIOR APPROACH;  Surgeon: Paralee Cancel, MD;  Location: [facility] ORS;  Service: Orthopedics;  Laterality: Right;  Marland Kitchen VASECTOMY    . WRIST SURGERY     "implant of plastic bone"-mild ROM limits    History reviewed. No pertinent family history. Social History:  reports that he quit smoking about 4 years ago. His smoking use included Cigarettes. He has a 22.50 pack-year smoking history. He quit smokeless tobacco use about 4 years ago. His smokeless tobacco use included Chew. He reports that he drinks alcohol. He reports that he does not use drugs.  Allergies:  Allergies  Allergen Reactions  . Vancomycin Other (See Comments)    Redman syndrome    Medications:  Prior to Admission:  Prescriptions Prior to Admission  Medication Sig Dispense Refill Last Dose  . atorvastatin (LIPITOR) 80 MG tablet Take 80 mg by mouth daily at 6 PM.   3 09/25/2016 at Unknown time  . hydrochlorothiazide (HYDRODIURIL) 25 MG tablet Take 25 mg by mouth daily as needed (fluid retention).   3 09/25/2016 at Unknown time  . metoprolol tartrate (LOPRESSOR) 25 MG tablet Take 25 mg by mouth 2 (two) times daily.   3 09/25/2016 at Unknown time  . ranitidine (ZANTAC) 150 MG capsule Take 150 mg by mouth every evening.   09/25/2016 at Unknown time  . amoxicillin (AMOXIL) 500 MG capsule Take 2,000 mg by mouth See admin instructions. ONE HOUR PRIOR TO DENTAL APPOINTMENT  0  ROS: Unable to obtain ROS due to aphasia.   Physical Examination: Blood pressure (!) 173/83, pulse 94, temperature 98.6 F (37 C), temperature source Oral, resp. rate 19, height 5' 9"  (1.753 m), weight 104.3 kg (230 lb), SpO2 98 %.  HEENT: [place]/AT. Swelling of right half of tongue is noted.  Lungs: Respirations unlabored Ext: Warm and well perfused  Neurologic Examination: Mental Status: Awake. Mild agitation. Dense expressive and receptive aphasia. Able to follow approximately 30% of simple one-step motor  commands. Limited attempts at speech output are dysrarthric.  Cranial Nerves: II:  No blink to threat on the right. PERRL. III,IV, VI: ptosis not present, able to track to left and right conjugately. No nystagmus.  V,VII: Right facial droop. Reacts to stimulation grossly on left and right sides of face VIII: Gazes towards auditory stimuli IX,X: Unable to visualize palate; nasal quality to voice is noted XI: Able to rotate head to left and right without gross asymmetry XII: Tongue deviates to left when protruded, unclear if this is due to swelling of right half of tongue or due to weakness Motor: RUE: Initially 4/5 with increased latency of motor response; decreases to 2/5 just prior to transport to VIR RLE:  Initially 5/5; decreases to 2/5 just prior to transport to VIR LUE and LLE: 5/5 Bulk is normal.  Sensory: Reacts to pinch all 4 extremities, somewhat more briskly on left.  Deep Tendon Reflexes: Normoactive x 4 without definite asymmetry Plantars: Right: downgoing   Left: downgoing Cerebellar: No ataxia disproportionate to weakness when following motor commands. Not comprehending instructions for performance of FNF testing.  Gait: Deferred  Results for orders placed or performed during the hospital encounter of 09/26/16 (from the past 48 hour(s))  Ethanol     Status: None   Collection Time: 09/26/16  6:51 PM  Result Value Ref Range   Alcohol, Ethyl (B) <5 <5 mg/dL    Comment:        LOWEST DETECTABLE LIMIT FOR SERUM ALCOHOL IS 5 mg/dL FOR MEDICAL PURPOSES ONLY   Protime-INR     Status: None   Collection Time: 09/26/16  6:51 PM  Result Value Ref Range   Prothrombin Time 13.0 11.4 - 15.2 seconds   INR 0.98   APTT     Status: None   Collection Time: 09/26/16  6:51 PM  Result Value Ref Range   aPTT 28 24 - 36 seconds  CBC     Status: Abnormal   Collection Time: 09/26/16  6:51 PM  Result Value Ref Range   WBC 11.4 (H) 4.0 - 10.5 K/uL   RBC 4.70 4.22 - 5.81 MIL/uL    Hemoglobin 13.0 13.0 - 17.0 g/dL   HCT 39.3 39.0 - 52.0 %   MCV 83.6 78.0 - 100.0 fL   MCH 27.7 26.0 - 34.0 pg   MCHC 33.1 30.0 - 36.0 g/dL   RDW 14.7 11.5 - 15.5 %   Platelets 324 150 - 400 K/uL  Differential     Status: Abnormal   Collection Time: 09/26/16  6:51 PM  Result Value Ref Range   Neutrophils Relative % 69 %   Neutro Abs 7.8 (H) 1.7 - 7.7 K/uL   Lymphocytes Relative 22 %   Lymphs Abs 2.6 0.7 - 4.0 K/uL   Monocytes Relative 8 %   Monocytes Absolute 0.9 0.1 - 1.0 K/uL   Eosinophils Relative 1 %   Eosinophils Absolute 0.1 0.0 - 0.7 K/uL   Basophils Relative 0 %  Basophils Absolute 0.0 0.0 - 0.1 K/uL  Comprehensive metabolic panel     Status: Abnormal   Collection Time: 09/26/16  6:51 PM  Result Value Ref Range   Sodium 142 135 - 145 mmol/L   Potassium 3.6 3.5 - 5.1 mmol/L   Chloride 107 101 - 111 mmol/L   CO2 23 22 - 32 mmol/L   Glucose, Bld 132 (H) 65 - 99 mg/dL   BUN 18 6 - 20 mg/dL   Creatinine, Ser 1.15 0.61 - 1.24 mg/dL   Calcium 9.7 8.9 - 10.3 mg/dL   Total Protein 8.0 6.5 - 8.1 g/dL   Albumin 4.5 3.5 - 5.0 g/dL   AST 34 15 - 41 U/L   ALT 28 17 - 63 U/L   Alkaline Phosphatase 95 38 - 126 U/L   Total Bilirubin 0.5 0.3 - 1.2 mg/dL   GFR calc non Af Amer >60 >60 mL/min   GFR calc Af Amer >60 >60 mL/min    Comment: (NOTE) The eGFR has been calculated using the CKD EPI equation. This calculation has not been validated in all clinical situations. eGFR's persistently <60 mL/min signify possible Chronic Kidney Disease.    Anion gap 12 5 - 15  I-stat troponin, ED (not at [facility], [facility])     Status: None   Collection Time: 09/26/16  7:08 PM  Result Value Ref Range   Troponin i, poc 0.01 0.00 - 0.08 ng/mL   Comment 3            Comment: Due to the release kinetics of cTnI, a negative result within the first hours of the onset of symptoms does not rule out myocardial infarction with certainty. If myocardial infarction is still suspected, repeat the test at  appropriate intervals.   I-Stat Chem 8, ED  (not at [facility], [facility])     Status: Abnormal   Collection Time: 09/26/16  7:10 PM  Result Value Ref Range   Sodium 143 135 - 145 mmol/L   Potassium 3.6 3.5 - 5.1 mmol/L   Chloride 107 101 - 111 mmol/L   BUN 18 6 - 20 mg/dL   Creatinine, Ser 1.20 0.61 - 1.24 mg/dL   Glucose, Bld 131 (H) 65 - 99 mg/dL   Calcium, Ion 1.14 (L) 1.15 - 1.40 mmol/L   TCO2 23 0 - 100 mmol/L   Hemoglobin 13.6 13.0 - 17.0 g/dL   HCT 40.0 39.0 - 52.0 %   Ct Head Wo Contrast  Result Date: 09/26/2016 CLINICAL DATA:  Code stroke. Passed out earlier today. Unable to talk and move RIGHT side. EXAM: CT HEAD WITHOUT CONTRAST TECHNIQUE: Contiguous axial images were obtained from the base of the skull through the vertex without intravenous contrast. COMPARISON:  None. FINDINGS: The patient was unable to remain motionless for the exam. Small or subtle lesions could be overlooked. Brain: There is asymmetric hypoattenuation of the LEFT anterolateral temporal lobe and insula consistent with early developing infarction. No other areas of concern for stroke are seen. There is no hemorrhage, mass lesion, hydrocephalus, or extra-axial fluid. Normal for age cerebral volume. Mild hypoattenuation of white matter could represent small vessel disease. Vascular: Abnormal hyperattenuation of the LEFT ICA terminus and proximal LEFT M1 MCA representing emergent large vessel occlusion. Skull: Normal. Negative for fracture or focal lesion. Sinuses/Orbits: No acute finding. Other: None. ASPECTS [facility] Stroke Program Early CT Score) - Ganglionic level infarction (caudate, lentiform nuclei, internal capsule, insula, M1-M3 cortex): 5, loss of insula and M2 cortex. - Supraganglionic  infarction (M4-M6 cortex): 3 Total score (0-10 with 10 being normal): 8 IMPRESSION: 1. Emergent large vessel occlusion affecting the LEFT ICA terminus and proximal LEFT MCA. 2. ASPECTS is 8, early hypoattenuation of the LEFT insula and  LEFT anterolateral temporal cortex. These results were called by telephone at the time of interpretation on 09/26/2016 at 7:12 pm to Dr. Merrily Pew , who verbally acknowledged these results. Electronically Signed   By: Staci Righter M.D.   On: 09/26/2016 19:19    Assessment: 61 y.o. 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 Status post IV tPA at OSH. Initially improved after initiation of tPA per family, now with worsening RUE and RLE weakness.  2. NIHSS of 11 3. Within interventional time window without absolute contraindications 4. Stroke Risk Factors - CAD, MI, HTN 5. Angioedema involving the right half of the tongue. Most likely secondary to allergic reaction to tPA.  6. History of urethral stricture. Self-catheterizes weekly at home to maintain patency. At risk for urinary retention during hospital admission.   Plan: 1. Discussed case with Dr. Estanislado Pandy of [place]. He is an interventional candidate 2. Post-tPA BP management with goal < 180/105. Other post-tPA management per order set.  3. MRI, MRA of the brain without contrast on Tuesday 4. Echocardiogram 5. Carotid dopplers 6. No antiplatelet medications or anticoagulants for at least 24 hours following tPA. Can consider if repeat CT at 24 hours is negative for hemorrhagic conversion.  7. Risk factor modification 8. Telemetry monitoring 9. Frequent neuro checks 10. Continue atorvastatin at 80 mg po qd.  11. Methylprednisolone, Protonix and Benadryl have been oredered by pharmacy for angioedema 12. Per family, he is Full Code 13. Hold off on Foley catheterization for now, as risks of trauma with hemorrhage outweigh benefits for the first 24 hr after tPA. Will need serial bladder scans for the first 24 hours.  14. PT consult, OT consult, Speech consult  45 minutes of critical care time spent in the Neurological evaluation and management of this acute stroke  patient.   @Electronically  signed: Dr. Kerney Elbe  09/26/2016, 8:14 PM

## 2016-09-26 NOTE — Consult Note (Signed)
PULMONARY / CRITICAL CARE MEDICINE   Name: Johnny Sullivan MRN: [id number] DOB: 14-Apr-1956    ADMISSION DATE:  09/26/2016 CONSULTATION DATE:  09/26/2016  REFERRING MD:  Dr. Otelia Limes   CHIEF COMPLAINT:  CVA  HISTORY OF PRESENT ILLNESS:   61 year old male with PMH of CAD s/p CABG 2014, GERD, HTN, MI, and urethral stricture, recent admission in December 2017 for right total hip replacement.   Presents to [employer] on 5/21 as a Code Stroke. 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 CT revealed Left ICA and Prox Left MCA occlusions. Patient arrived to [facility] at 2048 with noted swelling to right side of his tongue > given solumedrol, benadryl, and pepcid. Patient was intubated and take to IR for left common carotid arteriogram followed by endovascular revascularization. PCCM asked to consult for medical/vent management.    PAST MEDICAL HISTORY :  He  has a past medical history of Arthritis; Coronary artery disease; GERD (gastroesophageal reflux disease); Hypertension; Myocardial infarction [facility]); and Urethral stricture.  PAST SURGICAL HISTORY: He  has a past surgical history that includes Cardiac catheterization; Coronary artery bypass graft; Back surgery; Wrist surgery; Vasectomy; and Total hip arthroplasty (Right, 04/26/2016).  Allergies  Allergen Reactions  . Vancomycin Other (See Comments)    Redman syndrome    No current facility-administered medications on file prior to encounter.    Current Outpatient Prescriptions on File Prior to Encounter  Medication Sig  . atorvastatin (LIPITOR) 80 MG tablet Take 80 mg by mouth daily at 6 PM.   . hydrochlorothiazide (HYDRODIURIL) 25 MG tablet Take 25 mg by mouth daily as needed (fluid retention).   . metoprolol tartrate (LOPRESSOR) 25 MG tablet Take 25 mg by mouth 2 (two) times daily.   . ranitidine  (ZANTAC) 150 MG capsule Take 150 mg by mouth every evening.    FAMILY HISTORY:  His has no family status information on file.    SOCIAL HISTORY: He  reports that he quit smoking about 4 years ago. His smoking use included Cigarettes. He has a 22.50 pack-year smoking history. He quit smokeless tobacco use about 4 years ago. His smokeless tobacco use included Chew. He reports that he drinks alcohol. He reports that he does not use drugs.  REVIEW OF SYSTEMS:   Unable to review as patient is intubated and sedated   SUBJECTIVE:  Intubated on propofol gtt.   VITAL SIGNS: BP (!) 145/80   Pulse 79   Temp 97.9 F (36.6 C) (Oral)   Resp 20   Ht 5\' 9"  (1.753 m)   Wt 104.3 kg (230 lb)   SpO2 100%   BMI 33.97 kg/m   HEMODYNAMICS:    VENTILATOR SETTINGS:    INTAKE / OUTPUT: No intake/output data recorded.  PHYSICAL EXAMINATION: General:  Adult male no distress  Neuro:  Sedated, withdrawals left side, moves toes on right side no movement noted to right upper extremity  HEENT:  ETT in place, > bleeding noted to tongue, no swelling/edema noted to face, tongue slightly enlarged   Cardiovascular:  RRR, no MRG, NI S1/S2 Lungs:  Sullivan, non-labored  Abdomen:  Obese, active bowel sounds  Musculoskeletal:  No acute  Skin:  Warm, dry   LABS:  BMET  Recent Labs Lab 09/26/16 1851 09/26/16 1910  NA 142 143  K 3.6 3.6  CL 107 107  CO2 23  --   BUN 18 18  CREATININE 1.15 1.20  GLUCOSE 132* 131*    Electrolytes  Recent Labs Lab 09/26/16 1851  CALCIUM 9.7    CBC  Recent Labs Lab 09/26/16 1851 09/26/16 1910  WBC 11.4*  --   HGB 13.0 13.6  HCT 39.3 40.0  PLT 324  --     Coag's  Recent Labs Lab 09/26/16 1851  APTT 28  INR 0.98    Sepsis Markers No results for input(s): LATICACIDVEN, PROCALCITON, O2SATVEN in the last 168 hours.  ABG No results for input(s): PHART, PCO2ART, PO2ART in the last 168 hours.  Liver Enzymes  Recent Labs Lab 09/26/16 1851   AST 34  ALT 28  ALKPHOS 95  BILITOT 0.5  ALBUMIN 4.5    Cardiac Enzymes No results for input(s): TROPONINI, PROBNP in the last 168 hours.  Glucose No results for input(s): GLUCAP in the last 168 hours.  Imaging Ct Angio Head W Or Wo Contrast  Addendum Date: 09/26/2016   ADDENDUM REPORT: 09/26/2016 21:24 ADDENDUM: Acute findings discussed with and reconfirmed by Dr.Lindzen, Neurology on 09/26/2016 at 9:20 pm. Electronically Signed   By: Awilda Metro M.D.   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 EXAM: CT ANGIOGRAPHY HEAD AND NECK TECHNIQUE: Multidetector CT imaging of the head and neck was performed using the standard protocol during bolus administration of intravenous contrast. Multiplanar CT image reconstructions and MIPs were obtained to evaluate the vascular anatomy. Carotid stenosis measurements (when applicable) are obtained utilizing NASCET criteria, using the distal internal carotid diameter as the denominator. CONTRAST:  100 cc Isovue 370 COMPARISON:  CT HEAD Sep 26, 2016 1853 hours FINDINGS: CTA NECK AORTIC ARCH: Normal appearance of the thoracic arch, 2 vessel arch is a normal variant. Mild calcific atherosclerosis of the aortic arch. The origins of the innominate, left Common carotid artery and subclavian artery are widely patent. RIGHT CAROTID SYSTEM: Common carotid artery is widely patent. Normal appearance of the carotid bifurcation without hemodynamically significant stenosis by NASCET criteria, moderate calcific atherosclerosis. Normal appearance of the included internal carotid artery. LEFT CAROTID SYSTEM: Common carotid artery is widely patent. Normal appearance of the carotid bifurcation without hemodynamically significant stenosis by NASCET criteria, moderate calcific atherosclerosis. Central thrombus and occluded LEFT internal carotid artery within 17 mm of the  origin. VERTEBRAL ARTERIES:Left vertebral artery is dominant. Patent vertebral artery's with extrinsic compression due to degenerative cervical spine. SKELETON: No acute osseous process though bone windows have not been submitted. Status post median sternotomy. OTHER NECK: Soft tissues of the neck are non-acute though, not tailored for evaluation. Severe mid cervical degenerative change resulting and severe bilateral C3-4, LEFT C4-5, bilateral C5-6 and moderate to severe C6-7 neural foraminal narrowing. CTA HEAD ANTERIOR CIRCULATION: LEFT internal carotid artery is occluded to the level of the carotid terminus, with focal central thrombus. Patent anterior communicating artery with diminutive anterior cerebral artery, LEFT A1 segment is patent. Occluded LEFT M1 origin with mid to distal LEFT M1 thready collateralization and intermittent occlusion. No contrast extravasation or aneurysm. POSTERIOR CIRCULATION: Patent vertebral arteries, vertebrobasilar junction and basilar artery, as well as main branch vessels. Patent posterior cerebral arteries. Robust RIGHT posterior communicating artery present. No large vessel occlusion, hemodynamically significant stenosis, dissection, luminal irregularity, contrast extravasation or aneurysm. VENOUS SINUSES: Major dural venous sinuses are patent though not tailored for  evaluation on this angiographic examination. ANATOMIC VARIANTS: Fetal origin RIGHT posterior cerebral artery. DELAYED PHASE: Faint posterior insular enhancement. MIP images reviewed. IMPRESSION: CTA NECK:  Acute LEFT internal carotid artery occlusion. Atherosclerosis without hemodynamically significant stenosis. Multilevel severe neural foraminal narrowing. CTA HEAD: Occluded LEFT ICA with reconstitution LEFT carotid terminus. Occluded proximal LEFT M1 segment with thready collateralization. Diminutive RIGHT anterior cerebral artery favoring normal variant. Critical Value/emergent results were called by telephone  at the time of interpretation on 09/26/2016 at 8:42 pm to Dr. Marily Memos , who verbally acknowledged these results. Dr. Otelia Limes, Neurology paged via [facility] secure hospital paging system on 09/26/2016 at 8:45 pm, awaiting return call. Electronically Signed: By: Awilda Metro M.D. On: 09/26/2016 20:52   Ct Head Wo Contrast  Result Date: 09/26/2016 CLINICAL DATA:  Code stroke. Passed out earlier today. Unable to talk and move RIGHT side. EXAM: CT HEAD WITHOUT CONTRAST TECHNIQUE: Contiguous axial images were obtained from the base of the skull through the vertex without intravenous contrast. COMPARISON:  None. FINDINGS: The patient was unable to remain motionless for the exam. Small or subtle lesions could be overlooked. Brain: There is asymmetric hypoattenuation of the LEFT anterolateral temporal lobe and insula consistent with early developing infarction. No other areas of concern for stroke are seen. There is no hemorrhage, mass lesion, hydrocephalus, or extra-axial fluid. Normal for age cerebral volume. Mild hypoattenuation of white matter could represent small vessel disease. Vascular: Abnormal hyperattenuation of the LEFT ICA terminus and proximal LEFT M1 MCA representing emergent large vessel occlusion. Skull: Normal. Negative for fracture or focal lesion. Sinuses/Orbits: No acute finding. Other: None. ASPECTS [facility] Stroke Program Early CT Score) - Ganglionic level infarction (caudate, lentiform nuclei, internal capsule, insula, M1-M3 cortex): 5, loss of insula and M2 cortex. - Supraganglionic infarction (M4-M6 cortex): 3 Total score (0-10 with 10 being normal): 8 IMPRESSION: 1. Emergent large vessel occlusion affecting the LEFT ICA terminus and proximal LEFT MCA. 2. ASPECTS is 8, early hypoattenuation of the LEFT insula and LEFT anterolateral temporal cortex. These results were called by telephone at the time of interpretation on 09/26/2016 at 7:12 pm to Dr. Marily Memos , who verbally acknowledged  these results. Electronically Signed   By: Elsie Stain M.D.   On: 09/26/2016 19:19   Ct Angio Neck W And/or Wo Contrast  Addendum Date: 09/26/2016   ADDENDUM REPORT: 09/26/2016 21:24 ADDENDUM: Acute findings discussed with and reconfirmed by Dr.Lindzen, Neurology on 09/26/2016 at 9:20 pm. Electronically Signed   By: Awilda Metro M.D.   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 EXAM: CT ANGIOGRAPHY HEAD AND NECK TECHNIQUE: Multidetector CT imaging of the head and neck was performed using the standard protocol during bolus administration of intravenous contrast. Multiplanar CT image reconstructions and MIPs were obtained to evaluate the vascular anatomy. Carotid stenosis measurements (when applicable) are obtained utilizing NASCET criteria, using the distal internal carotid diameter as the denominator. CONTRAST:  100 cc Isovue 370 COMPARISON:  CT HEAD Sep 26, 2016 1853 hours FINDINGS: CTA NECK AORTIC ARCH: Normal appearance of the thoracic arch, 2 vessel arch is a normal variant. Mild calcific atherosclerosis of the aortic arch. The origins of the innominate, left Common carotid artery and subclavian artery are widely patent. RIGHT CAROTID SYSTEM: Common carotid artery is widely patent. Normal appearance of the carotid bifurcation without hemodynamically significant stenosis by NASCET criteria, moderate calcific atherosclerosis. Normal  appearance of the included internal carotid artery. LEFT CAROTID SYSTEM: Common carotid artery is widely patent. Normal appearance of the carotid bifurcation without hemodynamically significant stenosis by NASCET criteria, moderate calcific atherosclerosis. Central thrombus and occluded LEFT internal carotid artery within 17 mm of the origin. VERTEBRAL ARTERIES:Left vertebral artery is dominant. Patent vertebral artery's with extrinsic compression due to degenerative  cervical spine. SKELETON: No acute osseous process though bone windows have not been submitted. Status post median sternotomy. OTHER NECK: Soft tissues of the neck are non-acute though, not tailored for evaluation. Severe mid cervical degenerative change resulting and severe bilateral C3-4, LEFT C4-5, bilateral C5-6 and moderate to severe C6-7 neural foraminal narrowing. CTA HEAD ANTERIOR CIRCULATION: LEFT internal carotid artery is occluded to the level of the carotid terminus, with focal central thrombus. Patent anterior communicating artery with diminutive anterior cerebral artery, LEFT A1 segment is patent. Occluded LEFT M1 origin with mid to distal LEFT M1 thready collateralization and intermittent occlusion. No contrast extravasation or aneurysm. POSTERIOR CIRCULATION: Patent vertebral arteries, vertebrobasilar junction and basilar artery, as well as main branch vessels. Patent posterior cerebral arteries. Robust RIGHT posterior communicating artery present. No large vessel occlusion, hemodynamically significant stenosis, dissection, luminal irregularity, contrast extravasation or aneurysm. VENOUS SINUSES: Major dural venous sinuses are patent though not tailored for evaluation on this angiographic examination. ANATOMIC VARIANTS: Fetal origin RIGHT posterior cerebral artery. DELAYED PHASE: Faint posterior insular enhancement. MIP images reviewed. IMPRESSION: CTA NECK:  Acute LEFT internal carotid artery occlusion. Atherosclerosis without hemodynamically significant stenosis. Multilevel severe neural foraminal narrowing. CTA HEAD: Occluded LEFT ICA with reconstitution LEFT carotid terminus. Occluded proximal LEFT M1 segment with thready collateralization. Diminutive RIGHT anterior cerebral artery favoring normal variant. Critical Value/emergent results were called by telephone at the time of interpretation on 09/26/2016 at 8:42 pm to Dr. Marily Memos , who verbally acknowledged these results. Dr. Otelia Limes,  Neurology paged via [facility] secure hospital paging system on 09/26/2016 at 8:45 pm, awaiting return call. Electronically Signed: By: Awilda Metro M.D. On: 09/26/2016 20:52     STUDIES:  CT Head 5/21 > Emergent large vessel occlusion affecting the LEFT ICA terminus and proximal LEFT MCA. ASPECTS is 8, early hypoattenuation of the LEFT insula and LEFT anterolateral temporal cortex CTA Head/Neck 5/21 > Acute LEFT internal carotid artery occlusion,  Atherosclerosis without hemodynamically significant stenosis. Multilevel severe neural foraminal narrowing. Occluded LEFT ICA with reconstitution LEFT carotid terminus. Occluded proximal LEFT M1 segment with thready collateralization. Diminutive RIGHT anterior cerebral artery favoring normal variant.  CULTURES: None.  ANTIBIOTICS: None.  SIGNIFICANT EVENTS: 5/21 > Presents as Code Stroke > Taken to IR for revascularization   LINES/TUBES: ETT 5/21 >>  DISCUSSION: 61 year old male with occluded left ICA and left M1 segment taken to IR for revascularization.   ASSESSMENT / PLAN:  PULMONARY A: Respiratory Insufficiency in setting of CVA Angioedema > improving s/p benadryl/solu-medrol/pepcid in ED  P:   Monitor  Vent Management  Wean in AM  Trend ABG and CXR  Continue Solu-medrol, Benadryl, and Pepcid    CARDIOVASCULAR A:  H/O HTN, CAD s/p CABG P:  Cardiac Monitoring  Wean Cleviprex to Maintain Systolic 120-140 per Neurology  Continue home Lipitor, hold home metoprolol  Echo pending   RENAL A:   H/O Urethral Stricture  P:   Trend BMP Replace electrolytes as needed   GASTROINTESTINAL A:   Dysphagia  P:   NPO PPI Will start TF in AM if unable to extubate  Will need speech therapy evaluation  once extubated   HEMATOLOGIC A:   S/P TPA P:  Trend CBC Monitor for S&S bleeding   INFECTIOUS A:   Leukocytosis  P:   Trend WBC and Fever Curve   ENDOCRINE A:   Hyperglycemia    P:   Trend Glucose   NEUROLOGIC A:    Left ICA and Left MCA occlusion s/p revascularization  P:   RASS goal: -1/-2 Per Neurology/Neurosurgery  Repeat CT Head pending  MRI Brain  PT/OT when able  Wean propofol to achieve RASS PRN fentanyl   FAMILY  - Updates: No family at bedside   - Inter-disciplinary family meet or Palliative Care meeting due by:  5/28   CC Time: 42 minutes   Jovita KussmaulKatalina Kiano Terrien, AGACNP-BC [facility]  Pgr: [phone]  PCCM Pgr: [phone]

## 2016-09-26 NOTE — ED Provider Notes (Signed)
[facility] DEPT Provider Note   CSN: [id number] Arrival date & time: 09/26/16  1851     History   Chief Complaint Chief Complaint  Patient presents with  . Cerebrovascular Accident    HPI Johnny Sullivan is a 61 y.o. male.  1815 was found down at 1830 with flaccid right-side along with aphasia and brought immediately here. No surgeries or GI bleeding in the last 3 months. No known intracranial neoplasm or known aneurysms.    Neurologic Problem  This is a new problem. The current episode started less than 1 hour ago. The problem occurs constantly. The problem has been gradually worsening. Nothing aggravates the symptoms. Nothing relieves the symptoms. He has tried nothing for the symptoms.    Past Medical History:  Diagnosis Date  . Arthritis    Osteoarthritis- right hip  . Coronary artery disease   . GERD (gastroesophageal reflux disease)   . Hypertension   . Myocardial infarction (HCC)    " mild"-Dr. Abbey Chatters follows -gives clearance.  Marland Kitchen Urethral stricture    'uses self [place] caths weekly" to correct.    Patient Active Problem List   Diagnosis Date Noted  . 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 BACK SURGERY     lumbar discectomy  . CARDIAC CATHETERIZATION    . CORONARY ARTERY BYPASS GRAFT     09-20-2012 x3 -[place]  . TOTAL HIP ARTHROPLASTY Right 04/26/2016   Procedure: RIGHT TOTAL HIP ARTHROPLASTY ANTERIOR APPROACH;  Surgeon: Durene Romans, MD;  Location: [facility];  Service: Orthopedics;  Laterality: Right;  Marland Kitchen VASECTOMY    . WRIST SURGERY     "implant of plastic bone"-mild ROM limits       Home Medications    Prior to Admission medications   Medication Sig Start Date End Date Taking? Authorizing Provider  atorvastatin (LIPITOR) 80 MG tablet Take 80 mg by mouth daily at 6 PM.  03/23/16  Yes [provider]  hydrochlorothiazide  (HYDRODIURIL) 25 MG tablet Take 25 mg by mouth daily as needed (fluid retention).  04/11/16  Yes [provider]  metoprolol tartrate (LOPRESSOR) 25 MG tablet Take 25 mg by mouth 2 (two) times daily.  03/23/16  Yes [provider]  ranitidine (ZANTAC) 150 MG capsule Take 150 mg by mouth every evening.   Yes [provider]  amoxicillin (AMOXIL) 500 MG capsule Take 2,000 mg by mouth See admin instructions. ONE HOUR PRIOR TO DENTAL APPOINTMENT 09/23/16   [provider]    Family History History reviewed. No pertinent family history.  Social History Social History  Substance Use Topics  . Smoking status: Former Smoker    Packs/day: 1.50    Years: 15.00    Types: Cigarettes    Quit date: 04/20/2012  . Smokeless tobacco: Former Neurosurgeon    Types: Chew    Quit date: 04/20/2012  . Alcohol use Yes     Comment: occ. use     Allergies   Vancomycin   Review of Systems Review of Systems  Unable to perform ROS: Patient nonverbal     Physical Exam Updated Vital Signs BP 132/71 (BP Location: Right Arm)   Pulse 71   Temp 97.8 F (36.6 C) (Axillary)   Resp 20   Ht 5\' 9"  (1.753 m)   Wt 104.3 kg (230 lb)   SpO2 100%   BMI 33.97 kg/m   Physical Exam  Constitutional:  He appears well-developed and well-nourished.  HENT:  Head: Normocephalic and atraumatic.  Eyes: Conjunctivae are normal. Pupils are equal, round, and reactive to light.  Neck: Normal range of motion.  Cardiovascular: Normal rate and regular rhythm.   Pulmonary/Chest: Effort normal and breath sounds normal.  Abdominal: Soft. He exhibits no distension.  Neurological: He is alert. A cranial nerve deficit (R facial droop and aphasia) is present. He exhibits abnormal muscle tone (flaccid right side). Coordination abnormal.  Skin: Skin is warm and dry.  Nursing note and vitals reviewed.    ED Treatments / Results  Labs (all labs ordered are listed, but only abnormal results are  displayed) Labs Reviewed  CBC - Abnormal; Notable for the following:       Result Value   WBC 11.4 (*)    All other components within normal limits  DIFFERENTIAL - Abnormal; Notable for the following:    Neutro Abs 7.8 (*)    All other components within normal limits  COMPREHENSIVE METABOLIC PANEL - Abnormal; Notable for the following:    Glucose, Bld 132 (*)    All other components within normal limits  I-STAT CHEM 8, ED - Abnormal; Notable for the following:    Glucose, Bld 131 (*)    Calcium, Ion 1.14 (*)    All other components within normal limits  ETHANOL  PROTIME-INR  APTT  RAPID URINE DRUG SCREEN, HOSP PERFORMED  HEMOGLOBIN A1C  URINALYSIS, COMPLETE (UACMP) WITH MICROSCOPIC  BASIC METABOLIC PANEL  CBC  MAGNESIUM  PHOSPHORUS  BLOOD GAS, ARTERIAL  CBC WITH DIFFERENTIAL/PLATELET  HIV ANTIBODY (ROUTINE TESTING)  LIPID PANEL  I-STAT TROPOININ, ED  CBG MONITORING, ED    EKG  EKG Interpretation None       Radiology Ct Angio Head W Or Wo Contrast  Addendum Date: 09/26/2016   ADDENDUM REPORT: 09/26/2016 21:24 ADDENDUM: Acute findings discussed with and reconfirmed by Dr.Lindzen, Neurology on 09/26/2016 at 9:20 pm. Electronically Signed   By: Awilda Metro M.D.   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 EXAM: CT ANGIOGRAPHY HEAD AND NECK TECHNIQUE: Multidetector CT imaging of the head and neck was performed using the standard protocol during bolus administration of intravenous contrast. Multiplanar CT image reconstructions and MIPs were obtained to evaluate the vascular anatomy. Carotid stenosis measurements (when applicable) are obtained utilizing NASCET criteria, using the distal internal carotid diameter as the denominator. CONTRAST:  100 cc Isovue 370 COMPARISON:  CT HEAD Sep 26, 2016 1853 hours FINDINGS: CTA NECK AORTIC ARCH: Normal appearance of the thoracic  arch, 2 vessel arch is a normal variant. Mild calcific atherosclerosis of the aortic arch. The origins of the innominate, left Common carotid artery and subclavian artery are widely patent. RIGHT CAROTID SYSTEM: Common carotid artery is widely patent. Normal appearance of the carotid bifurcation without hemodynamically significant stenosis by NASCET criteria, moderate calcific atherosclerosis. Normal appearance of the included internal carotid artery. LEFT CAROTID SYSTEM: Common carotid artery is widely patent. Normal appearance of the carotid bifurcation without hemodynamically significant stenosis by NASCET criteria, moderate calcific atherosclerosis. Central thrombus and occluded LEFT internal carotid artery within 17 mm of the origin. VERTEBRAL ARTERIES:Left vertebral artery is dominant. Patent vertebral artery's with extrinsic compression due to degenerative cervical spine. SKELETON: No acute osseous process though bone windows have not been submitted. Status post median sternotomy. OTHER NECK: Soft tissues of the neck are non-acute though,  not tailored for evaluation. Severe mid cervical degenerative change resulting and severe bilateral C3-4, LEFT C4-5, bilateral C5-6 and moderate to severe C6-7 neural foraminal narrowing. CTA HEAD ANTERIOR CIRCULATION: LEFT internal carotid artery is occluded to the level of the carotid terminus, with focal central thrombus. Patent anterior communicating artery with diminutive anterior cerebral artery, LEFT A1 segment is patent. Occluded LEFT M1 origin with mid to distal LEFT M1 thready collateralization and intermittent occlusion. No contrast extravasation or aneurysm. POSTERIOR CIRCULATION: Patent vertebral arteries, vertebrobasilar junction and basilar artery, as well as main branch vessels. Patent posterior cerebral arteries. Robust RIGHT posterior communicating artery present. No large vessel occlusion, hemodynamically significant stenosis, dissection, luminal  irregularity, contrast extravasation or aneurysm. VENOUS SINUSES: Major dural venous sinuses are patent though not tailored for evaluation on this angiographic examination. ANATOMIC VARIANTS: Fetal origin RIGHT posterior cerebral artery. DELAYED PHASE: Faint posterior insular enhancement. MIP images reviewed. IMPRESSION: CTA NECK:  Acute LEFT internal carotid artery occlusion. Atherosclerosis without hemodynamically significant stenosis. Multilevel severe neural foraminal narrowing. CTA HEAD: Occluded LEFT ICA with reconstitution LEFT carotid terminus. Occluded proximal LEFT M1 segment with thready collateralization. Diminutive RIGHT anterior cerebral artery favoring normal variant. Critical Value/emergent results were called by telephone at the time of interpretation on 09/26/2016 at 8:42 pm to Dr. Marily MemosJASON Samentha Perham , who verbally acknowledged these results. Dr. Otelia LimesLindzen, Neurology paged via [facility] secure hospital paging system on 09/26/2016 at 8:45 pm, awaiting return call. Electronically Signed: By: Awilda Metroourtnay  Bloomer M.D. On: 09/26/2016 20:52   Ct Head Wo Contrast  Result Date: 09/26/2016 CLINICAL DATA:  Follow-up exam status post endovascular revascularization. EXAM: CT HEAD WITHOUT CONTRAST TECHNIQUE: Contiguous axial images were obtained from the base of the skull through the vertex without intravenous contrast. COMPARISON:  Prior CTs from earlier the same day. FINDINGS: Brain: Study mildly degraded by motion artifact. Ill-defined hypodensity with loss of gray-white matter differentiation seen throughout portions of the left MCA territory, most evident at the left insular cortex, consistent with evolving acute left MCA territory infarct. Probable involvement of the left temporal lobe and posterior left frontoparietal region as well. Suspected early involvement of the left caudate and lentiform nuclei as well. Scattered areas of serpiginous hyperdensity at the posterior left sylvian fissure most likely due to  contrast staining from recent endovascular procedure. Superimposed small focus of gas present within this region (series 3, image 22). Adjacent hyperdensity may reflect calcification and/or postsurgical changes. Superimposed hemorrhage not entirely excluded. No significant mass effect at this time. No other acute large vessel territory infarct. No other evidence for acute intracranial hemorrhage. No mass lesion, midline shift or mass effect. No hydrocephalus. No extra-axial fluid collection. Vascular: Diffuse hyperdensity throughout the intracranial vascular sure, likely related two recent IV contrast. Skull: Scalp soft tissues and calvarium are unchanged, and within normal limits. Sinuses/Orbits: Globes and oval soft tissues within normal limits. Paranasal sinuses and mastoids are clear. Endotracheal tube partially visualized. Other: None. IMPRESSION: 1. Evolving acute left MCA territory infarct as above. Superimposed scattered serpiginous hyperdensity most likely reflects contrast staining from recent endovascular revascularization procedure. Superimposed hemorrhage not entirely excluded. Attention at follow-up recommended. 2. Otherwise stable appearance of the brain. Electronically Signed   By: Rise MuBenjamin  McClintock M.D.   On: 09/26/2016 23:58   Ct Head Wo Contrast  Result Date: 09/26/2016 CLINICAL DATA:  Code stroke. Passed out earlier today. Unable to talk and move RIGHT side. EXAM: CT HEAD WITHOUT CONTRAST TECHNIQUE: Contiguous axial images were obtained from the base of the skull  through the vertex without intravenous contrast. COMPARISON:  None. FINDINGS: The patient was unable to remain motionless for the exam. Small or subtle lesions could be overlooked. Brain: There is asymmetric hypoattenuation of the LEFT anterolateral temporal lobe and insula consistent with early developing infarction. No other areas of concern for stroke are seen. There is no hemorrhage, mass lesion, hydrocephalus, or extra-axial  fluid. Normal for age cerebral volume. Mild hypoattenuation of white matter could represent small vessel disease. Vascular: Abnormal hyperattenuation of the LEFT ICA terminus and proximal LEFT M1 MCA representing emergent large vessel occlusion. Skull: Normal. Negative for fracture or focal lesion. Sinuses/Orbits: No acute finding. Other: None. ASPECTS [facility] Stroke Program Early CT Score) - Ganglionic level infarction (caudate, lentiform nuclei, internal capsule, insula, M1-M3 cortex): 5, loss of insula and M2 cortex. - Supraganglionic infarction (M4-M6 cortex): 3 Total score (0-10 with 10 being normal): 8 IMPRESSION: 1. Emergent large vessel occlusion affecting the LEFT ICA terminus and proximal LEFT MCA. 2. ASPECTS is 8, early hypoattenuation of the LEFT insula and LEFT anterolateral temporal cortex. These results were called by telephone at the time of interpretation on 09/26/2016 at 7:12 pm to Dr. Marily Memos , who verbally acknowledged these results. Electronically Signed   By: Elsie Stain M.D.   On: 09/26/2016 19:19   Ct Angio Neck W And/or Wo Contrast  Addendum Date: 09/26/2016   ADDENDUM REPORT: 09/26/2016 21:24 ADDENDUM: Acute findings discussed with and reconfirmed by Dr.Lindzen, Neurology on 09/26/2016 at 9:20 pm. Electronically Signed   By: Awilda Metro M.D.   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 EXAM: CT ANGIOGRAPHY HEAD AND NECK TECHNIQUE: Multidetector CT imaging of the head and neck was performed using the standard protocol during bolus administration of intravenous contrast. Multiplanar CT image reconstructions and MIPs were obtained to evaluate the vascular anatomy. Carotid stenosis measurements (when applicable) are obtained utilizing NASCET criteria, using the distal internal carotid diameter as the denominator. CONTRAST:  100 cc Isovue 370 COMPARISON:  CT HEAD Sep 26, 2016 1853 hours FINDINGS: CTA NECK AORTIC ARCH: Normal appearance of the thoracic arch, 2 vessel arch is a normal variant. Mild calcific atherosclerosis of the aortic arch. The origins of the innominate, left Common carotid artery and subclavian artery are widely patent. RIGHT CAROTID SYSTEM: Common carotid artery is widely patent. Normal appearance of the carotid bifurcation without hemodynamically significant stenosis by NASCET criteria, moderate calcific atherosclerosis. Normal appearance of the included internal carotid artery. LEFT CAROTID SYSTEM: Common carotid artery is widely patent. Normal appearance of the carotid bifurcation without hemodynamically significant stenosis by NASCET criteria, moderate calcific atherosclerosis. Central thrombus and occluded LEFT internal carotid artery within 17 mm of the origin. VERTEBRAL ARTERIES:Left vertebral artery is dominant. Patent vertebral artery's with extrinsic compression due to degenerative cervical spine. SKELETON: No acute osseous process though bone windows have not been submitted. Status post median sternotomy. OTHER NECK: Soft tissues of the neck are non-acute though, not tailored for evaluation. Severe mid cervical degenerative change resulting and severe bilateral C3-4, LEFT C4-5, bilateral C5-6 and moderate to severe C6-7 neural foraminal narrowing. CTA HEAD ANTERIOR CIRCULATION: LEFT internal carotid artery is occluded to the level of the carotid terminus, with focal central thrombus. Patent anterior communicating artery with diminutive anterior cerebral artery, LEFT A1 segment is patent. Occluded LEFT M1 origin with mid to distal LEFT M1 thready collateralization and intermittent occlusion. No  contrast extravasation or aneurysm. POSTERIOR CIRCULATION: Patent vertebral arteries, vertebrobasilar junction and basilar artery, as well as main branch vessels. Patent posterior cerebral arteries. Robust RIGHT posterior communicating artery present. No large  vessel occlusion, hemodynamically significant stenosis, dissection, luminal irregularity, contrast extravasation or aneurysm. VENOUS SINUSES: Major dural venous sinuses are patent though not tailored for evaluation on this angiographic examination. ANATOMIC VARIANTS: Fetal origin RIGHT posterior cerebral artery. DELAYED PHASE: Faint posterior insular enhancement. MIP images reviewed. IMPRESSION: CTA NECK:  Acute LEFT internal carotid artery occlusion. Atherosclerosis without hemodynamically significant stenosis. Multilevel severe neural foraminal narrowing. CTA HEAD: Occluded LEFT ICA with reconstitution LEFT carotid terminus. Occluded proximal LEFT M1 segment with thready collateralization. Diminutive RIGHT anterior cerebral artery favoring normal variant. Critical Value/emergent results were called by telephone at the time of interpretation on 09/26/2016 at 8:42 pm to Dr. Marily Memos , who verbally acknowledged these results. Dr. Otelia Limes, Neurology paged via [facility] secure hospital paging system on 09/26/2016 at 8:45 pm, awaiting return call. Electronically Signed: By: Awilda Metro M.D. On: 09/26/2016 20:52    Procedures Procedures (including critical care time)  CRITICAL CARE Performed by: Marily Memos Total critical care time: 45 minutes Critical care time was exclusive of separately billable procedures and treating other patients. Critical care was necessary to treat or prevent imminent or life-threatening deterioration. Critical care was time spent personally by me on the following activities: development of treatment plan with patient and/or surrogate as well as nursing, discussions with consultants, evaluation of patient's response to treatment, examination of patient, obtaining history from patient or surrogate, ordering and performing treatments and interventions, ordering and review of laboratory studies, ordering and review of radiographic studies, pulse oximetry and re-evaluation of  patient's condition.   Medications Ordered in ED Medications  alteplase (ACTIVASE) 1 mg/mL injection (not administered)  nitroGLYCERIN 100 MCG/ML intra-arterial injection (not administered)   stroke: mapping our early stages of recovery book (not administered)  0.9 %  sodium chloride infusion ( Intravenous Not Given 09/27/16 0012)  senna-docusate (Senokot-S) tablet 1 tablet (not administered)  pantoprazole (PROTONIX) injection 40 mg (not administered)  atorvastatin (LIPITOR) tablet 80 mg (not administered)  hydrochlorothiazide (HYDRODIURIL) tablet 25 mg (not administered)  metoprolol tartrate (LOPRESSOR) tablet 25 mg (not administered)  acetaminophen (TYLENOL) tablet 650 mg (not administered)    Or  acetaminophen (TYLENOL) solution 650 mg (not administered)    Or  acetaminophen (TYLENOL) suppository 650 mg (not administered)  ondansetron (ZOFRAN) injection 4 mg (not administered)  0.9 %  sodium chloride infusion (not administered)  clevidipine (CLEVIPREX) infusion 0.5 mg/mL (0 mg/hr Intravenous Not Given 09/27/16 0015)  0.9 %  sodium chloride infusion (not administered)  alteplase (ACTIVASE) 1 mg/mL infusion 90 mg (0 mg Intravenous Stopped 09/26/16 2025)    Followed by  0.9 %  sodium chloride infusion (50 mLs Intravenous New Bag/Given 09/26/16 2109)  iopamidol (ISOVUE-370) 76 % injection 100 mL (100 mLs Intravenous Contrast Given 09/26/16 1950)  famotidine (PEPCID) IVPB 20 mg premix (20 mg Intravenous New Bag/Given 09/26/16 2107)  methylPREDNISolone sodium succinate (SOLU-MEDROL) 125 mg/2 mL injection 125 mg (125 mg Intravenous Given by Other 09/26/16 2107)  diphenhydrAMINE (BENADRYL) injection 50 mg (25 mg Intravenous Given 09/26/16 2109)  iopamidol (ISOVUE-300) 61 % injection (100 mLs  Contrast Given 09/26/16 2254)  eptifibatide (INTEGRILIN) 20 MG/10ML injection (25 mcg  Given 09/26/16 2252)  ceFAZolin (ANCEF) 2-4 GM/100ML-% IVPB (  Duplicate 09/27/16 0013)     Initial Impression /  Assessment and Plan / ED Course  I  have reviewed the triage vital signs and the nursing notes.  Pertinent labs & imaging results that were available during my care of the patient were reviewed by me and considered in my medical decision making (see chart for details).     Code stroke immediately on arrival secondary to significant right-sided deficits approximately 45 minutes prior to my evaluation making him a candidate for femoral lysis. CT scan done and read by radiologist as having a left MCA and likely ICA occlusion. I consented the patient's sister-in-law who is the only family member and the emergency room at that time and after the risks and benefits were explained she agreed to go forth with femoral lysis. At this time the neuro examination via telemedicine was taking place and afterwards he also recommended TPA and also follow-up CTA along with transferred to [facility] for any neuro interventional procedures. By this time the patient's family had arrived and the wife agreed with all of this. [facility] to be transferred to [facility], Dr. Otelia Limes made aware, charge nurse of the emergency room made aware, emergency room physician of [facility] made aware. Patient airway intact at time of transfer   Final Clinical Impressions(s) / ED Diagnoses   Final diagnoses:  Cerebrovascular accident (CVA), unspecified mechanism (HCC)  Coarctation of aorta, recurrent, post-intervention      Vince Ainsley, Barbara Cower, MD 09/27/16 [phone]

## 2016-09-26 NOTE — Code Documentation (Signed)
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  He was found at 1830 with an apparent collapse in the field unable to speak and with flaccid right side weakness. [facility].  [facility] revealed an occlusion affecting Left ICA and prox Left MCA.  CTA at 2009 revealed a left ICA occlusion and a short occluded segment of prox MCA.   [facility] at 1929 [facility] with [facility] 20[facility].  Pt arrived [facility] at 2035 hrs. He was awake, aphasic,had a right facial droop,  following most commands, resolved Right side weakness. I was called at 2048 hrs   by Leavy CellaJasmine, RN ED to assist with preparation of pt for IR.  Dr Otelia LimesLindzen was at bedside at 2043.   NIHSS at 2050 hrs was 11.  At that time pt was noted to have swelling to the right side of his tongue and was given Solumedrol 125 mg IV, Benadryl 25 mg IV, and Pepcid 20 mg IV per MD order.ICU bed was requested at 2119, IR was paged notification at 2055.    Plan of care was discussed with family members and pt was taken to IR arriving at 2132.

## 2016-09-26 NOTE — Transfer of Care (Signed)
Immediate Anesthesia Transfer of Care Note  Patient: Johnny Sullivan  Procedure(s) Performed: Procedure(s): RADIOLOGY WITH ANESTHESIA (N/A)  Patient Location: NICU  Anesthesia Type:General  Level of Consciousness: Patient remains intubated per anesthesia plan  Airway & Oxygen Therapy: Patient placed on Ventilator (see vital sign flow sheet for setting)  Post-op Assessment: Report given to RN and Post -op Vital signs reviewed and stable  Post vital signs: Reviewed and stable  Last Vitals:  Vitals:   09/26/16 2130 09/26/16 2323  BP: (!) 166/82 (!) 145/80  Pulse:  79  Resp:  20  Temp:      Last Pain:  Vitals:   09/26/16 2118  TempSrc: Oral         Complications: No apparent anesthesia complications

## 2016-09-26 NOTE — Progress Notes (Signed)
Patient ID: Johnny Sullivan, male   DOB: 03/13/1956, 61 y.o.   MRN: [id number] Patient reportedly had acute neurological change at 630 pm according to wife.Aphasia with RT sided weaknes which worsened in the ER IVTPA given with no resolution . CT brain No ICH.Aspects?8 with hyperdense Lt ICA and Lt MCA sign. CTA occluded lT MCA prox ,occluded  Occluded LT ACA ,occluded LT ICA terminus with clot in the Lt ICA proximally. Pre ictus mRSscore of 0. Findings reviewed with spouse.Option of endovascular intervention to improve perfusion and potentially restrict neurological damage discussed.Risks of ICH 10 to 12 %,worsening neurological condition,vent dependency,death and inability to revascularize were all discussed with the spouse. .Witnessed informed consent obtained for endovascular revascularization under [place]. S.Rastus Borton.MD

## 2016-09-26 NOTE — ED Provider Notes (Signed)
Johnny Sullivan is a 61 y.o. male who is a transfer from [facility] for acute stroke status post TPA administration. Report was received from Dr. Clayborne DanaMesner prior to transfer.  Upon arrival, nursing that accompany patient reports that his symptoms are improving. Patient is having improvement in his right arm and right leg weakness. Patient is continuing to have expressive aphasia and cannot describe symptoms. They do report that patient has had some swelling of his right tongue. Patient is maintaining his airway with normal oxygen saturation, no tachypnea, and no stridor. No wheezing or rashes seen. No lip swelling appreciated. Full range of motion of neck.  Patient appears to be tolerating his secretions and is not choking or coughing.  Neurology arrived at the bedside shortly after arrival and recommended involvement of interventional radiology and admission to the neuro ICU.  Patient received Solu-Medrol, Pepcid, and Benadryl for the likely angioedema versus allergic reaction causing the asymmetric tongue swelling. Also considered is decrease in right-sided tongue muscle tone causing the appearance of swelling.  Joint conversation held with neurology, patient's wife, and patient's son about intubation. Decision made given the evidence of airway protection being maintained at this time to hold on intubation until clinical worsening or necessity for procedure. Family all agreed with plan given patient's non-worsening appearance.  Patient was taken to interventional radiology for further management. Patient had no other symptoms in the emergency department. Patient taken to IR for subsequent neurologic admission.     Clinical Impression: 1. Cerebrovascular accident (CVA), unspecified mechanism (HCC)   2. Acute embolic stroke (HCC)   3. Stroke (HCC)   4. Stroke (cerebrum) (HCC)   5. Encounter for intubation   6. Coarctation of aorta, recurrent, post-intervention   7. Encounter for  orogastric (OG) tube placement     Disposition: Admit to Neuro     Tegeler, Canary Brimhristopher J, MD 09/27/16 0157

## 2016-09-26 NOTE — ED Notes (Signed)
Pt's wallet given to sister in law, Scheryl MartenKim Moore

## 2016-09-26 NOTE — Procedures (Signed)
S/P Lt common carotid arteriogram followed by endovascular revascularization of occluded LT ICA terminus,Lt MCA and Lt ACA with x 2 passes with Solitaire  FR 40 MM retrieval device achieving a TICI 3 reperfusion.. Groin puncture to TICI 2b  26 mins, and to TICI 3 .

## 2016-09-26 NOTE — Anesthesia Preprocedure Evaluation (Addendum)
Anesthesia Evaluation  Patient identified by MRN, date of birth, ID band Patient confused    Reviewed: Allergy & Precautions, H&P , NPO status , Patient's Chart, lab work & pertinent test results, reviewed documented beta blocker date and time   Airway Mallampati: IV  TM Distance: >3 FB Neck ROM: Full    Dental no notable dental hx. (+) Teeth Intact, Dental Advisory Given   Pulmonary neg pulmonary ROS, former smoker,    Pulmonary exam normal breath sounds clear to auscultation       Cardiovascular hypertension, Pt. on medications and Pt. on home beta blockers + CAD and + CABG   Rhythm:Regular Rate:Normal     Neuro/Psych CVA, Residual Symptoms negative psych ROS   GI/Hepatic Neg liver ROS, GERD  Medicated and Controlled,  Endo/Other  negative endocrine ROS  Renal/GU negative Renal ROS  negative genitourinary   Musculoskeletal  (+) Arthritis , Osteoarthritis,    Abdominal   Peds  Hematology negative hematology ROS (+)   Anesthesia Other Findings   Reproductive/Obstetrics negative OB ROS                            Anesthesia Physical Anesthesia Plan  ASA: III and emergent  Anesthesia Plan: General   Post-op Pain Management:    Induction: Intravenous, Rapid sequence and Cricoid pressure planned  Airway Management Planned: Oral ETT and Video Laryngoscope Planned  Additional Equipment: Arterial line  Intra-op Plan:   Post-operative Plan: Post-operative intubation/ventilation  Informed Consent: I have reviewed the patients History and Physical, chart, labs and discussed the procedure including the risks, benefits and alternatives for the proposed anesthesia with the patient or authorized representative who has indicated his/her understanding and acceptance.   Dental advisory given  Plan Discussed with: CRNA  Anesthesia Plan Comments:        Anesthesia Quick Evaluation

## 2016-09-26 NOTE — Anesthesia Procedure Notes (Signed)
Procedure Name: Intubation Date/Time: 09/26/2016 9:41 PM Performed by: Molli HazardGORDON, Louvinia Cumbo M Pre-anesthesia Checklist: Patient identified, Emergency Drugs available, Suction available and Patient being monitored Patient Re-evaluated:Patient Re-evaluated prior to inductionOxygen Delivery Method: Circle system utilized Preoxygenation: Pre-oxygenation with 100% oxygen Intubation Type: IV induction, Rapid sequence and Cricoid Pressure applied Laryngoscope Size: Glidescope Grade View: Grade I Tube type: Subglottic suction tube Tube size: 7.5 mm Number of attempts: 2 Airway Equipment and Method: Stylet Placement Confirmation: ETT inserted through vocal cords under direct vision,  positive ETCO2 and breath sounds checked- equal and bilateral Secured at: 24 cm Tube secured with: Tape Dental Injury: Teeth and Oropharynx as per pre-operative assessment  Comments: DLx1 with Miller 2; unable to visualize cords; Glidescope used, easy visualization and intubation with Glidescope.

## 2016-09-26 NOTE — ED Triage Notes (Signed)
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 Last know well was at 1830 this evening.

## 2016-09-26 NOTE — ED Notes (Signed)
Paged code stoke @1854  Called SOC @1900 

## 2016-09-26 NOTE — ED Notes (Signed)
Family at bedside. 

## 2016-09-26 NOTE — Anesthesia Postprocedure Evaluation (Signed)
Anesthesia Post Note  Patient: Johnny Sullivan  Procedure(s) Performed: Procedure(s) (LRB): RADIOLOGY WITH ANESTHESIA (N/A)  Patient location during evaluation: SICU Anesthesia Type: General Level of consciousness: sedated Pain management: pain level controlled Vital Signs Assessment: post-procedure vital signs reviewed and stable Respiratory status: patient remains intubated per anesthesia plan Cardiovascular status: stable Anesthetic complications: no       Last Vitals:  Vitals:   09/26/16 2130 09/26/16 2323  BP: (!) 166/82 (!) 145/80  Pulse:  79  Resp:  20  Temp:      Last Pain:  Vitals:   09/26/16 2118  TempSrc: Oral                 Randall Rampersad,W. EDMOND

## 2016-09-26 NOTE — ED Notes (Signed)
TPA ended in route at 2025 [facility]

## 2016-09-26 NOTE — Progress Notes (Signed)
Patient ID: Johnny Sullivan, male   DOB: 12/12/1955, 61 y.o.   MRN: [id number] Immediate post procedure CT reveals early ischemic changes in the Lt anterior temporal lobe, the left external capsule and ant frontal centrum semi ovale..Focal area of small hemorrhage in the left post lentiform nucleus and post sylvian SAH  D/W patients spouse.Marland Kitchen. S.Jahari Billy MD

## 2016-09-27 ENCOUNTER — Inpatient Hospital Stay (HOSPITAL_COMMUNITY): Payer: BLUE CROSS/BLUE SHIELD

## 2016-09-27 ENCOUNTER — Encounter (HOSPITAL_COMMUNITY): Payer: Self-pay | Admitting: Interventional Radiology

## 2016-09-27 DIAGNOSIS — I351 Nonrheumatic aortic (valve) insufficiency: Secondary | ICD-10-CM

## 2016-09-27 DIAGNOSIS — E785 Hyperlipidemia, unspecified: Secondary | ICD-10-CM

## 2016-09-27 DIAGNOSIS — I63132 Cerebral infarction due to embolism of left carotid artery: Secondary | ICD-10-CM

## 2016-09-27 DIAGNOSIS — I638 Other cerebral infarction: Secondary | ICD-10-CM

## 2016-09-27 DIAGNOSIS — Z9282 Status post administration of tPA (rtPA) in a different facility within the last 24 hours prior to admission to current facility: Secondary | ICD-10-CM

## 2016-09-27 LAB — CBC WITH DIFFERENTIAL/PLATELET
Basophils Absolute: 0 10*3/uL (ref 0.0–0.1)
Basophils Relative: 0 %
EOS ABS: 0 10*3/uL (ref 0.0–0.7)
Eosinophils Relative: 0 %
HEMATOCRIT: 34.2 % — AB (ref 39.0–52.0)
HEMOGLOBIN: 10.8 g/dL — AB (ref 13.0–17.0)
LYMPHS ABS: 0.5 10*3/uL — AB (ref 0.7–4.0)
Lymphocytes Relative: 4 %
MCH: 26.7 pg (ref 26.0–34.0)
MCHC: 31.6 g/dL (ref 30.0–36.0)
MCV: 84.7 fL (ref 78.0–100.0)
Monocytes Absolute: 0 10*3/uL — ABNORMAL LOW (ref 0.1–1.0)
Monocytes Relative: 0 %
NEUTROS ABS: 11.4 10*3/uL — AB (ref 1.7–7.7)
NEUTROS PCT: 96 %
Platelets: 310 10*3/uL (ref 150–400)
RBC: 4.04 MIL/uL — AB (ref 4.22–5.81)
RDW: 15 % (ref 11.5–15.5)
WBC: 12 10*3/uL — AB (ref 4.0–10.5)

## 2016-09-27 LAB — BASIC METABOLIC PANEL
ANION GAP: 10 (ref 5–15)
BUN: 15 mg/dL (ref 6–20)
CHLORIDE: 109 mmol/L (ref 101–111)
CO2: 22 mmol/L (ref 22–32)
Calcium: 8.3 mg/dL — ABNORMAL LOW (ref 8.9–10.3)
Creatinine, Ser: 0.87 mg/dL (ref 0.61–1.24)
GFR calc Af Amer: >60 mL/min (ref >60)
GFR calc non Af Amer: >60 mL/min (ref >60)
Glucose, Bld: 171 mg/dL — ABNORMAL HIGH (ref 65–99)
POTASSIUM: 4.1 mmol/L (ref 3.5–5.1)
Sodium: 141 mmol/L (ref 135–145)

## 2016-09-27 LAB — PHOSPHORUS: Phosphorus: 4.3 mg/dL (ref 2.5–4.6)

## 2016-09-27 LAB — POCT I-STAT 3, ART BLOOD GAS (G3+)
Acid-base deficit: 4 mmol/L — ABNORMAL HIGH (ref 0.0–2.0)
BICARBONATE: 22.6 mmol/L (ref 20.0–28.0)
O2 Saturation: 97 %
PCO2 ART: 46.7 mmHg (ref 32.0–48.0)
PO2 ART: 103 mmHg (ref 83.0–108.0)
Patient temperature: 97.8
TCO2: 24 mmol/L (ref 0–100)
pH, Arterial: 7.29 — ABNORMAL LOW (ref 7.350–7.450)

## 2016-09-27 LAB — ECHOCARDIOGRAM COMPLETE
HEIGHTINCHES: 69 in
WEIGHTICAEL: 3742.53 [oz_av]

## 2016-09-27 LAB — URINALYSIS, COMPLETE (UACMP) WITH MICROSCOPIC
Bilirubin Urine: NEGATIVE
Glucose, UA: NEGATIVE mg/dL
KETONES UR: 5 mg/dL — AB
LEUKOCYTES UA: NEGATIVE
Nitrite: NEGATIVE
PROTEIN: NEGATIVE mg/dL
SQUAMOUS EPITHELIAL / LPF: NONE SEEN
Specific Gravity, Urine: >1.046 — ABNORMAL HIGH (ref 1.005–1.030)
pH: 5 (ref 5.0–8.0)

## 2016-09-27 LAB — LIPID PANEL
CHOL/HDL RATIO: 3.3 ratio
CHOLESTEROL: 108 mg/dL (ref 0–200)
HDL: 33 mg/dL — ABNORMAL LOW (ref >40)
LDL CALC: 58 mg/dL (ref 0–99)
TRIGLYCERIDES: 83 mg/dL (ref <150)
VLDL: 17 mg/dL (ref 0–40)

## 2016-09-27 LAB — RAPID URINE DRUG SCREEN, HOSP PERFORMED
AMPHETAMINES: NOT DETECTED
BARBITURATES: NOT DETECTED
BENZODIAZEPINES: NOT DETECTED
Cocaine: NOT DETECTED
Opiates: NOT DETECTED
TETRAHYDROCANNABINOL: NOT DETECTED

## 2016-09-27 LAB — TRIGLYCERIDES: TRIGLYCERIDES: 82 mg/dL (ref <150)

## 2016-09-27 LAB — MAGNESIUM: Magnesium: 2.2 mg/dL (ref 1.7–2.4)

## 2016-09-27 MED ORDER — DIPHENHYDRAMINE HCL 50 MG/ML IJ SOLN
12.5000 mg | Freq: Four times a day (QID) | INTRAMUSCULAR | Status: DC
  Administered 2016-09-27 – 2016-09-28 (×5): 12.5 mg via INTRAVENOUS
  Filled 2016-09-27 (×6): qty 1

## 2016-09-27 MED ORDER — FENTANYL CITRATE (PF) 100 MCG/2ML IJ SOLN
100.0000 ug | INTRAMUSCULAR | Status: AC | PRN
  Administered 2016-09-27 (×3): 100 ug via INTRAVENOUS
  Filled 2016-09-27 (×3): qty 2

## 2016-09-27 MED ORDER — LIDOCAINE HCL (CARDIAC) 20 MG/ML IV SOLN
INTRAVENOUS | Status: AC
  Filled 2016-09-27: qty 5

## 2016-09-27 MED ORDER — FOLIC ACID 5 MG/ML IJ SOLN
1.0000 mg | Freq: Every day | INTRAMUSCULAR | Status: DC
  Administered 2016-09-27 – 2016-09-29 (×3): 1 mg via INTRAVENOUS
  Filled 2016-09-27 (×5): qty 0.2

## 2016-09-27 MED ORDER — FENTANYL CITRATE (PF) 100 MCG/2ML IJ SOLN
100.0000 ug | INTRAMUSCULAR | Status: DC | PRN
  Administered 2016-09-27 – 2016-09-28 (×8): 100 ug via INTRAVENOUS
  Filled 2016-09-27 (×9): qty 2

## 2016-09-27 MED ORDER — CLEVIDIPINE BUTYRATE 0.5 MG/ML IV EMUL
0.0000 mg/h | INTRAVENOUS | Status: DC
  Administered 2016-09-27: 2 mg/h via INTRAVENOUS
  Filled 2016-09-27 (×2): qty 50

## 2016-09-27 MED ORDER — THIAMINE HCL 100 MG/ML IJ SOLN
100.0000 mg | Freq: Every day | INTRAMUSCULAR | Status: DC
  Administered 2016-09-27 – 2016-09-30 (×4): 100 mg via INTRAVENOUS
  Filled 2016-09-27 (×6): qty 2

## 2016-09-27 MED ORDER — CHLORHEXIDINE GLUCONATE 0.12% ORAL RINSE (MEDLINE KIT)
15.0000 mL | Freq: Two times a day (BID) | OROMUCOSAL | Status: DC
  Administered 2016-09-27 – 2016-09-28 (×4): 15 mL via OROMUCOSAL

## 2016-09-27 MED ORDER — FAMOTIDINE IN NACL 20-0.9 MG/50ML-% IV SOLN
20.0000 mg | Freq: Two times a day (BID) | INTRAVENOUS | Status: DC
  Administered 2016-09-27 – 2016-09-28 (×3): 20 mg via INTRAVENOUS
  Filled 2016-09-27 (×3): qty 50

## 2016-09-27 MED ORDER — ACETAMINOPHEN 650 MG RE SUPP: 650.0000 mg | RECTAL | Status: DC | PRN

## 2016-09-27 MED ORDER — ACETAMINOPHEN 160 MG/5ML PO SOLN: 650.0000 mg | ORAL | Status: DC | PRN

## 2016-09-27 MED ORDER — ONDANSETRON HCL 4 MG/2ML IJ SOLN: 4.0000 mg | Freq: Four times a day (QID) | INTRAMUSCULAR | Status: DC | PRN

## 2016-09-27 MED ORDER — LORAZEPAM 2 MG/ML IJ SOLN
1.0000 mg | Freq: Once | INTRAMUSCULAR | Status: AC
  Administered 2016-09-27: 2 mg via INTRAVENOUS
  Filled 2016-09-27: qty 1

## 2016-09-27 MED ORDER — ACETAMINOPHEN 325 MG PO TABS: 650.0000 mg | ORAL_TABLET | ORAL | Status: DC | PRN

## 2016-09-27 MED ORDER — ORAL CARE MOUTH RINSE
15.0000 mL | OROMUCOSAL | Status: DC
  Administered 2016-09-27 – 2016-09-28 (×19): 15 mL via OROMUCOSAL

## 2016-09-27 MED ORDER — METHYLPREDNISOLONE SODIUM SUCC 40 MG IJ SOLR
40.0000 mg | Freq: Four times a day (QID) | INTRAMUSCULAR | Status: DC
  Administered 2016-09-27 – 2016-09-28 (×5): 40 mg via INTRAVENOUS
  Filled 2016-09-27 (×6): qty 1

## 2016-09-27 MED ORDER — SODIUM CHLORIDE 0.9 % IV SOLN
INTRAVENOUS | Status: DC
  Administered 2016-09-27 – 2016-09-28 (×2): via INTRAVENOUS

## 2016-09-27 MED ORDER — SODIUM CHLORIDE 0.9 % IV SOLN
0.0000 ug/min | INTRAVENOUS | Status: DC
  Filled 2016-09-27: qty 1

## 2016-09-27 MED ORDER — PERFLUTREN LIPID MICROSPHERE
INTRAVENOUS | Status: AC
  Administered 2016-09-27: 2 mL
  Filled 2016-09-27: qty 10

## 2016-09-27 MED ORDER — PROPOFOL 1000 MG/100ML IV EMUL
0.0000 ug/kg/min | INTRAVENOUS | Status: DC
  Administered 2016-09-27: 40 ug/kg/min via INTRAVENOUS
  Administered 2016-09-27: 35 ug/kg/min via INTRAVENOUS
  Administered 2016-09-27 (×3): 40 ug/kg/min via INTRAVENOUS
  Administered 2016-09-27: 35 ug/kg/min via INTRAVENOUS
  Administered 2016-09-28 (×2): 25 ug/kg/min via INTRAVENOUS
  Filled 2016-09-27 (×8): qty 100

## 2016-09-27 MED ORDER — ATORVASTATIN CALCIUM 80 MG PO TABS
80.0000 mg | ORAL_TABLET | Freq: Every day | ORAL | Status: DC
  Administered 2016-09-28 – 2016-09-30 (×3): 80 mg via ORAL
  Filled 2016-09-27 (×3): qty 1

## 2016-09-27 MED ORDER — ASPIRIN 325 MG PO TABS
ORAL_TABLET | ORAL | Status: AC
  Filled 2016-09-27: qty 1

## 2016-09-27 MED ORDER — LIDOCAINE HCL (PF) 1 % IJ SOLN
INTRAMUSCULAR | Status: AC
  Administered 2016-09-27: 5 mL
  Filled 2016-09-27: qty 5

## 2016-09-27 NOTE — Progress Notes (Signed)
RT assisted in pt transport on vent from 4N25 to MRI.  Vitals remained stable throughout.  Pt placed on MRI vent for the test.

## 2016-09-27 NOTE — Progress Notes (Signed)
SLP Cancellation Note  Patient Details Name: Johnny Sullivan MRN: [id number] DOB: 03/07/1956   Cancelled treatment:       Reason Eval/Treat Not Completed: Patient not medically ready. Pt is intubated/sedated. Will f/u as able.   Maxcine Hamaiewonsky, Brennen Gardiner 09/27/2016, 9:57 AM  Maxcine HamLaura Paiewonsky, M.A. CCC-SLP [phone]

## 2016-09-27 NOTE — Progress Notes (Signed)
  Echocardiogram 2D Echocardiogram has been performed.  Pieter PartridgeBrooke S Jaaron Oleson 09/27/2016, 12:23 PM

## 2016-09-27 NOTE — Progress Notes (Signed)
Referring Physician(s): Dr Jerel ShepherdJ Xu  Supervising Physician: Julieanne Cottoneveshwar, Sanjeev  [facility]  [facility]  Chief Complaint:  CVA L ICA; MCA; ACA revascularization 5/21  Subjective:  5/21: S/P Lt common carotid arteriogram followed by endovascular revascularization of occluded LT ICA terminus,Lt MCA and Lt ACA with x 2 passes with Solitaire  FR 4mmx 40 MM retrieval device achieving a TICI 3 reperfusion.. Groin puncture to TICI 2b  26 mins, and to TICI 3 44mns.  Intubated; vent Does move all 4s per RN Not to command Still noted Rt weakness per RN  Allergies: Alteplase and Vancomycin  Medications: Prior to Admission medications   Medication Sig Start Date End Date Taking? Authorizing Provider  aspirin EC 81 MG tablet Take 81 mg by mouth daily.   Yes [provider]  atorvastatin (LIPITOR) 80 MG tablet Take 80 mg by mouth daily at 6 PM.  03/23/16  Yes [provider]  hydrochlorothiazide (HYDRODIURIL) 25 MG tablet Take 25 mg by mouth daily as needed (fluid retention).  04/11/16  Yes [provider]  metoprolol tartrate (LOPRESSOR) 25 MG tablet Take 25 mg by mouth 2 (two) times daily.  03/23/16  Yes [provider]  ranitidine (ZANTAC) 150 MG capsule Take 150 mg by mouth daily as needed for heartburn.    Yes [provider]  amoxicillin (AMOXIL) 500 MG capsule Take 2,000 mg by mouth See admin instructions. ONE HOUR PRIOR TO DENTAL APPOINTMENT 09/23/16   [provider]     Vital Signs: BP 119/60   Pulse 63   Temp 97.9 F (36.6 C)   Resp 16   Ht 5\' 9"  (1.753 m)   Wt 233 lb 14.5 oz (106.1 kg)   SpO2 100%   BMI 34.54 kg/m   Physical Exam  Pulmonary/Chest:  vent  Abdominal: Soft.  Musculoskeletal:  Moves all 4s - not to command - prob pain stimuli Right is slower than left   Skin: Skin is warm and dry.  Right groin sheath intact (for removal today) No bleeding; no hematoma Rt foot 1+ pulses  Psychiatric:    Family at bedside  Nursing note and vitals reviewed.   Imaging: Ct Angio Head W Or Wo Contrast  Addendum Date: 09/26/2016   ADDENDUM REPORT: 09/26/2016 21:24 ADDENDUM: Acute findings discussed with and reconfirmed by Dr.Lindzen, Neurology on 09/26/2016 at 9:20 pm. Electronically Signed   By: Awilda Metroourtnay  Bloomer M.D.   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 EXAM: CT ANGIOGRAPHY HEAD AND NECK TECHNIQUE: Multidetector CT imaging of the head and neck was performed using the standard protocol during bolus administration of intravenous contrast. Multiplanar CT image reconstructions and MIPs were obtained to evaluate the vascular anatomy. Carotid stenosis measurements (when applicable) are obtained utilizing NASCET criteria, using the distal internal carotid diameter as the denominator. CONTRAST:  100 cc Isovue 370 COMPARISON:  CT HEAD Sep 26, 2016 1853 hours FINDINGS: CTA NECK AORTIC ARCH: Normal appearance of the thoracic arch, 2 vessel arch is a normal variant. Mild calcific atherosclerosis of the aortic arch. The origins of the innominate, left Common carotid artery and subclavian artery are widely patent. RIGHT CAROTID SYSTEM: Common carotid artery is widely patent. Normal appearance of the carotid bifurcation without hemodynamically significant stenosis by NASCET criteria, moderate calcific atherosclerosis. Normal appearance of the included internal carotid artery. LEFT CAROTID SYSTEM: Common carotid artery is  widely patent. Normal appearance of the carotid bifurcation without hemodynamically significant stenosis by NASCET criteria, moderate calcific atherosclerosis. Central thrombus and occluded LEFT internal carotid artery within 17 mm of the origin. VERTEBRAL ARTERIES:Left vertebral artery is dominant. Patent vertebral artery's with extrinsic compression due to degenerative cervical spine. SKELETON:  No acute osseous process though bone windows have not been submitted. Status post median sternotomy. OTHER NECK: Soft tissues of the neck are non-acute though, not tailored for evaluation. Severe mid cervical degenerative change resulting and severe bilateral C3-4, LEFT C4-5, bilateral C5-6 and moderate to severe C6-7 neural foraminal narrowing. CTA HEAD ANTERIOR CIRCULATION: LEFT internal carotid artery is occluded to the level of the carotid terminus, with focal central thrombus. Patent anterior communicating artery with diminutive anterior cerebral artery, LEFT A1 segment is patent. Occluded LEFT M1 origin with mid to distal LEFT M1 thready collateralization and intermittent occlusion. No contrast extravasation or aneurysm. POSTERIOR CIRCULATION: Patent vertebral arteries, vertebrobasilar junction and basilar artery, as well as main branch vessels. Patent posterior cerebral arteries. Robust RIGHT posterior communicating artery present. No large vessel occlusion, hemodynamically significant stenosis, dissection, luminal irregularity, contrast extravasation or aneurysm. VENOUS SINUSES: Major dural venous sinuses are patent though not tailored for evaluation on this angiographic examination. ANATOMIC VARIANTS: Fetal origin RIGHT posterior cerebral artery. DELAYED PHASE: Faint posterior insular enhancement. MIP images reviewed. IMPRESSION: CTA NECK:  Acute LEFT internal carotid artery occlusion. Atherosclerosis without hemodynamically significant stenosis. Multilevel severe neural foraminal narrowing. CTA HEAD: Occluded LEFT ICA with reconstitution LEFT carotid terminus. Occluded proximal LEFT M1 segment with thready collateralization. Diminutive RIGHT anterior cerebral artery favoring normal variant. Critical Value/emergent results were called by telephone at the time of interpretation on 09/26/2016 at 8:42 pm to Dr. Marily Memos , who verbally acknowledged these results. Dr. Otelia Limes, Neurology paged via [facility] secure  hospital paging system on 09/26/2016 at 8:45 pm, awaiting return call. Electronically Signed: By: Awilda Metro M.D. On: 09/26/2016 20:52   Ct Head Wo Contrast  Result Date: 09/26/2016 CLINICAL DATA:  Follow-up exam status post endovascular revascularization. EXAM: CT HEAD WITHOUT CONTRAST TECHNIQUE: Contiguous axial images were obtained from the base of the skull through the vertex without intravenous contrast. COMPARISON:  Prior CTs from earlier the same day. FINDINGS: Brain: Study mildly degraded by motion artifact. Ill-defined hypodensity with loss of gray-white matter differentiation seen throughout portions of the left MCA territory, most evident at the left insular cortex, consistent with evolving acute left MCA territory infarct. Probable involvement of the left temporal lobe and posterior left frontoparietal region as well. Suspected early involvement of the left caudate and lentiform nuclei as well. Scattered areas of serpiginous hyperdensity at the posterior left sylvian fissure most likely due to contrast staining from recent endovascular procedure. Superimposed small focus of gas present within this region (series 3, image 22). Adjacent hyperdensity may reflect calcification and/or postsurgical changes. Superimposed hemorrhage not entirely excluded. No significant mass effect at this time. No other acute large vessel territory infarct. No other evidence for acute intracranial hemorrhage. No mass lesion, midline shift or mass effect. No hydrocephalus. No extra-axial fluid collection. Vascular: Diffuse hyperdensity throughout the intracranial vascular sure, likely related two recent IV contrast. Skull: Scalp soft tissues and calvarium are unchanged, and within normal limits. Sinuses/Orbits: Globes and oval soft tissues within normal limits. Paranasal sinuses and mastoids are clear. Endotracheal tube partially visualized. Other: None. IMPRESSION: 1. Evolving acute left MCA territory infarct as  above. Superimposed scattered serpiginous hyperdensity most likely reflects contrast staining from recent  endovascular revascularization procedure. Superimposed hemorrhage not entirely excluded. Attention at follow-up recommended. 2. Otherwise stable appearance of the brain. Electronically Signed   By: Rise Mu M.D.   On: 09/26/2016 23:58   Ct Head Wo Contrast  Result Date: 09/26/2016 CLINICAL DATA:  Code stroke. Passed out earlier today. Unable to talk and move RIGHT side. EXAM: CT HEAD WITHOUT CONTRAST TECHNIQUE: Contiguous axial images were obtained from the base of the skull through the vertex without intravenous contrast. COMPARISON:  None. FINDINGS: The patient was unable to remain motionless for the exam. Small or subtle lesions could be overlooked. Brain: There is asymmetric hypoattenuation of the LEFT anterolateral temporal lobe and insula consistent with early developing infarction. No other areas of concern for stroke are seen. There is no hemorrhage, mass lesion, hydrocephalus, or extra-axial fluid. Normal for age cerebral volume. Mild hypoattenuation of white matter could represent small vessel disease. Vascular: Abnormal hyperattenuation of the LEFT ICA terminus and proximal LEFT M1 MCA representing emergent large vessel occlusion. Skull: Normal. Negative for fracture or focal lesion. Sinuses/Orbits: No acute finding. Other: None. ASPECTS [facility] Stroke Program Early CT Score) - Ganglionic level infarction (caudate, lentiform nuclei, internal capsule, insula, M1-M3 cortex): 5, loss of insula and M2 cortex. - Supraganglionic infarction (M4-M6 cortex): 3 Total score (0-10 with 10 being normal): 8 IMPRESSION: 1. Emergent large vessel occlusion affecting the LEFT ICA terminus and proximal LEFT MCA. 2. ASPECTS is 8, early hypoattenuation of the LEFT insula and LEFT anterolateral temporal cortex. These results were called by telephone at the time of interpretation on 09/26/2016 at 7:12 pm to  Dr. Marily Memos , who verbally acknowledged these results. Electronically Signed   By: Elsie Stain M.D.   On: 09/26/2016 19:19   Ct Angio Neck W And/or Wo Contrast  Addendum Date: 09/26/2016   ADDENDUM REPORT: 09/26/2016 21:24 ADDENDUM: Acute findings discussed with and reconfirmed by Dr.Lindzen, Neurology on 09/26/2016 at 9:20 pm. Electronically Signed   By: Awilda Metro M.D.   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 EXAM: CT ANGIOGRAPHY HEAD AND NECK TECHNIQUE: Multidetector CT imaging of the head and neck was performed using the standard protocol during bolus administration of intravenous contrast. Multiplanar CT image reconstructions and MIPs were obtained to evaluate the vascular anatomy. Carotid stenosis measurements (when applicable) are obtained utilizing NASCET criteria, using the distal internal carotid diameter as the denominator. CONTRAST:  100 cc Isovue 370 COMPARISON:  CT HEAD Sep 26, 2016 1853 hours FINDINGS: CTA NECK AORTIC ARCH: Normal appearance of the thoracic arch, 2 vessel arch is a normal variant. Mild calcific atherosclerosis of the aortic arch. The origins of the innominate, left Common carotid artery and subclavian artery are widely patent. RIGHT CAROTID SYSTEM: Common carotid artery is widely patent. Normal appearance of the carotid bifurcation without hemodynamically significant stenosis by NASCET criteria, moderate calcific atherosclerosis. Normal appearance of the included internal carotid artery. LEFT CAROTID SYSTEM: Common carotid artery is widely patent. Normal appearance of the carotid bifurcation without hemodynamically significant stenosis by NASCET criteria, moderate calcific atherosclerosis. Central thrombus and occluded LEFT internal carotid artery within 17 mm of the origin. VERTEBRAL ARTERIES:Left vertebral artery is dominant. Patent vertebral artery's with  extrinsic compression due to degenerative cervical spine. SKELETON: No acute osseous process though bone windows have not been submitted. Status post median sternotomy. OTHER NECK: Soft tissues of the neck are non-acute though, not  tailored for evaluation. Severe mid cervical degenerative change resulting and severe bilateral C3-4, LEFT C4-5, bilateral C5-6 and moderate to severe C6-7 neural foraminal narrowing. CTA HEAD ANTERIOR CIRCULATION: LEFT internal carotid artery is occluded to the level of the carotid terminus, with focal central thrombus. Patent anterior communicating artery with diminutive anterior cerebral artery, LEFT A1 segment is patent. Occluded LEFT M1 origin with mid to distal LEFT M1 thready collateralization and intermittent occlusion. No contrast extravasation or aneurysm. POSTERIOR CIRCULATION: Patent vertebral arteries, vertebrobasilar junction and basilar artery, as well as main branch vessels. Patent posterior cerebral arteries. Robust RIGHT posterior communicating artery present. No large vessel occlusion, hemodynamically significant stenosis, dissection, luminal irregularity, contrast extravasation or aneurysm. VENOUS SINUSES: Major dural venous sinuses are patent though not tailored for evaluation on this angiographic examination. ANATOMIC VARIANTS: Fetal origin RIGHT posterior cerebral artery. DELAYED PHASE: Faint posterior insular enhancement. MIP images reviewed. IMPRESSION: CTA NECK:  Acute LEFT internal carotid artery occlusion. Atherosclerosis without hemodynamically significant stenosis. Multilevel severe neural foraminal narrowing. CTA HEAD: Occluded LEFT ICA with reconstitution LEFT carotid terminus. Occluded proximal LEFT M1 segment with thready collateralization. Diminutive RIGHT anterior cerebral artery favoring normal variant. Critical Value/emergent results were called by telephone at the time of interpretation on 09/26/2016 at 8:42 pm to Dr. Marily Memos , who verbally  acknowledged these results. Dr. Otelia Limes, Neurology paged via [facility] secure hospital paging system on 09/26/2016 at 8:45 pm, awaiting return call. Electronically Signed: By: Awilda Metro M.D. On: 09/26/2016 20:52   Dg Chest Port 1 View  Result Date: 09/27/2016 CLINICAL DATA:  Endotracheal tube placement.  Initial encounter. EXAM: PORTABLE CHEST 1 VIEW COMPARISON:  None. FINDINGS: The patient's endotracheal tube is seen ending 3-4 cm above the carina. The lungs are hypoexpanded. Vascular congestion is noted. Left basilar airspace opacity may reflect pneumonia. No pleural effusion or pneumothorax is seen. The cardiomediastinal silhouette is mildly enlarged. The patient is status post median sternotomy. There is a chronic fracture of the superior most sternal wire. No acute osseous abnormalities are identified. IMPRESSION: 1. Endotracheal tube seen ending 3-4 cm above the carina. 2. Lungs hypoexpanded. Vascular congestion and mild cardiomegaly. Left basilar airspace opacity may reflect pneumonia. Electronically Signed   By: Roanna Raider M.D.   On: 09/27/2016 00:43   Dg Abd Portable 1v  Result Date: 09/27/2016 CLINICAL DATA:  Orogastric tube placement.  Initial encounter. EXAM: PORTABLE ABDOMEN - 1 VIEW COMPARISON:  None. FINDINGS: The patient's enteric tube is noted ending overlying the body of the stomach, with the side port about the fundus of the stomach. The visualized bowel gas pattern is grossly unremarkable. Contrast is seen within the renal calyces. No acute osseous abnormalities are identified. Left basilar airspace opacity may reflect pneumonia. IMPRESSION: 1. Enteric tube noted ending overlying the body of stomach, with the side port about the fundus of the stomach. 2. Left basilar airspace opacity may reflect pneumonia. Electronically Signed   By: Roanna Raider M.D.   On: 09/27/2016 00:44    Labs:  CBC:  Recent Labs  04/20/16 1104 09/26/16 1851 09/26/16 1910 09/27/16 0210  WBC 9.4  11.4*  --  12.0*  HGB 13.1 13.0 13.6 10.8*  HCT 39.8 39.3 40.0 34.2*  PLT 391 324  --  310    COAGS:  Recent Labs  09/26/16 1851  INR 0.98  APTT 28    BMP:  Recent Labs  04/20/16 1104 09/26/16 1851 09/26/16 1910 09/27/16 0210  NA 139 142 143 141  K 4.7  3.6 3.6 4.1  CL 106 107 107 109  CO2 26 23  --  22  GLUCOSE 100* 132* 131* 171*  BUN 18 18 18 15   CALCIUM 9.1 9.7  --  8.3*  CREATININE 0.75 1.15 1.20 0.87  GFRNONAA >60 >60  --  >60  GFRAA >60 >60  --  >60    LIVER FUNCTION TESTS:  Recent Labs  09/26/16 1851  BILITOT 0.5  AST 34  ALT 28  ALKPHOS 95  PROT 8.0  ALBUMIN 4.5    Assessment and Plan:  CVA Left ICA/MCA/ACA revascularization Will follow  Electronically Signed: Lakie Mclouth A, PA-C 09/27/2016, 12:38 PM   I spent a total of 15 Minutes at the the patient's bedside AND on the patient's hospital floor or unit, greater than 50% of which was counseling/coordinating care for CVA; revascularization

## 2016-09-27 NOTE — Consult Note (Signed)
Subjective: CC: Urinary retention with history of stricture.  Hx:  [name] is a 61 yo WM who I was asked to see in consultation by Dr. Kerney Elbe for urinary retention.   The patient is intubated in the ICU following TPA for a CVA.   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  He had used a 172fcatheter at home and [facility]     ROS:  Review of Systems  Unable to perform ROS: Intubated    Allergies  Allergen Reactions  . Vancomycin Other (See Comments)    ReHarle Battiestyndrome    Past Medical History:  Diagnosis Date  . Arthritis    Osteoarthritis- right hip  . Coronary artery disease   . GERD (gastroesophageal reflux disease)   . Hypertension   . Myocardial infarction (HCClyde   " mild"-Dr. ZaDebbora Lacrosseollows -gives clearance.  . Marland Kitchenrethral stricture    'uses self FrPakistanaths weekly" to correct.    Past Surgical History:  Procedure Laterality Date  . BACK SURGERY     lumbar discectomy  . CARDIAC CATHETERIZATION    . CORONARY ARTERY BYPASS GRAFT     09-20-2012 x3 -[place]  . TOTAL HIP ARTHROPLASTY Right 04/26/2016   Procedure: RIGHT TOTAL HIP ARTHROPLASTY ANTERIOR APPROACH;  Surgeon: MaParalee CancelMD;  Location: [facility];  Service: Orthopedics;  Laterality: Right;  . Marland KitchenASECTOMY    . WRIST SURGERY     "implant of plastic bone"-mild ROM limits    Social History   Social History  . Marital status: Married    Spouse name: N/A  . Number of children: N/A  . Years of education: N/A   Occupational History  . Not on file.   Social History Main Topics  . Smoking status: Former Smoker    Packs/day: 1.50    Years: 15.00    Types: Cigarettes    Quit date: 04/20/2012  . Smokeless tobacco: Former [employer] developer  Types: ChBaldwin Parkate: 04/20/2012  . Alcohol use  Yes     Comment: occ. use  . Drug use: No  . Sexual activity: Yes   Other Topics Concern  . Not on file   Social History Narrative  . No narrative on file    History reviewed. No pertinent family history.  Unable to obtain with patient on vent.   Anti-infectives: Anti-infectives    Start     Dose/Rate Route Frequency Ordered Stop   09/26/16 2203  ceFAZolin (ANCEF) 2-4 GM/100ML-% IVPB    Comments:  OfNovella Rob : cabinet override      09/26/16 2203 09/27/16 0013      Current Facility-Administered Medications  Medication Dose Route Frequency Provider Last Rate Last Dose  .  stroke: mapping our early stages of recovery book   Does not apply Once LiKerney ElbeMD      . 0.9 %  sodium chloride infusion   Intravenous Continuous LiKerney ElbeMD      . 0.9 %  sodium chloride infusion   Intravenous Continuous DeLuanne BrasMD 75 mL/hr at 09/27/16 0041    . 0.9 %  sodium chloride infusion  250 mL Intravenous PRN EuOmar PersonNP      . acetaminophen (TYLENOL) tablet 650 mg  650 mg Oral Q4H PRN Luanne Bras, MD       Or  . acetaminophen (TYLENOL) solution 650 mg  650 mg Per Tube Q4H PRN Deveshwar, Sanjeev, MD       Or  . acetaminophen (TYLENOL) suppository 650 mg  650 mg Rectal Q4H PRN Deveshwar, Sanjeev, MD      . alteplase (ACTIVASE) 1 mg/mL injection           . [START ON 09/28/2016] atorvastatin (LIPITOR) tablet 80 mg  80 mg Oral q1800 Omar Person, NP      . chlorhexidine gluconate (MEDLINE KIT) (PERIDEX) 0.12 % solution 15 mL  15 mL Mouth Rinse BID Kerney Elbe, MD   15 mL at 09/27/16 0100  . clevidipine (CLEVIPREX) infusion 0.5 mg/mL  0-21 mg/hr Intravenous Continuous Deveshwar, Sanjeev, MD      . diphenhydrAMINE (BENADRYL) injection 12.5 mg  12.5 mg Intravenous Q6H Omar Person, NP   12.5 mg at 09/27/16 0319  . famotidine (PEPCID) IVPB 20 mg premix  20 mg Intravenous Q12H Hayden Pedro M, NP      . fentaNYL (SUBLIMAZE) injection 100 mcg   100 mcg Intravenous Q2H PRN Omar Person, NP   100 mcg at 09/27/16 0454  . folic acid injection 1 mg  1 mg Intravenous Daily Hayden Pedro M, NP      . lidocaine (cardiac) 100 mg/73m (XYLOCAINE) 20 MG/ML injection 2%           . lidocaine (PF) (XYLOCAINE) 1 % injection           . MEDLINE mouth rinse  15 mL Mouth Rinse 10 times per day LKerney Elbe MD   15 mL at 09/27/16 0319  . methylPREDNISolone sodium succinate (SOLU-MEDROL) 40 mg/mL injection 40 mg  40 mg Intravenous Q6H EOmar Person NP   40 mg at 09/27/16 0320  . nitroGLYCERIN 100 MCG/ML intra-arterial injection           . ondansetron (ZOFRAN) injection 4 mg  4 mg Intravenous Q6H PRN Deveshwar, Sanjeev, MD      . phenylephrine (NEO-SYNEPHRINE) 10 mg in sodium chloride 0.9 % 250 mL (0.04 mg/mL) infusion  0-100 mcg/min Intravenous Titrated EOmar Person NP      . propofol (DIPRIVAN) 1000 MG/100ML infusion  0-50 mcg/kg/min Intravenous Continuous EOmar Person NP 21.9 mL/hr at 09/27/16 0105 35 mcg/kg/min at 09/27/16 0105  . senna-docusate (Senokot-S) tablet 1 tablet  1 tablet Oral QHS PRN LKerney Elbe MD      . thiamine (B-1) injection 100 mg  100 mg Intravenous Daily EOmar Person NP       Past medical, surgical, social and family history reviewed.  Objective: Vital signs in last 24 hours: Temp:  [97.8 F (36.6 C)-98.6 F (37 C)] 97.8 F (36.6 C) (05/21 2345) Pulse Rate:  [64-106] 65 (05/22 0530) Resp:  [7-26] 16 (05/22 0530) BP: (96-189)/(53-135) 111/60 (05/22 0530) SpO2:  [93 %-100 %] 100 % (05/22 0530) Arterial Line BP: (98-292)/(42-72) 110/46 (05/22 0530) FiO2 (%):  [40 %] 40 % (05/22 0313) Weight:  [104.3 kg (230 lb)-106.1 kg (233 lb 14.5 oz)] 106.1 kg (233 lb 14.5 oz) (05/22 0320)  Intake/Output from previous day: 05/21 0701 - 05/22 0700 In: 900 [I.V.:900] Out: 75 [Blood:75] Intake/Output this shift: Total I/O In: 900 [I.V.:900] Out: 75 [Blood:75]   Physical Exam   Constitutional: He is well-developed, well-nourished, and in no distress.  On ventilator  Cardiovascular: Normal rate and regular rhythm.  Pulmonary/Chest:  On vent.   Abdominal: Soft. He exhibits mass (suprapubic mass consistent with a dilated bladder. ).  Genitourinary:  Genitourinary Comments: Circumcised phallus with a small meatus. Scrotum, testes and epididymes normal. Rectal exam not done because of position on vent.   Musculoskeletal: He exhibits no edema or tenderness.  Neurological:  A/O x 0 on vent  Skin: Skin is warm and dry.    Lab Results:   Recent Labs  09/26/16 1851 09/26/16 1910 09/27/16 0210  WBC 11.4*  --  12.0*  HGB 13.0 13.6 10.8*  HCT 39.3 40.0 34.2*  PLT 324  --  310   BMET  Recent Labs  09/26/16 1851 09/26/16 1910 09/27/16 0210  NA 142 143 141  K 3.6 3.6 4.1  CL 107 107 109  CO2 23  --  22  GLUCOSE 132* 131* 171*  BUN 18 18 15   CREATININE 1.15 1.20 0.87  CALCIUM 9.7  --  8.3*   PT/INR  Recent Labs  09/26/16 1851  LABPROT 13.0  INR 0.98   ABG  Recent Labs  09/27/16 0035  PHART 7.290*  HCO3 22.6    Studies/Results: Ct Angio Head W Or Wo Contrast  Addendum Date: 09/26/2016   ADDENDUM REPORT: 09/26/2016 21:24 ADDENDUM: Acute findings discussed with and reconfirmed by Dr.Lindzen, Neurology on 09/26/2016 at 9:20 pm. Electronically Signed   By: Elon Alas M.D.   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 EXAM: CT ANGIOGRAPHY HEAD AND NECK TECHNIQUE: Multidetector CT imaging of the head and neck was performed using the standard protocol during bolus administration of intravenous contrast. Multiplanar CT image reconstructions and MIPs were obtained to evaluate the vascular anatomy. Carotid stenosis measurements (when applicable) are obtained utilizing NASCET criteria, using the distal internal carotid diameter as the  denominator. CONTRAST:  100 cc Isovue 370 COMPARISON:  CT HEAD Sep 26, 2016 1853 hours FINDINGS: CTA NECK AORTIC ARCH: Normal appearance of the thoracic arch, 2 vessel arch is a normal variant. Mild calcific atherosclerosis of the aortic arch. The origins of the innominate, left Common carotid artery and subclavian artery are widely patent. RIGHT CAROTID SYSTEM: Common carotid artery is widely patent. Normal appearance of the carotid bifurcation without hemodynamically significant stenosis by NASCET criteria, moderate calcific atherosclerosis. Normal appearance of the included internal carotid artery. LEFT CAROTID SYSTEM: Common carotid artery is widely patent. Normal appearance of the carotid bifurcation without hemodynamically significant stenosis by NASCET criteria, moderate calcific atherosclerosis. Central thrombus and occluded LEFT internal carotid artery within 17 mm of the origin. VERTEBRAL ARTERIES:Left vertebral artery is dominant. Patent vertebral artery's with extrinsic compression due to degenerative cervical spine. SKELETON: No acute osseous process though bone windows have not been submitted. Status post median sternotomy. OTHER NECK: Soft tissues of the neck are non-acute though, not tailored for evaluation. Severe mid cervical degenerative change resulting and severe bilateral C3-4, LEFT C4-5, bilateral C5-6 and moderate to severe C6-7 neural foraminal narrowing. CTA HEAD ANTERIOR CIRCULATION: LEFT internal carotid artery is occluded to the level of the carotid terminus, with focal central thrombus. Patent anterior communicating artery with diminutive anterior cerebral artery, LEFT A1 segment is patent. Occluded LEFT M1 origin with mid to distal LEFT M1 thready collateralization and intermittent occlusion. No contrast extravasation or aneurysm. POSTERIOR CIRCULATION: Patent vertebral arteries, vertebrobasilar junction and basilar artery, as well as main branch vessels. Patent posterior cerebral  arteries.  Robust RIGHT posterior communicating artery present. No large vessel occlusion, hemodynamically significant stenosis, dissection, luminal irregularity, contrast extravasation or aneurysm. VENOUS SINUSES: Major dural venous sinuses are patent though not tailored for evaluation on this angiographic examination. ANATOMIC VARIANTS: Fetal origin RIGHT posterior cerebral artery. DELAYED PHASE: Faint posterior insular enhancement. MIP images reviewed. IMPRESSION: CTA NECK:  Acute LEFT internal carotid artery occlusion. Atherosclerosis without hemodynamically significant stenosis. Multilevel severe neural foraminal narrowing. CTA HEAD: Occluded LEFT ICA with reconstitution LEFT carotid terminus. Occluded proximal LEFT M1 segment with thready collateralization. Diminutive RIGHT anterior cerebral artery favoring normal variant. Critical Value/emergent results were called by telephone at the time of interpretation on 09/26/2016 at 8:42 pm to Dr. Merrily Pew , who verbally acknowledged these results. Dr. Cheral Marker, Neurology paged via [place] [facility] paging system on 09/26/2016 at 8:45 pm, awaiting return call. Electronically Signed: By: Elon Alas M.D. On: 09/26/2016 20:52   Ct Head Wo Contrast  Result Date: 09/26/2016 CLINICAL DATA:  Follow-up exam status post endovascular revascularization. EXAM: CT HEAD WITHOUT CONTRAST TECHNIQUE: Contiguous axial images were obtained from the base of the skull through the vertex without intravenous contrast. COMPARISON:  Prior CTs from earlier the same day. FINDINGS: Brain: Study mildly degraded by motion artifact. Ill-defined hypodensity with loss of gray-white matter differentiation seen throughout portions of the left MCA territory, most evident at the left insular cortex, consistent with evolving acute left MCA territory infarct. Probable involvement of the left temporal lobe and posterior left frontoparietal region as well. Suspected early involvement of the  left caudate and lentiform nuclei as well. Scattered areas of serpiginous hyperdensity at the posterior left sylvian fissure most likely due to contrast staining from recent endovascular procedure. Superimposed small focus of gas present within this region (series 3, image 22). Adjacent hyperdensity may reflect calcification and/or postsurgical changes. Superimposed hemorrhage not entirely excluded. No significant mass effect at this time. No other acute large vessel territory infarct. No other evidence for acute intracranial hemorrhage. No mass lesion, midline shift or mass effect. No hydrocephalus. No extra-axial fluid collection. Vascular: Diffuse hyperdensity throughout the intracranial vascular sure, likely related two recent IV contrast. Skull: Scalp soft tissues and calvarium are unchanged, and within normal limits. Sinuses/Orbits: Globes and oval soft tissues within normal limits. Paranasal sinuses and mastoids are clear. Endotracheal tube partially visualized. Other: None. IMPRESSION: 1. Evolving acute left MCA territory infarct as above. Superimposed scattered serpiginous hyperdensity most likely reflects contrast staining from recent endovascular revascularization procedure. Superimposed hemorrhage not entirely excluded. Attention at follow-up recommended. 2. Otherwise stable appearance of the brain. Electronically Signed   By: Jeannine Boga M.D.   On: 09/26/2016 23:58   Ct Head Wo Contrast  Result Date: 09/26/2016 CLINICAL DATA:  Code stroke. Passed out earlier today. Unable to talk and move RIGHT side. EXAM: CT HEAD WITHOUT CONTRAST TECHNIQUE: Contiguous axial images were obtained from the base of the skull through the vertex without intravenous contrast. COMPARISON:  None. FINDINGS: The patient was unable to remain motionless for the exam. Small or subtle lesions could be overlooked. Brain: There is asymmetric hypoattenuation of the LEFT anterolateral temporal lobe and insula consistent with  early developing infarction. No other areas of concern for stroke are seen. There is no hemorrhage, mass lesion, hydrocephalus, or extra-axial fluid. Normal for age cerebral volume. Mild hypoattenuation of white matter could represent small vessel disease. Vascular: Abnormal hyperattenuation of the LEFT ICA terminus and proximal LEFT M1 MCA representing emergent large vessel occlusion. Skull: Normal. Negative for  fracture or focal lesion. Sinuses/Orbits: No acute finding. Other: None. ASPECTS [facility] Stroke Program Early CT Score) - Ganglionic level infarction (caudate, lentiform nuclei, internal capsule, insula, M1-M3 cortex): 5, loss of insula and M2 cortex. - Supraganglionic infarction (M4-M6 cortex): 3 Total score (0-10 with 10 being normal): 8 IMPRESSION: 1. Emergent large vessel occlusion affecting the LEFT ICA terminus and proximal LEFT MCA. 2. ASPECTS is 8, early hypoattenuation of the LEFT insula and LEFT anterolateral temporal cortex. These results were called by telephone at the time of interpretation on 09/26/2016 at 7:12 pm to Dr. Merrily Pew , who verbally acknowledged these results. Electronically Signed   By: Staci Righter M.D.   On: 09/26/2016 19:19   Ct Angio Neck W And/or Wo Contrast  Addendum Date: 09/26/2016   ADDENDUM REPORT: 09/26/2016 21:24 ADDENDUM: Acute findings discussed with and reconfirmed by Dr.Lindzen, Neurology on 09/26/2016 at 9:20 pm. Electronically Signed   By: Elon Alas M.D.   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 EXAM: CT ANGIOGRAPHY HEAD AND NECK TECHNIQUE: Multidetector CT imaging of the head and neck was performed using the standard protocol during bolus administration of intravenous contrast. Multiplanar CT image reconstructions and MIPs were obtained to evaluate the vascular anatomy. Carotid stenosis measurements (when applicable) are obtained  utilizing NASCET criteria, using the distal internal carotid diameter as the denominator. CONTRAST:  100 cc Isovue 370 COMPARISON:  CT HEAD Sep 26, 2016 1853 hours FINDINGS: CTA NECK AORTIC ARCH: Normal appearance of the thoracic arch, 2 vessel arch is a normal variant. Mild calcific atherosclerosis of the aortic arch. The origins of the innominate, left Common carotid artery and subclavian artery are widely patent. RIGHT CAROTID SYSTEM: Common carotid artery is widely patent. Normal appearance of the carotid bifurcation without hemodynamically significant stenosis by NASCET criteria, moderate calcific atherosclerosis. Normal appearance of the included internal carotid artery. LEFT CAROTID SYSTEM: Common carotid artery is widely patent. Normal appearance of the carotid bifurcation without hemodynamically significant stenosis by NASCET criteria, moderate calcific atherosclerosis. Central thrombus and occluded LEFT internal carotid artery within 17 mm of the origin. VERTEBRAL ARTERIES:Left vertebral artery is dominant. Patent vertebral artery's with extrinsic compression due to degenerative cervical spine. SKELETON: No acute osseous process though bone windows have not been submitted. Status post median sternotomy. OTHER NECK: Soft tissues of the neck are non-acute though, not tailored for evaluation. Severe mid cervical degenerative change resulting and severe bilateral C3-4, LEFT C4-5, bilateral C5-6 and moderate to severe C6-7 neural foraminal narrowing. CTA HEAD ANTERIOR CIRCULATION: LEFT internal carotid artery is occluded to the level of the carotid terminus, with focal central thrombus. Patent anterior communicating artery with diminutive anterior cerebral artery, LEFT A1 segment is patent. Occluded LEFT M1 origin with mid to distal LEFT M1 thready collateralization and intermittent occlusion. No contrast extravasation or aneurysm. POSTERIOR CIRCULATION: Patent vertebral arteries, vertebrobasilar junction and  basilar artery, as well as main branch vessels. Patent posterior cerebral arteries. Robust RIGHT posterior communicating artery present. No large vessel occlusion, hemodynamically significant stenosis, dissection, luminal irregularity, contrast extravasation or aneurysm. VENOUS SINUSES: Major dural venous sinuses are patent though not tailored for evaluation on this angiographic examination. ANATOMIC VARIANTS: Fetal origin RIGHT posterior cerebral artery. DELAYED PHASE: Faint posterior insular enhancement. MIP images reviewed. IMPRESSION: CTA NECK:  Acute LEFT internal carotid artery occlusion. Atherosclerosis without hemodynamically significant stenosis. Multilevel severe neural foraminal narrowing. CTA HEAD: Occluded  LEFT ICA with reconstitution LEFT carotid terminus. Occluded proximal LEFT M1 segment with thready collateralization. Diminutive RIGHT anterior cerebral artery favoring normal variant. Critical Value/emergent results were called by telephone at the time of interpretation on 09/26/2016 at 8:42 pm to Dr. Merrily Pew , who verbally acknowledged these results. Dr. Cheral Marker, Neurology paged via [facility] paging system on 09/26/2016 at 8:45 pm, awaiting return call. Electronically Signed: By: Elon Alas M.D. On: 09/26/2016 20:52   Dg Chest Port 1 View  Result Date: 09/27/2016 CLINICAL DATA:  Endotracheal tube placement.  Initial encounter. EXAM: PORTABLE CHEST 1 VIEW COMPARISON:  None. FINDINGS: The patient's endotracheal tube is seen ending 3-4 cm above the carina. The lungs are hypoexpanded. Vascular congestion is noted. Left basilar airspace opacity may reflect pneumonia. No pleural effusion or pneumothorax is seen. The cardiomediastinal silhouette is mildly enlarged. The patient is status post median sternotomy. There is a chronic fracture of the superior most sternal wire. No acute osseous abnormalities are identified. IMPRESSION: 1. Endotracheal tube seen ending 3-4 cm above the  carina. 2. Lungs hypoexpanded. Vascular congestion and mild cardiomegaly. Left basilar airspace opacity may reflect pneumonia. Electronically Signed   By: Garald Balding M.D.   On: 09/27/2016 00:43   Dg Abd Portable 1v  Result Date: 09/27/2016 CLINICAL DATA:  Orogastric tube placement.  Initial encounter. EXAM: PORTABLE ABDOMEN - 1 VIEW COMPARISON:  None. FINDINGS: The patient's enteric tube is noted ending overlying the body of the stomach, with the side port about the fundus of the stomach. The visualized bowel gas pattern is grossly unremarkable. Contrast is seen within the renal calyces. No acute osseous abnormalities are identified. Left basilar airspace opacity may reflect pneumonia. IMPRESSION: 1. Enteric tube noted ending overlying the body of stomach, with the side port about the fundus of the stomach. 2. Left basilar airspace opacity may reflect pneumonia. Electronically Signed   By: Garald Balding M.D.   On: 09/27/2016 00:44   Labs, Hospital notes and Urology records from [facility] reviewed.   SP Tube insertion note dictated. [id number]  Assessment: Urinary retention with history of stricture disease on CIC.     I elected based on a review of his urology notes from [facility] to place a Bonano SP tube.  This was done without complication.  He will  Need subsequent f/u for cystoscopy with urethral dilation.  He has been managed by Dr. Rolanda Lundborg at [facility] in the past.    CC: Dr. Kerney Elbe and Dr. Rolanda Lundborg at [facility].      Agness Sibrian J 09/27/2016 [phone]

## 2016-09-27 NOTE — Op Note (Signed)
NAMVerlin Sullivan:  Johnny Sullivan, Johnny Sullivan           ACCOUNT NO.:  [id number]  MEDICAL RECORD NO.:  [id number]  LOCATION:  [facility]                         FACILITY:  [facility]  PHYSICIAN:  Johnny Sullivan, M.D.    DATE OF BIRTH:  June 11, 1955  DATE OF PROCEDURE:  09/27/2016 DATE OF DISCHARGE:                              OPERATIVE REPORT   PROCEDURE:  Trocar suprapubic tube insertion.  PREOPERATIVE DIAGNOSIS:  Urinary retention, history of urethral stricture.  POSTOPERATIVE DIAGNOSIS:  Urinary retention, history of urethral stricture.  SURGEON:  Johnny SeltzerJohn J. Annabell HowellsWrenn, MD.  ANESTHESIA:  Local.  DRAIN:  A Bonanno suprapubic tube.  BLOOD LOSS:  None.  COMPLICATIONS:  None.  INDICATIONS:  [name] is a 61 year old white male, who was admitted with a stroke yesterday, he underwent tPA and is currently intubated.  He had a history of urethral stricture disease and a Foley was not placed on admission.  He was monitored with serial PVRs and the most recent one this morning was greater than 800 mL.  Nursing attempted to place 14 and 12-French Foley catheters without success, then I was called for assistance.  On review of his history, [facility] and had cystoscopy with urethral dilation for what was reported as dense panurethral stricture disease.  After reviewing those records, it was felt that placement of a trocar suprapubic tube would be the least traumatic and most appropriate option.  FINDINGS AND PROCEDURE:  He was placed in steep Trendelenburg.  His suprapubic area was prepped with Betadine solution.  His bladder was palpably distended.  He was draped with sterile towels.  Lidocaine 1% 5 mL was infiltrated in the area of the puncture site.  The Bonnano suprapubic tube was assembled on its 18-gauge trocar and passed without difficulty into the bladder where the catheter was slid off the trocar to allow the coil to form.  The suprapubic tube was advanced to its full extent.  The  trocar was removed and the urine specimen was obtained.  The trocar was then secured using 3-0 nylon to attach the base plate at 4 points.  The catheter was placed to straight drainage and a dressing was applied.  The patient was returned to the flat position.  There were no complications.     Johnny Sullivan, M.D.     JJW/MEDQ  D:  09/27/2016  T:  09/27/2016  Job:  [id number]

## 2016-09-27 NOTE — Progress Notes (Signed)
Wife states that patient drinks about 5 days of the week. On good day a couple of beers, on weekend more. Will add thiamine and folic acid.

## 2016-09-27 NOTE — Progress Notes (Signed)
OT Cancellation Note  Patient Details Name: Read Drivershillip Renaldo MRN: [id number] DOB: 01/07/1956   Cancelled Treatment:    Reason Eval/Treat Not Completed: Other (comment). Pt on strict bedrest. Please update activity orders to initiate therapy. Thanks  [facility] Center[facility],HILLARY  Cope Marte, OT/L  [phone] 09/27/2016 09/27/2016, 7:16 AM

## 2016-09-27 NOTE — Progress Notes (Signed)
STROKE TEAM PROGRESS NOTE   HISTORY OF PRESENT ILLNESS (per record) Read Drivershillip Gilreath is an 61 y.o. male who [facility] with stroke symptoms. LKN was 1815 this evening. 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 When found he was unable to talk. [facility], the patient was aphasic with flaccid right sided weakness.   CT head revealed an emergent large vessel occlusion affecting the left ICA terminus and proximal left MCA with associated early hypoattenuation of the left insula and left anterolateral temporal cortex. ASPECTS was 8.  Teleneurology consultation was obtained and IV tPA was recommended, followed by a STAT CTA head and [facility] possible [facility] based clot retrieval.   CTA completed at 8:09 PM, revealing a left ICA occlusion extending from just distal to the carotid bifurcation to just proximal to the carotid terminus. A short occluded segment of the proximal left MCA is also noted (preliminary interpretation by neurologist).   EKG at OSH showed no atrial fibrillation.   Per his wife, he takes daily aspirin and is on Lipitor 80 mg po qd. Formerly was on DAPT with Plavix and ASA for 2 years following previous CABG surgery.   LSN: [id number] tPA Given: Yes, at outside hospital. The patient received IV TPA on Monday, 09/26/2016 at 1915 hrs.   SUBJECTIVE (INTERVAL HISTORY) His wife and sons are at the bedside.  He was intubated on sedation. Able to open eyes on stimulation but not following commands. BP stable but on the low side. Neo PRN. On propofol. Will have sheath off soon. MRI pending. Family denies any heart palpitation at baseline.    OBJECTIVE Temp:  [97.8 F (36.6 C)-98.6 F (37 C)] 97.8 F (36.6 C) (05/21 2345) Pulse Rate:  [61-106] 67 (05/22 0747) Resp:  [7-26] 16 (05/22 0730) BP: (91-189)/(50-135) 124/61 (05/22 0730) SpO2:  [93 %-100 %] 100 % (05/22  0747) Arterial Line BP: (93-292)/(40-72) 140/53 (05/22 0730) FiO2 (%):  [40 %] 40 % (05/22 0747) Weight:  [104.3 kg (230 lb)-106.1 kg (233 lb 14.5 oz)] 106.1 kg (233 lb 14.5 oz) (05/22 0320)  CBC:  Recent Labs Lab 09/26/16 1851 09/26/16 1910 09/27/16 0210  WBC 11.4*  --  12.0*  NEUTROABS 7.8*  --  11.4*  HGB 13.0 13.6 10.8*  HCT 39.3 40.0 34.2*  MCV 83.6  --  84.7  PLT 324  --  310    Basic Metabolic Panel:  Recent Labs Lab 09/26/16 1851 09/26/16 1910 09/27/16 0210  NA 142 143 141  K 3.6 3.6 4.1  CL 107 107 109  CO2 23  --  22  GLUCOSE 132* 131* 171*  BUN 18 18 15   CREATININE 1.15 1.20 0.87  CALCIUM 9.7  --  8.3*  MG  --   --  2.2  PHOS  --   --  4.3    Lipid Panel:    Component Value Date/Time   CHOL 108 09/27/2016 0210   TRIG 83 09/27/2016 0210   TRIG 82 09/27/2016 0210   HDL 33 (L) 09/27/2016 0210   CHOLHDL 3.3 09/27/2016 0210   VLDL 17 09/27/2016 0210   LDLCALC 58 09/27/2016 0210   HgbA1c: No results found for: HGBA1C Urine Drug Screen:    Component Value Date/Time   LABOPIA NONE DETECTED 09/27/2016 0630   COCAINSCRNUR NONE DETECTED 09/27/2016 0630   LABBENZ NONE DETECTED 09/27/2016 0630   AMPHETMU NONE DETECTED  09/27/2016 0630   THCU NONE DETECTED 09/27/2016 0630   LABBARB NONE DETECTED 09/27/2016 0630    Alcohol Level     Component Value Date/Time   ETH <5 09/26/2016 1851    IMAGING I have personally reviewed the radiological images below and agree with the radiology interpretations.  Ct Angio Head W Or Wo Contrast Ct Angio Neck W And/or Wo Contrast 09/26/2016 CTA NECK:   Acute LEFT internal carotid artery occlusion. Atherosclerosis without hemodynamically significant stenosis. Multilevel severe neural foraminal narrowing. CTA HEAD:  Occluded LEFT ICA with reconstitution LEFT carotid terminus. Occluded proximal LEFT M1 segment with thready collateralization. Diminutive RIGHT anterior cerebral artery favoring normal variant.   Ct Head Wo  Contrast 09/26/2016 1. Evolving acute left MCA territory infarct as above. Superimposed scattered serpiginous hyperdensity most likely reflects contrast staining from recent endovascular revascularization procedure. Superimposed hemorrhage not entirely excluded. Attention at follow-up recommended.  2. Otherwise stable appearance of the brain.   Ct Head Wo Contrast 09/26/2016 1. Emergent large vessel occlusion affecting the LEFT ICA terminus and proximal LEFT MCA.  2. ASPECTS is 8, early hypoattenuation of the LEFT insula and LEFT anterolateral temporal cortex.   Cerebral angiogram S/P Lt common carotid arteriogram followed by endovascular revascularization of occluded LT ICA terminus,Lt MCA and Lt ACA with x 2 passes with Solitaire  FR 40 MM retrieval device achieving a TICI 3 reperfusion. Groin puncture to TICI 2b  26 mins, and to TICI 3 .   PHYSICAL EXAM  Temp:  [97.8 F (36.6 C)-98.6 F (37 C)] 98.1 F (36.7 C) (05/22 1200) Pulse Rate:  [61-106] 85 (05/22 1530) Resp:  [7-28] 21 (05/22 1530) BP: (91-189)/(50-135) 134/67 (05/22 1530) SpO2:  [93 %-100 %] 100 % (05/22 1530) Arterial Line BP: (93-292)/(40-72) 155/54 (05/22 1530) FiO2 (%):  [40 %] 40 % (05/22 1210) Weight:  [230 lb (104.3 kg)-233 lb 14.5 oz (106.1 kg)] 233 lb 14.5 oz (106.1 kg) (05/22 0320)  General - Well nourished, well developed, intubated on sedation.  Ophthalmologic - Fundi not visualized due to noncooperation.  Cardiovascular - Regular rate and rhythm.  Neuro - intubated on sedation, open eyes on voice, able to keep eyes open, but not able to follow commands but able to pantomime. Left pupil slightly smaller than right, but both reactive to light. Left gaze preference and right neglect, not blinking to right visual threat. Positive corneal and gag, not able to check facial symmetry due to ET tube. Pt did not open mouth on request. LUE and LLE spontaneous movement against gravity. RUE slight withdraw on  pain. RLE able to withdraw but limited testing due to right groin sheath. DTR 1+ and no babinski. Sensation, coordination and gag not tested.    ASSESSMENT/PLAN Mr. Myshawn Chiriboga is a 61 y.o. male with history of CAD with previous MI s/p CABG in 2014, hypertension, ureteral stricture and GERD presenting with aphasia and right hemiplegia. The patient received IV TPA on Monday, 09/26/2016 at 1915 hrs.  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 Superimposed small hemorrhagic transformation  MRI head - pending  MRA head -  Pending  CTA H&N - Acute LEFT ICA occlusion. Occluded proximal LEFT M1.   Cerebral angio - TICI3 of left ICA and left MCA as well as left ACA  2D Echo -  pending  Consider TEE and loop once stabilized.  LDL - 58  HgbA1c pending  VTE prophylaxis - SCDs  Diet NPO time specified  aspirin 81 mg daily prior to admission, now on No antithrombotic secondary to TPA therapy.   Patient counseled to be compliant with his antithrombotic medications  Ongoing aggressive stroke risk factor management  Therapy recommendations: pending  Disposition: Pending  Hypertension  Stable  BP goal 120-160 after procedure  Long-term BP goal normotensive  Hyperlipidemia  Home meds: Lipitor 80 mg daily prior to admission  LDL 58, goal < 70  lipitor resumed in hospital.  Continue statin at discharge  urethral stricture  Urinary retention  S/p suprapubic catheter  Alcohol use  Average daily 2-3 drinks  On B1/FA/MVI  CIWA protocol  Seizure precaution  Other Stroke Risk Factors  Advanced age  Obesity, Body mass index is 34.54 kg/m., recommend weight loss, diet and exercise as appropriate   CAD / MI s/p CABG in 2014  Other Active Problems  Mild  leukocytosis  Mild anemia  Hospital day # 1  This patient is critically ill due to left MCA infarct with left ICA and MCA occlusion s/p tPA and IR procedure and at significant risk of neurological worsening, death form recurrent stroke, hemorrhagic transformation, heart failure, seizure, vasospasm. This patient's care requires constant monitoring of vital signs, hemodynamics, respiratory and cardiac monitoring, review of multiple databases, neurological assessment, discussion with family, other specialists and medical decision making of high complexity. I spent 45 minutes of neurocritical care time in the care of this patient. I had long discussion with family at bedside, updated pt condition, reviewed images with them, discussed about treatment plan. They expressed understanding.  Marvel Plan, MD PhD Stroke Neurology 09/27/2016 4:02 PM  To contact Stroke Continuity provider, please refer to [email or web address]. After hours, contact General Neurology

## 2016-09-27 NOTE — Progress Notes (Signed)
BEEPER 1852 PT IN CT 1850 EXAM FINISHED/SENT TO SOC 1900 COMPLETE IN EPIC CALLED GR 1901

## 2016-09-27 NOTE — Progress Notes (Signed)
Right 9 French femoral sheath removed by Pearlean BrownieMelinda Alewine, RT.  Exoseal closure placed.  Manual pressure held for 15 minutes.  Pedal pulses present.  Pt tolerating well.  VSS.   Site and pulses reviewed with Asher MuirJamie, RN

## 2016-09-27 NOTE — Progress Notes (Signed)
PT Cancellation Note  Patient Details Name: Read Drivershillip Hardacre MRN: [id number] DOB: 05/03/1956   Cancelled Treatment:    Reason Eval/Treat Not Completed: Patient not medically ready Pt on bedrest. Will await increase in activity orders prior to PT evaluation.   Blake DivineShauna A Shawnese Magner 09/27/2016, 8:26 AM  Mylo RedShauna Kaylen Motl, PT, DPT [phone]

## 2016-09-27 NOTE — Care Management Note (Signed)
Case Management Note  Patient Details  Name: Johnny Sullivan MRN: [id number] Date of Birth: 09/01/1955  Subjective/Objective:   Pt admitted on 09/26/16 with Lt MCA stroke secondary to Lt ICA occlusion and secondary focal occlusion of proximal Lt MCA.  PTA, pt independent of ADLS and living at home with spouse.                   Action/Plan: Pt currently remains intubated; wife and two sons at bedside.  Will follow for discharge planning as pt progresses.    Expected Discharge Date:                  Expected Discharge Plan:  IP Rehab Facility  In-House Referral:     Discharge planning Services  CM Consult  Post Acute Care Choice:    Choice offered to:     DME Arranged:    DME Agency:     HH Arranged:    HH Agency:     Status of Service:  In process, will continue to follow  If discussed at Long Length of Stay Meetings, dates discussed:    Additional Comments:  Quintella BatonJulie W. Sahira Cataldi, RN, BSN  Trauma/Neuro ICU Case Manager [phone]

## 2016-09-28 ENCOUNTER — Inpatient Hospital Stay (HOSPITAL_COMMUNITY): Payer: BLUE CROSS/BLUE SHIELD

## 2016-09-28 ENCOUNTER — Encounter (HOSPITAL_COMMUNITY): Payer: Self-pay | Admitting: Interventional Radiology

## 2016-09-28 DIAGNOSIS — I6522 Occlusion and stenosis of left carotid artery: Secondary | ICD-10-CM

## 2016-09-28 DIAGNOSIS — F1010 Alcohol abuse, uncomplicated: Secondary | ICD-10-CM

## 2016-09-28 DIAGNOSIS — I6300 Cerebral infarction due to thrombosis of unspecified precerebral artery: Secondary | ICD-10-CM

## 2016-09-28 LAB — BASIC METABOLIC PANEL
Anion gap: 6 (ref 5–15)
BUN: 17 mg/dL (ref 6–20)
CO2: 25 mmol/L (ref 22–32)
Calcium: 8.2 mg/dL — ABNORMAL LOW (ref 8.9–10.3)
Chloride: 109 mmol/L (ref 101–111)
Creatinine, Ser: 0.79 mg/dL (ref 0.61–1.24)
GFR calc Af Amer: >60 mL/min (ref >60)
GFR calc non Af Amer: >60 mL/min (ref >60)
Glucose, Bld: 149 mg/dL — ABNORMAL HIGH (ref 65–99)
POTASSIUM: 4.7 mmol/L (ref 3.5–5.1)
SODIUM: 140 mmol/L (ref 135–145)

## 2016-09-28 LAB — CBC
HCT: 33 % — ABNORMAL LOW (ref 39.0–52.0)
Hemoglobin: 10 g/dL — ABNORMAL LOW (ref 13.0–17.0)
MCH: 26.2 pg (ref 26.0–34.0)
MCHC: 30.3 g/dL (ref 30.0–36.0)
MCV: 86.4 fL (ref 78.0–100.0)
Platelets: 324 10*3/uL (ref 150–400)
RBC: 3.82 MIL/uL — ABNORMAL LOW (ref 4.22–5.81)
RDW: 15.5 % (ref 11.5–15.5)
WBC: 15.5 10*3/uL — ABNORMAL HIGH (ref 4.0–10.5)

## 2016-09-28 LAB — HIV ANTIBODY (ROUTINE TESTING W REFLEX): HIV Screen 4th Generation wRfx: NONREACTIVE

## 2016-09-28 LAB — HEMOGLOBIN A1C
Hgb A1c MFr Bld: 6.3 % — ABNORMAL HIGH (ref 4.8–5.6)
Mean Plasma Glucose: 134 mg/dL

## 2016-09-28 MED ORDER — PANTOPRAZOLE SODIUM 40 MG IV SOLR
40.0000 mg | Freq: Two times a day (BID) | INTRAVENOUS | Status: DC
  Administered 2016-09-28 – 2016-09-30 (×6): 40 mg via INTRAVENOUS
  Filled 2016-09-28 (×7): qty 40

## 2016-09-28 MED ORDER — ASPIRIN EC 325 MG PO TBEC
325.0000 mg | DELAYED_RELEASE_TABLET | Freq: Every day | ORAL | Status: DC
  Filled 2016-09-28: qty 1

## 2016-09-28 MED ORDER — ASPIRIN 300 MG RE SUPP: 300.0000 mg | Freq: Every day | RECTAL | Status: DC

## 2016-09-28 MED ORDER — ASPIRIN 325 MG PO TABS
325.0000 mg | ORAL_TABLET | Freq: Every day | ORAL | Status: DC
  Administered 2016-09-28 – 2016-10-01 (×4): 325 mg via ORAL
  Filled 2016-09-28 (×4): qty 1

## 2016-09-28 MED ORDER — ENOXAPARIN SODIUM 40 MG/0.4ML ~~LOC~~ SOLN
40.0000 mg | SUBCUTANEOUS | Status: DC
  Administered 2016-09-28: 40 mg via SUBCUTANEOUS
  Filled 2016-09-28 (×2): qty 0.4

## 2016-09-28 NOTE — Evaluation (Signed)
Clinical/Bedside Swallow Evaluation Patient Details  Name: Johnny Sullivan MRN: [id number] Date of Birth: 08/19/1955  Today's Date: 09/28/2016 Time: SLP Start Time (ACUTE ONLY): 1354 SLP Stop Time (ACUTE ONLY): 1404 SLP Time Calculation (min) (ACUTE ONLY): 10 min  Past Medical History:  Past Medical History:  Diagnosis Date  . Arthritis    Osteoarthritis- right hip  . Coronary artery disease   . GERD (gastroesophageal reflux disease)   . Hypertension   . Myocardial infarction (HCC)    " mild"-Dr. Abbey ChattersZakhary,cardiolgy-[place] follows -gives clearance.  Marland Kitchen. Urethral stricture    'uses self [place] caths weekly" to correct.   Past Surgical History:  Past Surgical History:  Procedure Laterality Date  . BACK SURGERY     lumbar discectomy  . CARDIAC CATHETERIZATION    . CORONARY ARTERY BYPASS GRAFT     09-20-2012 x3 -[place]  . IR PERCUTANEOUS ART THROMBECTOMY/INFUSION INTRACRANIAL INC DIAG ANGIO  09/26/2016  . RADIOLOGY WITH ANESTHESIA N/A 09/26/2016   Procedure: RADIOLOGY WITH ANESTHESIA;  Surgeon: Julieanne Cottoneveshwar, Sanjeev, MD;  Location: [facility] OR;  Service: Radiology;  Laterality: N/A;  . TOTAL HIP ARTHROPLASTY Right 04/26/2016   Procedure: RIGHT TOTAL HIP ARTHROPLASTY ANTERIOR APPROACH;  Surgeon: Durene RomansMatthew Olin, MD;  Location: [facility] ORS;  Service: Orthopedics;  Laterality: Right;  Marland Kitchen. VASECTOMY    . WRIST SURGERY     "implant of plastic bone"-mild ROM limits   HPI:  Pt is a 61 year old male [facility] as a code stroke. He presented with R sided weakness, difficulty speaking, and swelling to R side of tongue with MRI showing an acute large L MCA infarct with petechial hemorrhage, s/p tPA. He was intubated 5/21-5/23. PMH includes: CAD s/p CABG 2014, GERD, HTN, MI, urethral stricture, and recent admission in December 2017 for right total hip replacement.    Assessment / Plan / Recommendation Clinical Impression  Pt's vocal quality is clear s/p brief intubation. He has R  sided facial weakness that impacts labial seal, resulting in intermittent anterior loss, with pt sensation only of larger amounts. Throat clearing and coughing are elicited both immediately and with mild delay after trials of ice chips and water, and he has several subswallows per bolus. With trials of puree, he consistently uses only one additional subswallow per bite and there are no overt signs of aspiraiton. Recommend to remain NPO for today except for essential medications crushed in applesauce. Will f/u on next date for additional PO trials and likely need for FEES. SLP Visit Diagnosis: Dysphagia, oropharyngeal phase (R13.12)    Aspiration Risk  Moderate aspiration risk    Diet Recommendation NPO except meds   Medication Administration: Crushed with puree    Other  Recommendations Oral Care Recommendations: Oral care QID   Follow up Recommendations Inpatient Rehab      Frequency and Duration min 2x/week  2 weeks       Prognosis Prognosis for Safe Diet Advancement: Good Barriers to Reach Goals: Language deficits      Swallow Study   General HPI: Pt is a 61 [facility] old [facility] code stroke. He presented with R sided weakness, difficulty speaking, and swelling to R side of tongue with MRI showing an acute large L MCA infarct with petechial hemorrhage, s/p tPA. He was intubated 5/21-5/23. PMH includes: CAD s/p CABG 2014, GERD, HTN, MI, urethral stricture, and recent admission in December 2017 for right total hip replacement.  Type of Study: Bedside Swallow  Evaluation Previous Swallow Assessment: none in chart Diet Prior to this Study: NPO Temperature Spikes Noted: No Respiratory Status: Room air History of Recent Intubation: Yes Length of Intubations (days): 2 days Date extubated: 09/28/16 Behavior/Cognition: Alert;Cooperative;Pleasant mood;Requires cueing Oral Cavity Assessment: Within Functional Limits Oral Cavity - Dentition: Adequate  natural dentition Vision: Functional for self-feeding Self-Feeding Abilities: Able to feed self Patient Positioning: Upright in bed Baseline Vocal Quality: Normal Volitional Cough: Weak Volitional Swallow: Unable to elicit    Oral/Motor/Sensory Function Overall Oral Motor/Sensory Function: Moderate impairment Facial ROM: Reduced right;Suspected CN VII (facial) dysfunction Facial Symmetry: Abnormal symmetry right;Suspected CN VII (facial) dysfunction Facial Strength: Reduced right;Suspected CN VII (facial) dysfunction Lingual ROM: Within Functional Limits Lingual Symmetry: Abnormal symmetry right;Suspected CN XII (hypoglossal) dysfunction Mandible: Within Functional Limits   Ice Chips Ice chips: Impaired Presentation: Spoon Pharyngeal Phase Impairments: Throat Clearing - Immediate   Thin Liquid Thin Liquid: Impaired Presentation: Cup;Self Fed;Spoon Oral Phase Impairments: Reduced labial seal Oral Phase Functional Implications: Right anterior spillage Pharyngeal  Phase Impairments: Multiple swallows;Cough - Immediate;Cough - Delayed    Nectar Thick Nectar Thick Liquid: Not tested   Honey Thick Honey Thick Liquid: Not tested   Puree Puree: Impaired Presentation: Spoon Pharyngeal Phase Impairments: Multiple swallows   Solid   GO   Solid: Not tested        Maxcine Ham 09/28/2016,3:11 PM  Maxcine Ham, M.A. CCC-SLP [phone]

## 2016-09-28 NOTE — Progress Notes (Signed)
STROKE TEAM PROGRESS NOTE   SUBJECTIVE (INTERVAL HISTORY) His wife and sons are at the bedside.  He is still intubated, off sedation, awake and alert. Moving all extremities, but still weak at RUE. Feels that he has aphasia. Plan to extubate today.     OBJECTIVE Temp:  [97.8 F (36.6 C)-98.6 F (37 C)] 98.2 F (36.8 C) (05/23 0400) Pulse Rate:  [58-89] 61 (05/23 0730) Resp:  [12-28] 16 (05/23 0730) BP: (95-182)/(53-77) 109/63 (05/23 0715) SpO2:  [97 %-100 %] 98 % (05/23 0730) Arterial Line BP: (99-194)/(40-80) 144/59 (05/23 0700) FiO2 (%):  [40 %] 40 % (05/23 0354) Weight:  [107.9 kg (237 lb 14 oz)] 107.9 kg (237 lb 14 oz) (05/23 0500)  CBC:  Recent Labs Lab 09/26/16 1851  09/27/16 0210 09/28/16 0500  WBC 11.4*  --  12.0* 15.5*  NEUTROABS 7.8*  --  11.4*  --   HGB 13.0  < > 10.8* 10.0*  HCT 39.3  < > 34.2* 33.0*  MCV 83.6  --  84.7 86.4  PLT 324  --  310 324  < > = values in this interval not displayed.  Basic Metabolic Panel:   Recent Labs Lab 09/27/16 0210 09/28/16 0500  NA 141 140  K 4.1 4.7  CL 109 109  CO2 22 25  GLUCOSE 171* 149*  BUN 15 17  CREATININE 0.87 0.79  CALCIUM 8.3* 8.2*  MG 2.2  --   PHOS 4.3  --     Lipid Panel:     Component Value Date/Time   CHOL 108 09/27/2016 0210   TRIG 83 09/27/2016 0210   TRIG 82 09/27/2016 0210   HDL 33 (L) 09/27/2016 0210   CHOLHDL 3.3 09/27/2016 0210   VLDL 17 09/27/2016 0210   LDLCALC 58 09/27/2016 0210   HgbA1c:  Lab Results  Component Value Date   HGBA1C 6.3 (H) 09/27/2016   Urine Drug Screen:     Component Value Date/Time   LABOPIA NONE DETECTED 09/27/2016 0630   COCAINSCRNUR NONE DETECTED 09/27/2016 0630   LABBENZ NONE DETECTED 09/27/2016 0630   AMPHETMU NONE DETECTED 09/27/2016 0630   THCU NONE DETECTED 09/27/2016 0630   LABBARB NONE DETECTED 09/27/2016 0630    Alcohol Level     Component Value Date/Time   ETH <5 09/26/2016 1851    IMAGING I have personally reviewed the radiological  images below and agree with the radiology interpretations.  Ct Angio Head W Or Wo Contrast Ct Angio Neck W And/or Wo Contrast 09/26/2016 CTA NECK:   Acute LEFT internal carotid artery occlusion. Atherosclerosis without hemodynamically significant stenosis. Multilevel severe neural foraminal narrowing. CTA HEAD:  Occluded LEFT ICA with reconstitution LEFT carotid terminus. Occluded proximal LEFT M1 segment with thready collateralization. Diminutive RIGHT anterior cerebral artery favoring normal variant.   Ct Head Wo Contrast 09/26/2016 1. Evolving acute left MCA territory infarct as above. Superimposed scattered serpiginous hyperdensity most likely reflects contrast staining from recent endovascular revascularization procedure. Superimposed hemorrhage not entirely excluded. Attention at follow-up recommended.  2. Otherwise stable appearance of the brain.   Ct Head Wo Contrast 09/26/2016 1. Emergent large vessel occlusion affecting the LEFT ICA terminus and proximal LEFT MCA.  2. ASPECTS is 8, early hypoattenuation of the LEFT insula and LEFT anterolateral temporal cortex.   Cerebral angiogram S/P Lt common carotid arteriogram followed by endovascular revascularization of occluded LT ICA terminus,Lt MCA and Lt ACA with x 2 passes with Solitaire  FR 40 MM retrieval device achieving a TICI  3 reperfusion. Groin puncture to TICI 2b  26 mins, and to TICI 3 .  Ct Head Wo Contrast 09/28/2016 IMPRESSION: 1. Continued expected evolution of left MCA infarct without new acute finding. 2. Trace residual hyperdensity in this area is favored to indicate contrast staining. Continued attention on follow-up is recommended to exclude hemorrhage.   Mri and Mra Brain Wo Contrast 09/27/2016 IMPRESSION: MRI HEAD: Acute large LEFT MCA territory infarct with petechial hemorrhage. Mild chronic small vessel ischemic disease. MRA HEAD: Successful re- vascularization of LEFT internal carotid artery. Severe  stenosis LEFT M2 vs. M3 origin, possible residual thromboembolism.   TTE - Left ventricle: The cavity size was normal. There was mild   concentric hypertrophy. Systolic function was normal. The   estimated ejection fraction was in the range of 50% to 55%.   Dyskinesis of the apical myocardium; consistent with infarction   in the distribution of the left anterior descending coronary   artery. Doppler parameters are consistent with abnormal left   ventricular relaxation (grade 1 diastolic dysfunction). Doppler   parameters are consistent with elevated mean left atrial filling   pressure. Acoustic contrast opacification revealed no evidence   ofthrombus. Although there is no visible thrombus, there is very   slow, swirling flow in the dyskinetic apex. - Aortic valve: Noncoronary cusp mobility was severely restricted.   There was mild regurgitation. - Mitral valve: Calcified annulus. - Left atrium: The atrium was mildly dilated.  PHYSICAL EXAM  Temp:  [97.8 F (36.6 C)-98.6 F (37 C)] 98.2 F (36.8 C) (05/23 0400) Pulse Rate:  [58-89] 61 (05/23 0730) Resp:  [12-28] 16 (05/23 0730) BP: (95-182)/(53-77) 109/63 (05/23 0715) SpO2:  [97 %-100 %] 98 % (05/23 0730) Arterial Line BP: (99-194)/(40-80) 144/59 (05/23 0700) FiO2 (%):  [40 %] 40 % (05/23 0354) Weight:  [107.9 kg (237 lb 14 oz)] 107.9 kg (237 lb 14 oz) (05/23 0500)  General - Well nourished, well developed, intubated off sedation.  Ophthalmologic - Fundi not visualized due to noncooperation.  Cardiovascular - Regular rate and rhythm.  Neuro - intubated off sedation, eyes open, only able to close eyes on command, but not able to follow other commands but able to pantomime. PERRL, Left gaze preference but able to attend both sides, inconsistent blinking to bilateral visual threat. Positive corneal and gag, not able to check facial symmetry due to ET tube. Pt did not open mouth on request. LUE and LLE and RLE spontaneous movement  against gravity. RUE 3/5 proximal and distal. DTR 1+ and no babinski. Sensation, coordination and gag not tested.    ASSESSMENT/PLAN [name] is a 61 y.o. male with history of CAD with previous MI s/p CABG in 2014, hypertension, ureteral stricture and GERD presenting with aphasia and right hemiplegia. The patient received IV TPA on Monday, 09/26/2016 at 1915 hrs.  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 Superimposed small hemorrhagic transformation  CTA H&N - Acute LEFT ICA occlusion. Occluded proximal LEFT M1.   Cerebral angio - TICI3 of left ICA and left MCA as well as left ACA  MRI head - Acute large LEFT MCA territory infarct with petechial hemorrhage  MRA head -  Left brain luxury perfusion  2D Echo - EF 50-55%  Consider TEE and loop once stabilized.  LDL - 58  HgbA1c pending  VTE prophylaxis - lovenox Diet NPO time specified  aspirin 81 mg daily prior to admission, now on ASA 325mg  or ASA PR 300mg  daily  Patient counseled to be compliant with his antithrombotic medications  Ongoing aggressive stroke risk factor management  Therapy recommendations: pending  Disposition: Pending  Respiratory failure  Intubated for procedure  CCM on board  Plan for extubation today  Left ICA occlusion   S/p thrombectomy  Concerning for embolic event  Consider TEE and loop when stable  Hypertension  Stable  BP goal 120-160 after procedure  Long-term BP goal normotensive  Hyperlipidemia  Home meds: Lipitor 80 mg daily prior to admission  LDL 58, goal < 70  lipitor resumed in hospital.  Continue statin at discharge  urethral stricture  Urinary retention  S/p suprapubic catheter  Alcohol use  Average daily 2-3 drinks  On B1/FA/MVI  CIWA  protocol  Seizure precaution  Other Stroke Risk Factors  Advanced age  Obesity, Body mass index is 35.13 kg/m., recommend weight loss, diet and exercise as appropriate   CAD / MI s/p CABG in 2014  Other Active Problems  Mild leukocytosis  Mild anemia  Hospital day # 2  This patient is critically ill due to left MCA infarct with left ICA and MCA occlusion s/p tPA and IR procedure and at significant risk of neurological worsening, death form recurrent stroke, hemorrhagic transformation, heart failure, seizure, vasospasm. This patient's care requires constant monitoring of vital signs, hemodynamics, respiratory and cardiac monitoring, review of multiple databases, neurological assessment, discussion with family, other specialists and medical decision making of high complexity. I spent 40 minutes of neurocritical care time in the care of this patient. I had long discussion with family at bedside, updated pt condition, reviewed images with them, discussed about treatment plan. They expressed understanding.  Marvel PlanJindong Nikhita Mentzel, MD PhD Stroke Neurology 09/28/2016 2:43 PM  To contact Stroke Continuity provider, please refer to [email or web address]. After hours, contact General Neurology

## 2016-09-28 NOTE — Progress Notes (Signed)
Spoke with Dr. Otelia LimesLindzen, neurologist and notified MD that MRI results are back. MD stated to hold off on administering ASA due to petechial hemorrhages. CT head ordered for tomorrow. Will continue to monitor.

## 2016-09-28 NOTE — Progress Notes (Signed)
Patient ID: Johnny Sullivan, male   DOB: 06/26/1955, 61 y.o.   MRN: [id number]    Referring Physician(s): Dr. Marvel PlanJindong Xu  Supervising Physician: Julieanne Cottoneveshwar, Sanjeev  [facility]  Chief Complaint: CVA  Subjective: Patient intubated, but does awaken.  Allergies: Alteplase and Vancomycin  Medications: Prior to Admission medications   Medication Sig Start Date End Date Taking? Authorizing Provider  aspirin EC 81 MG tablet Take 81 mg by mouth daily.   Yes [provider]  atorvastatin (LIPITOR) 80 MG tablet Take 80 mg by mouth daily at 6 PM.  03/23/16  Yes [provider]  hydrochlorothiazide (HYDRODIURIL) 25 MG tablet Take 25 mg by mouth daily as needed (fluid retention).  04/11/16  Yes [provider]  metoprolol tartrate (LOPRESSOR) 25 MG tablet Take 25 mg by mouth 2 (two) times daily.  03/23/16  Yes [provider]  ranitidine (ZANTAC) 150 MG capsule Take 150 mg by mouth daily as needed for heartburn.    Yes [provider]  amoxicillin (AMOXIL) 500 MG capsule Take 2,000 mg by mouth See admin instructions. ONE HOUR PRIOR TO DENTAL APPOINTMENT 09/23/16   [provider]    Vital Signs: BP 127/63   Pulse 65   Temp 98.5 F (36.9 C) (Axillary)   Resp 12   Ht 5\' 9"  (1.753 m)   Wt 237 lb 14 oz (107.9 kg)   SpO2 100%   BMI 35.13 kg/m   Physical Exam: Ext: right CFA site intact with no evidence of bleeding or hematoma. Neuro: opens eyes.  Follows commands to move arms and legs.  Imaging: Ct Angio Head W Or Wo Contrast  Addendum Date: 09/26/2016   ADDENDUM REPORT: 09/26/2016 21:24 ADDENDUM: Acute findings discussed with and reconfirmed by Dr.Lindzen, Neurology on 09/26/2016 at 9:20 pm. Electronically Signed   By: Awilda Metroourtnay  Bloomer M.D.   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 EXAM: CT ANGIOGRAPHY HEAD  AND NECK TECHNIQUE: Multidetector CT imaging of the head and neck was performed using the standard protocol during bolus administration of intravenous contrast. Multiplanar CT image reconstructions and MIPs were obtained to evaluate the vascular anatomy. Carotid stenosis measurements (when applicable) are obtained utilizing NASCET criteria, using the distal internal carotid diameter as the denominator. CONTRAST:  100 cc Isovue 370 COMPARISON:  CT HEAD Sep 26, 2016 1853 hours FINDINGS: CTA NECK AORTIC ARCH: Normal appearance of the thoracic arch, 2 vessel arch is a normal variant. Mild calcific atherosclerosis of the aortic arch. The origins of the innominate, left Common carotid artery and subclavian artery are widely patent. RIGHT CAROTID SYSTEM: Common carotid artery is widely patent. Normal appearance of the carotid bifurcation without hemodynamically significant stenosis by NASCET criteria, moderate calcific atherosclerosis. Normal appearance of the included internal carotid artery. LEFT CAROTID SYSTEM: Common carotid artery is widely patent. Normal appearance of the carotid bifurcation without hemodynamically significant stenosis by NASCET criteria, moderate calcific atherosclerosis. Central thrombus and occluded LEFT internal carotid artery within 17 mm of the origin. VERTEBRAL ARTERIES:Left vertebral artery is dominant. Patent vertebral artery's with extrinsic compression due to degenerative cervical spine. SKELETON: No acute osseous process though bone windows have not been submitted. Status post median sternotomy. OTHER NECK: Soft tissues of the neck are non-acute though, not tailored for evaluation. Severe mid cervical degenerative change resulting and severe bilateral C3-4, LEFT C4-5, bilateral C5-6 and moderate to severe  C6-7 neural foraminal narrowing. CTA HEAD ANTERIOR CIRCULATION: LEFT internal carotid artery is occluded to the level of the carotid terminus, with focal central thrombus. Patent  anterior communicating artery with diminutive anterior cerebral artery, LEFT A1 segment is patent. Occluded LEFT M1 origin with mid to distal LEFT M1 thready collateralization and intermittent occlusion. No contrast extravasation or aneurysm. POSTERIOR CIRCULATION: Patent vertebral arteries, vertebrobasilar junction and basilar artery, as well as main branch vessels. Patent posterior cerebral arteries. Robust RIGHT posterior communicating artery present. No large vessel occlusion, hemodynamically significant stenosis, dissection, luminal irregularity, contrast extravasation or aneurysm. VENOUS SINUSES: Major dural venous sinuses are patent though not tailored for evaluation on this angiographic examination. ANATOMIC VARIANTS: Fetal origin RIGHT posterior cerebral artery. DELAYED PHASE: Faint posterior insular enhancement. MIP images reviewed. IMPRESSION: CTA NECK:  Acute LEFT internal carotid artery occlusion. Atherosclerosis without hemodynamically significant stenosis. Multilevel severe neural foraminal narrowing. CTA HEAD: Occluded LEFT ICA with reconstitution LEFT carotid terminus. Occluded proximal LEFT M1 segment with thready collateralization. Diminutive RIGHT anterior cerebral artery favoring normal variant. Critical Value/emergent results were called by telephone at the time of interpretation on 09/26/2016 at 8:42 pm to Dr. Marily Memos , who verbally acknowledged these results. Dr. Otelia Limes, Neurology paged via [facility] secure hospital paging system on 09/26/2016 at 8:45 pm, awaiting return call. Electronically Signed: By: Awilda Metro M.D. On: 09/26/2016 20:52   Ct Head Wo Contrast  Result Date: 09/26/2016 CLINICAL DATA:  Follow-up exam status post endovascular revascularization. EXAM: CT HEAD WITHOUT CONTRAST TECHNIQUE: Contiguous axial images were obtained from the base of the skull through the vertex without intravenous contrast. COMPARISON:  Prior CTs from earlier the same day. FINDINGS: Brain:  Study mildly degraded by motion artifact. Ill-defined hypodensity with loss of gray-white matter differentiation seen throughout portions of the left MCA territory, most evident at the left insular cortex, consistent with evolving acute left MCA territory infarct. Probable involvement of the left temporal lobe and posterior left frontoparietal region as well. Suspected early involvement of the left caudate and lentiform nuclei as well. Scattered areas of serpiginous hyperdensity at the posterior left sylvian fissure most likely due to contrast staining from recent endovascular procedure. Superimposed small focus of gas present within this region (series 3, image 22). Adjacent hyperdensity may reflect calcification and/or postsurgical changes. Superimposed hemorrhage not entirely excluded. No significant mass effect at this time. No other acute large vessel territory infarct. No other evidence for acute intracranial hemorrhage. No mass lesion, midline shift or mass effect. No hydrocephalus. No extra-axial fluid collection. Vascular: Diffuse hyperdensity throughout the intracranial vascular sure, likely related two recent IV contrast. Skull: Scalp soft tissues and calvarium are unchanged, and within normal limits. Sinuses/Orbits: Globes and oval soft tissues within normal limits. Paranasal sinuses and mastoids are clear. Endotracheal tube partially visualized. Other: None. IMPRESSION: 1. Evolving acute left MCA territory infarct as above. Superimposed scattered serpiginous hyperdensity most likely reflects contrast staining from recent endovascular revascularization procedure. Superimposed hemorrhage not entirely excluded. Attention at follow-up recommended. 2. Otherwise stable appearance of the brain. Electronically Signed   By: Rise Mu M.D.   On: 09/26/2016 23:58   Ct Head Wo Contrast  Result Date: 09/26/2016 CLINICAL DATA:  Code stroke. Passed out earlier today. Unable to talk and move RIGHT side.  EXAM: CT HEAD WITHOUT CONTRAST TECHNIQUE: Contiguous axial images were obtained from the base of the skull through the vertex without intravenous contrast. COMPARISON:  None. FINDINGS: The patient was unable to remain motionless for the exam. Small  or subtle lesions could be overlooked. Brain: There is asymmetric hypoattenuation of the LEFT anterolateral temporal lobe and insula consistent with early developing infarction. No other areas of concern for stroke are seen. There is no hemorrhage, mass lesion, hydrocephalus, or extra-axial fluid. Normal for age cerebral volume. Mild hypoattenuation of white matter could represent small vessel disease. Vascular: Abnormal hyperattenuation of the LEFT ICA terminus and proximal LEFT M1 MCA representing emergent large vessel occlusion. Skull: Normal. Negative for fracture or focal lesion. Sinuses/Orbits: No acute finding. Other: None. ASPECTS [facility] Stroke Program Early CT Score) - Ganglionic level infarction (caudate, lentiform nuclei, internal capsule, insula, M1-M3 cortex): 5, loss of insula and M2 cortex. - Supraganglionic infarction (M4-M6 cortex): 3 Total score (0-10 with 10 being normal): 8 IMPRESSION: 1. Emergent large vessel occlusion affecting the LEFT ICA terminus and proximal LEFT MCA. 2. ASPECTS is 8, early hypoattenuation of the LEFT insula and LEFT anterolateral temporal cortex. These results were called by telephone at the time of interpretation on 09/26/2016 at 7:12 pm to Dr. Marily Memos , who verbally acknowledged these results. Electronically Signed   By: Elsie Stain M.D.   On: 09/26/2016 19:19   Ct Angio Neck W And/or Wo Contrast  Addendum Date: 09/26/2016   ADDENDUM REPORT: 09/26/2016 21:24 ADDENDUM: Acute findings discussed with and reconfirmed by Dr.Lindzen, Neurology on 09/26/2016 at 9:20 pm. Electronically Signed   By: Awilda Metro M.D.   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 Last seen normal at 18 30 hours. EXAM: CT ANGIOGRAPHY HEAD AND NECK TECHNIQUE: Multidetector CT imaging of the head and neck was performed using the standard protocol during bolus administration of intravenous contrast. Multiplanar CT image reconstructions and MIPs were obtained to evaluate the vascular anatomy. Carotid stenosis measurements (when applicable) are obtained utilizing NASCET criteria, using the distal internal carotid diameter as the denominator. CONTRAST:  100 cc Isovue 370 COMPARISON:  CT HEAD Sep 26, 2016 1853 hours FINDINGS: CTA NECK AORTIC ARCH: Normal appearance of the thoracic arch, 2 vessel arch is a normal variant. Mild calcific atherosclerosis of the aortic arch. The origins of the innominate, left Common carotid artery and subclavian artery are widely patent. RIGHT CAROTID SYSTEM: Common carotid artery is widely patent. Normal appearance of the carotid bifurcation without hemodynamically significant stenosis by NASCET criteria, moderate calcific atherosclerosis. Normal appearance of the included internal carotid artery. LEFT CAROTID SYSTEM: Common carotid artery is widely patent. Normal appearance of the carotid bifurcation without hemodynamically significant stenosis by NASCET criteria, moderate calcific atherosclerosis. Central thrombus and occluded LEFT internal carotid artery within 17 mm of the origin. VERTEBRAL ARTERIES:Left vertebral artery is dominant. Patent vertebral artery's with extrinsic compression due to degenerative cervical spine. SKELETON: No acute osseous process though bone windows have not been submitted. Status post median sternotomy. OTHER NECK: Soft tissues of the neck are non-acute though, not tailored for evaluation. Severe mid cervical degenerative change resulting and severe bilateral C3-4, LEFT C4-5, bilateral C5-6 and moderate to severe C6-7 neural foraminal narrowing. CTA HEAD ANTERIOR CIRCULATION: LEFT internal carotid artery is occluded to the  level of the carotid terminus, with focal central thrombus. Patent anterior communicating artery with diminutive anterior cerebral artery, LEFT A1 segment is patent. Occluded LEFT M1 origin with mid to distal LEFT M1 thready collateralization and intermittent occlusion. No contrast extravasation or aneurysm. POSTERIOR CIRCULATION: Patent vertebral arteries, vertebrobasilar junction and basilar artery, as well as main branch vessels. Patent  posterior cerebral arteries. Robust RIGHT posterior communicating artery present. No large vessel occlusion, hemodynamically significant stenosis, dissection, luminal irregularity, contrast extravasation or aneurysm. VENOUS SINUSES: Major dural venous sinuses are patent though not tailored for evaluation on this angiographic examination. ANATOMIC VARIANTS: Fetal origin RIGHT posterior cerebral artery. DELAYED PHASE: Faint posterior insular enhancement. MIP images reviewed. IMPRESSION: CTA NECK:  Acute LEFT internal carotid artery occlusion. Atherosclerosis without hemodynamically significant stenosis. Multilevel severe neural foraminal narrowing. CTA HEAD: Occluded LEFT ICA with reconstitution LEFT carotid terminus. Occluded proximal LEFT M1 segment with thready collateralization. Diminutive RIGHT anterior cerebral artery favoring normal variant. Critical Value/emergent results were called by telephone at the time of interpretation on 09/26/2016 at 8:42 pm to Dr. Marily Memos , who verbally acknowledged these results. Dr. Otelia Limes, Neurology paged via [facility] secure hospital paging system on 09/26/2016 at 8:45 pm, awaiting return call. Electronically Signed: By: Awilda Metro M.D. On: 09/26/2016 20:52   Mr Brain Wo Contrast  Result Date: 09/27/2016 CLINICAL DATA:  Follow-up LEFT internal carotid artery occlusion and LEFT MCA stroke. Status post endovascular revascularization LEFT internal carotid artery, LEFT MCA and LEFT ACA. EXAM: MRI HEAD WITHOUT CONTRAST MRA HEAD WITHOUT  CONTRAST TECHNIQUE: Multiplanar, multiecho pulse sequences of the brain and surrounding structures were obtained without intravenous contrast. Angiographic images of the head were obtained using MRA technique without contrast. COMPARISON:  CT and CTA  HEAD Sep 26, 2016. FINDINGS: MRI HEAD FINDINGS BRAIN: Patchy to confluent reduced diffusion LEFT frontoparietal and temporal lobes, to lesser extent LEFT basal ganglia with low ADC values. Punctate areas of susceptibility artifact LEFT frontal lobe, LEFT insula, some which correspond to density on prior CT. No lobar hematoma. Regional LEFT cerebrum mass effect without significant midline shift. No hydrocephalus. A few scattered supratentorial subcentimeter white matter FLAIR T2 hyperintensities exclusive a aforementioned abnormality compatible with mild chronic small vessel ischemic disease. VASCULAR: Normal major intracranial vascular flow voids present at skull base. SKULL AND UPPER CERVICAL SPINE: No abnormal sellar expansion. No suspicious calvarial bone marrow signal. Craniocervical junction maintained. SINUSES/ORBITS: The mastoid air-cells and included paranasal sinuses are well-aerated. The included ocular globes and orbital contents are non-suspicious. OTHER: None. MRA HEAD FINDINGS ANTERIOR CIRCULATION: Flow related enhancement of the included cervical, petrous, cavernous and supraclinoid internal carotid arteries. Pulsation artifact limits assessment of the carotid terminus bilaterally. Patent anterior communicating artery. Developmentally smaller RIGHT anterior cerebral artery. Severe stenosis LEFT M2 versus M3 origin given early bifurcation. No large vessel occlusion, abnormal luminal irregularity, aneurysm. POSTERIOR CIRCULATION: LEFT vertebral artery is dominant. Basilar artery is patent, with normal flow related enhancement of the main branch vessels. Normal flow related enhancement of the posterior cerebral arteries. Small LEFT and robust RIGHT  posterior communicating artery is present. No large vessel occlusion, high-grade stenosis, abnormal luminal irregularity, aneurysm. ANATOMIC VARIANTS: None. Source images and MIP images were reviewed. IMPRESSION: MRI HEAD: Acute large LEFT MCA territory infarct with petechial hemorrhage. Mild chronic small vessel ischemic disease. MRA HEAD: Successful re- vascularization of LEFT internal carotid artery. Severe stenosis LEFT M2 vs. M3 origin, possible residual thromboembolism. Electronically Signed   By: Awilda Metro M.D.   On: 09/27/2016 20:14   Dg Chest Port 1 View  Result Date: 09/27/2016 CLINICAL DATA:  Endotracheal tube placement.  Initial encounter. EXAM: PORTABLE CHEST 1 VIEW COMPARISON:  None. FINDINGS: The patient's endotracheal tube is seen ending 3-4 cm above the carina. The lungs are hypoexpanded. Vascular congestion is noted. Left basilar airspace opacity may reflect pneumonia. No pleural effusion or  pneumothorax is seen. The cardiomediastinal silhouette is mildly enlarged. The patient is status post median sternotomy. There is a chronic fracture of the superior most sternal wire. No acute osseous abnormalities are identified. IMPRESSION: 1. Endotracheal tube seen ending 3-4 cm above the carina. 2. Lungs hypoexpanded. Vascular congestion and mild cardiomegaly. Left basilar airspace opacity may reflect pneumonia. Electronically Signed   By: Roanna Raider M.D.   On: 09/27/2016 00:43   Dg Abd Portable 1v  Result Date: 09/27/2016 CLINICAL DATA:  Orogastric tube placement.  Initial encounter. EXAM: PORTABLE ABDOMEN - 1 VIEW COMPARISON:  None. FINDINGS: The patient's enteric tube is noted ending overlying the body of the stomach, with the side port about the fundus of the stomach. The visualized bowel gas pattern is grossly unremarkable. Contrast is seen within the renal calyces. No acute osseous abnormalities are identified. Left basilar airspace opacity may reflect pneumonia. IMPRESSION: 1.  Enteric tube noted ending overlying the body of stomach, with the side port about the fundus of the stomach. 2. Left basilar airspace opacity may reflect pneumonia. Electronically Signed   By: Roanna Raider M.D.   On: 09/27/2016 00:44   Mr Maxine Glenn Head/brain ZO Cm  Result Date: 09/27/2016 CLINICAL DATA:  Follow-up LEFT internal carotid artery occlusion and LEFT MCA stroke. Status post endovascular revascularization LEFT internal carotid artery, LEFT MCA and LEFT ACA. EXAM: MRI HEAD WITHOUT CONTRAST MRA HEAD WITHOUT CONTRAST TECHNIQUE: Multiplanar, multiecho pulse sequences of the brain and surrounding structures were obtained without intravenous contrast. Angiographic images of the head were obtained using MRA technique without contrast. COMPARISON:  CT and CTA  HEAD Sep 26, 2016. FINDINGS: MRI HEAD FINDINGS BRAIN: Patchy to confluent reduced diffusion LEFT frontoparietal and temporal lobes, to lesser extent LEFT basal ganglia with low ADC values. Punctate areas of susceptibility artifact LEFT frontal lobe, LEFT insula, some which correspond to density on prior CT. No lobar hematoma. Regional LEFT cerebrum mass effect without significant midline shift. No hydrocephalus. A few scattered supratentorial subcentimeter white matter FLAIR T2 hyperintensities exclusive a aforementioned abnormality compatible with mild chronic small vessel ischemic disease. VASCULAR: Normal major intracranial vascular flow voids present at skull base. SKULL AND UPPER CERVICAL SPINE: No abnormal sellar expansion. No suspicious calvarial bone marrow signal. Craniocervical junction maintained. SINUSES/ORBITS: The mastoid air-cells and included paranasal sinuses are well-aerated. The included ocular globes and orbital contents are non-suspicious. OTHER: None. MRA HEAD FINDINGS ANTERIOR CIRCULATION: Flow related enhancement of the included cervical, petrous, cavernous and supraclinoid internal carotid arteries. Pulsation artifact limits  assessment of the carotid terminus bilaterally. Patent anterior communicating artery. Developmentally smaller RIGHT anterior cerebral artery. Severe stenosis LEFT M2 versus M3 origin given early bifurcation. No large vessel occlusion, abnormal luminal irregularity, aneurysm. POSTERIOR CIRCULATION: LEFT vertebral artery is dominant. Basilar artery is patent, with normal flow related enhancement of the main branch vessels. Normal flow related enhancement of the posterior cerebral arteries. Small LEFT and robust RIGHT posterior communicating artery is present. No large vessel occlusion, high-grade stenosis, abnormal luminal irregularity, aneurysm. ANATOMIC VARIANTS: None. Source images and MIP images were reviewed. IMPRESSION: MRI HEAD: Acute large LEFT MCA territory infarct with petechial hemorrhage. Mild chronic small vessel ischemic disease. MRA HEAD: Successful re- vascularization of LEFT internal carotid artery. Severe stenosis LEFT M2 vs. M3 origin, possible residual thromboembolism. Electronically Signed   By: Awilda Metro M.D.   On: 09/27/2016 20:14   Ir Percutaneous Art Thrombectomy/infusion Intracranial Inc Diag Angio  Result Date: 09/28/2016 INDICATION: Acute onset aphasia and right-sided weakness.  Abnormal CT angiogram of the head and neck with occluded left internal carotid artery terminus, the left middle cerebral artery and left anterior cerebral artery. Status post IV tPA. EXAM: 1. EMERGENT LARGE VESSEL OCCLUSION THROMBOLYSIS (anterior CIRCULATION) 2. COMPARISON:  CT angiogram of 09/26/2016. MEDICATIONS: Ancef 2 g IV. Antibiotic was administered within 1 hour of the procedure. ANESTHESIA/SEDATION: General anesthesia. CONTRAST:  Isovue 300 approximately 90 cc. FLUOROSCOPY TIME:  Fluoroscopy Time: 23 minutes 54 seconds (2023 mGy). COMPLICATIONS: None immediate. TECHNIQUE: Following a full explanation of the procedure along with the potential associated complications, an informed witnessed consent  was obtained. The risks of intracranial hemorrhage of 10%, worsening neurological deficit, ventilator dependency, death and inability to revascularize were all reviewed in detail with the patient's family. The patient was then put under general anesthesia by the Department of Anesthesiology at [facility]. The right groin was prepped and draped in the usual sterile fashion. Thereafter using modified Seldinger technique, transfemoral access into the right common femoral artery was obtained without difficulty. Over a 0.035 inch guidewire a 5 French Pinnacle sheath was inserted. Through this, and also over a 0.035 inch guidewire a 5 [place] JB 1 catheter was advanced to the aortic arch region and selectively positioned in the left common carotid artery. Arteriograms were then performed centered extra cranially and intracranially. FINDINGS: The left common carotid arteriogram demonstrates the left external carotid artery and its major branches to be widely patent. The left internal carotid at the bulb is widely patent. There is slow ascent of contrast in the left internal carotid artery to the cranial skull base to the level of the anterior choroidal artery. A large filling defect is seen within the vessel. Distal to this there is no visualization of the supraclinoid left ICA, or the left middle cerebral artery or the left anterior cerebral arteries. The ophthalmic artery opacifies normally with a ciliary blush. PROCEDURE: The diagnostic JB 1 catheter in the left common carotid artery was exchanged over a 0.035 inch 300 cm Rosen exchange guidewire for an 8 French 55 cm Brite tip neurovascular sheath using biplane roadmap technique and constant fluoroscopic guidance. Good aspiration was obtained from the side port at the hub of the 8 [place] of neurovascular sheath. This was then connected to continuous heparinized saline infusion. Over the [employer] guidewire, an 8 [place] 85 cm FlowGate balloon guide catheter  which had been prepped with 50% contrast and 50% heparinized saline infusion was advanced and positioned in the left common carotid artery. The guidewire was removed. Good aspiration was obtained from the hub of the [facility] guide catheter. A gentle contrast injection demonstrated no evidence of spasms, dissections or of intraluminal filling defects. Over a 0.035 inch Roadrunner guidewire, using biplane technique and constant fluoroscopic guidance, the 8 [place] FlowGate guide catheter was advanced to the distal cervical left ICA at the cervical petrous junction. The guidewire was removed. Good aspiration was obtained from the hub of the 8 [place] FlowGate guide catheter. A gentle contrast injection demonstrated no evidence of spasms, dissections or of intraluminal filling defects. No change was seen in the intracranial circulation. At this time, in a coaxial manner and with constant heparinized saline infusion using biplane roadmap technique and constant fluoroscopic guidance, a Trevo ProVue microcatheter was advanced over a 0.014 inch Softip Synchro micro guidewire to the distal end of the FlowGate catheter. With the micro leading with a J-tip configuration to avoid dissections or inducing spasm, the combination was navigated to just proximal to the  anterior choroidal artery. Thereafter, using a torque device, with gentle manipulation, access was obtained into the left middle cerebral artery with the micro guidewire which was then advanced into the M2 M3 region of the inferior division of the left middle cerebral artery followed by the microcatheter. The guidewire was removed. Good aspiration was obtained from the hub of the microcatheter. A gentle contrast injection demonstrated slow antegrade flow at the distal end of the micro catheter. At this time, a 4 mm x 40 mm Solitaire FR retrieval device was advanced in a coaxial manner and with constant heparinized saline infusion using biplane roadmap technique and  constant fluoroscopic guidance to the distal end of the microcatheter. The O ring on the delivery microcatheter was then loosened. The entire system was straightened and the proximal landing zone of the retrieval device was just proximal to the occluded left internal carotid artery. With slight forward traction with the right hand on the delivery micro guidewire, with the left hand the delivery microcatheter was retrieved and positioned just proximal to the proximal portion of the retrieval device. A control arteriogram performed through the 8 [place] FlowGate guide catheter demonstrated recanalization of the previously occluded left internal carotid artery terminus, and also the left middle cerebral artery proximally. A TICI 2a reperfusion was obtained. The proximal portion of the retrieval device was then captured into the microcatheter. The balloon was inflated in the left internal carotid artery FlowGate guide catheter for proximal flow arrest. Thereafter, with constant fluoroscopic guidance, the combination of the retrieval device and the microcatheter were gently retrieved as aspiration was performed at the side port of the hub of the 8 [place] FlowGate guide catheter with a 60 mL syringe. This was continued after the combination of the retrieval device and the micro catheter had been removed. An aspiration was continued as the balloon was deflated in the distal left internal carotid artery. The aspirate contained large chunks of clots. A control arteriogram performed through the 8 [place] FlowGate guide catheter in the left internal carotid artery demonstrated complete angiographic revascularization of the left internal carotid artery supraclinoid segment, the left anterior cerebral artery and the left middle cerebral artery superior and inferior divisions. The inferior division in the M2 M3 region demonstrated continued occlusion secondary to a clot. This prompted the advancement of the Trevo ProVue  microcatheter over a 0.014 inch Synchro softip guidewire into the proximal left middle cerebral artery. The micro guidewire was then gently manipulated using a torque device passed the occluded distal M2 M3 region of the inferior division. The micro guidewire was removed. Good aspiration was obtained from the hub of the microcatheter. A gentle contrast injection demonstrated positioning of tip the microcatheter. Slow antegrade clearance of contrast was noted. At this time a FR 4 mm x 40 mm retrieval device was again advanced and positioned in the distal microcatheter. Again the proximal and the distal markers of the retrieval device were positioned. The retrieval was then deployed by retrieving the delivery microcatheter and holding the delivery micro guidewire. A control arteriogram performed through the 8 [place] FlowGate guide catheter demonstrated complete angiographic opacification of the previously occluded inferior division and also the remainder of the MCA distribution. The balloon on the [facility] guide catheter was expanded with contrast for proximal flow arrest. Again as mentioned previously the proximal portion was the retrieval device was captured into the microcatheter. With constant aspiration being applied at the side port at the hub of the [facility] guide catheter with a 60 mL  syringe, the combination of the retrieval device and the microcatheter were retrieved and removed. At this time the aspirate contained a 2 mm clot. Aspiration was continued as deflation of the balloon was performed. A control arteriogram performed through the 8 [place] FlowGate catheter now demonstrated complete angiographic opacification of the left middle cerebral artery territory. Moderate spasm was noted in the proximal superior division of the left middle cerebral artery. This responded to 3 aliquots of 25 mics of nitroglycerin intra-arterially for over a period of approximately 10 minutes. A final control arteriogram  performed through the 8 [place] FlowGate catheter demonstrated near complete resolution of the previously noted spasm with improved flow in the left MCA distribution. The left anterior cerebral artery, the anterior choroidal artery and the artery of [place] remained patent. No angiographic extravasation of contrast or mass-effect on the major vessel was seen. The patient's hemodynamic status and neurological status remained stable throughout the procedure. The 8 [place] FlowGate guide catheter and the neurovascular sheath were retrieved into the abdominal aorta and exchanged over a J-tip guidewire for a 9 [place] Pinnacle sheath. This was then connected to continuous heparinized saline infusion. No evidence of a hematoma was noted at the groin puncture site. The distal pulses in both feet remained palpable at the end of the procedure. These were unchanged. The patient then transferred to the Neuro ICU for further management. IMPRESSION: Status post endovascular complete revascularization of occluded left internal carotid artery terminus, the left middle cerebral artery, and left anterior cerebral artery with a TA occlusion, with 2 passes of Solitaire FR 4 mm x 40 mm retrieval device achieving a TICI 3 reperfusion in the left middle cerebral artery distribution. Time from the groin puncture to TICI 2b reperfusion 26 minutes. Time from groin puncture to TICI 3 reperfusion 44 minutes. PLAN: Patient taken to CT scan for a postprocedural CT scan of the brain. Electronically Signed   By: Julieanne Cotton M.D.   On: 09/27/2016 10:54    Labs:  CBC:  Recent Labs  04/20/16 1104 09/26/16 1851 09/26/16 1910 09/27/16 0210 09/28/16 0500  WBC 9.4 11.4*  --  12.0* 15.5*  HGB 13.1 13.0 13.6 10.8* 10.0*  HCT 39.8 39.3 40.0 34.2* 33.0*  PLT 391 324  --  310 324    COAGS:  Recent Labs  09/26/16 1851  INR 0.98  APTT 28    BMP:  Recent Labs  04/20/16 1104 09/26/16 1851 09/26/16 1910 09/27/16 0210  09/28/16 0500  NA 139 142 143 141 140  K 4.7 3.6 3.6 4.1 4.7  CL 106 107 107 109 109  CO2 26 23  --  22 25  GLUCOSE 100* 132* 131* 171* 149*  BUN 18 18 18 15 17   CALCIUM 9.1 9.7  --  8.3* 8.2*  CREATININE 0.75 1.15 1.20 0.87 0.79  GFRNONAA >60 >60  --  >60 >60  GFRAA >60 >60  --  >60 >60    LIVER FUNCTION TESTS:  Recent Labs  09/26/16 1851  BILITOT 0.5  AST 34  ALT 28  ALKPHOS 95  PROT 8.0  ALBUMIN 4.5    Assessment and Plan: 1. CVA, s/p L ICA; MCA; ACA revascularization 5/21  Patient still intubated, but starting to wean. He is able to follow commands to move his extremities.  Still unable to do fine motor movements to command per RN. Plans per primary service.  Electronically Signed: Letha Cape 09/28/2016, 9:55 AM   I spent a total of 15 Minutes at  the the patient's bedside AND on the patient's hospital floor or unit, greater than 50% of which was counseling/coordinating care for CVA, s/p revascularization

## 2016-09-28 NOTE — Progress Notes (Signed)
MRI head shows large left MCA ischemic infarction with petechial hemorrhagic conversion. Would await results of CT ordered for 11 AM on 5/23 to determine if ASA should be started - if blood is stable, can safely start ASA.  Electronically signed: Dr. Caryl PinaEric Rhea Thrun

## 2016-09-28 NOTE — Progress Notes (Signed)
OT Cancellation Note  Patient Details Name: Read Drivershillip Ranney MRN: [id number] DOB: 03/16/1956   Cancelled Treatment:    Reason Eval/Treat Not Completed: Patient not medically ready  [facility],HILLARY  Dawnna Gritz, OT/L  [phone] 09/28/2016 09/28/2016, 7:37 AM

## 2016-09-28 NOTE — Procedures (Signed)
Extubation Procedure Note  Patient Details:   Name: Johnny Sullivan DOB: 08/04/1955 MRN: [id number]   Airway Documentation:     Evaluation  O2 sats: stable throughout Complications: No apparent complications Patient did tolerate procedure well. Bilateral Breath Sounds: Clear   Yes.  Leak test preformed Extubated per dr.'s orders  Placed on 3L  [name] V 09/28/2016, 11:07 AM

## 2016-09-28 NOTE — Evaluation (Signed)
Physical Therapy Evaluation Patient Details Name: Johnny Sullivan MRN: [id number] DOB: 03/06/1956 Today's Date: 09/28/2016   History of Present Illness  Pt is a 61 yo male admitted to to the hospital with complaints of stroke symptoms including inability to talk and R sided weakness. CTA revealed left ICA occlusion and a short occluded segment of the proximal left MCA. [facility]   Clinical Impression  Pt presenting with good functional mobility requiring supervision and assistance with lines management during gait without AD. Pt limited by aphasia, but is able to follow commands with gestures. No follow up PT recommended upon d/c due to patient demonstrating good mobility and strong family support. PT will follow acutely to address decreased activity tolerance.     Follow Up Recommendations No PT follow up;Supervision - Intermittent    Equipment Recommendations  None recommended by PT    Recommendations for Other Services       Precautions / Restrictions Precautions Precautions: Fall Restrictions Weight Bearing Restrictions: No      Mobility  Bed Mobility Overal bed mobility: Needs Assistance Bed Mobility: Supine to Sit     Supine to sit: Min Senon Nixon     General bed mobility comments: PT provided lines management during bed mobility  Transfers Overall transfer level: Needs assistance Equipment used: None Transfers: Sit to/from Stand Sit to Stand: Supervision            Ambulation/Gait Ambulation/Gait assistance: Supervision Ambulation Distance (Feet): 180 Feet Assistive device: None Gait Pattern/deviations: Step-through pattern;Decreased stride length Gait velocity: decreased Gait velocity interpretation: Below normal speed for age/gender General Gait Details: Pt required verbal and visual cues to increase stride length initially with pt able to maintain. Pt's son reports gait was similar to  baseline.  Stairs            Wheelchair Mobility    Modified Rankin (Stroke Patients Only) Modified Rankin (Stroke Patients Only) Pre-Morbid Rankin Score: No symptoms Modified Rankin: Moderately severe disability     Balance Overall balance assessment: Needs assistance Sitting-balance support: No upper extremity supported;Feet supported Sitting balance-Leahy Scale: Good     Standing balance support: No upper extremity supported;During functional activity Standing balance-Leahy Scale: Fair                               Pertinent Vitals/Pain Pain Assessment: Faces Faces Pain Scale: No hurt    Home Living Family/patient expects to be discharged to:: Private residence Living Arrangements: Spouse/significant other Available Help at Discharge: Family Type of Home: House Home Access: Stairs to enter Entrance Stairs-Rails: None Entrance Stairs-Number of Steps: 1 Home Layout: One level Home Equipment: Environmental consultantWalker - 2 wheels;Wheelchair - manual      Prior Function Level of Independence: Independent               Hand Dominance   Dominant Hand: Right    Extremity/Trunk Assessment   Upper Extremity Assessment Upper Extremity Assessment: Overall WFL for tasks assessed    Lower Extremity Assessment Lower Extremity Assessment: Generalized weakness;RLE deficits/detail;LLE deficits/detail RLE Deficits / Details: 3+/5 strength grossly  LLE Deficits / Details: 4/5 strength grossly        Communication   Communication: Expressive difficulties  Cognition Arousal/Alertness: Awake/alert Behavior During Therapy: WFL for tasks assessed/performed Overall Cognitive Status: Difficult to assess (aphasia)  General Comments: Pt able to follow commands with gestures, but demonstrates difficulty with expressive language      General Comments General comments (skin integrity, edema, etc.): Vitals remained stable  throughout session    Exercises     Assessment/Plan    PT Assessment Patient needs continued PT services  PT Problem List Decreased strength;Decreased activity tolerance;Decreased balance;Decreased mobility;Decreased safety awareness       PT Treatment Interventions Gait training;Stair training;Functional mobility training;Therapeutic activities;Therapeutic exercise;Balance training;Neuromuscular re-education;Patient/family education    PT Goals (Current goals can be found in the Care Plan section)  Acute Rehab PT Goals Patient Stated Goal: unable to state PT Goal Formulation: Patient unable to participate in goal setting Time For Goal Achievement: 10/12/16 Potential to Achieve Goals: Good    Frequency Min 4X/week   Barriers to discharge        Co-evaluation               AM-PAC PT "6 Clicks" Daily Activity  Outcome Measure Difficulty turning over in bed (including adjusting bedclothes, sheets and blankets)?: A Little Difficulty moving from lying on back to sitting on the side of the bed? : A Little Difficulty sitting down on and standing up from a chair with arms (e.g., wheelchair, bedside commode, etc,.)?: A Little Help needed moving to and from a bed to chair (including a wheelchair)?: A Little Help needed walking in hospital room?: A Little Help needed climbing 3-5 steps with a railing? : A Little 6 Click Score: 18    End of Session Equipment Utilized During Treatment: Gait belt Activity Tolerance: Patient tolerated treatment well Patient left: in chair;with call bell/phone within reach;with chair alarm set;with family/visitor present Nurse Communication: Mobility status PT Visit Diagnosis: Unsteadiness on feet (R26.81);Difficulty in walking, not elsewhere classified (R26.2);Hemiplegia and hemiparesis Hemiplegia - Right/Left: Right Hemiplegia - dominant/non-dominant: Dominant Hemiplegia - caused by: Cerebral infarction    Time: 1524-1540 PT Time  Calculation (min) (ACUTE ONLY): 16 min   Charges:   PT Evaluation $PT Eval Moderate Complexity: 1 Procedure     PT G CodesCorlis Leak, SPT  [phone]  Corlis Leak 09/28/2016, 4:28 PM

## 2016-09-28 NOTE — Progress Notes (Signed)
PT Cancellation Note  Patient Details Name: Johnny Sullivan MRN: [id number] DOB: 08/08/1955   Cancelled Treatment:    Reason Eval/Treat Not Completed: Patient not medically ready, remains intubated at this time   Fabio AsaDevon J Caylor Cerino 09/28/2016, 10:43 AM

## 2016-09-28 NOTE — Consult Note (Signed)
PULMONARY / CRITICAL CARE MEDICINE   Name: Johnny Sullivan MRN: [id number] DOB: 02/12/1956    ADMISSION DATE:  09/26/2016 CONSULTATION DATE:  09/26/2016  REFERRING MD:  Dr. Otelia LimesLindzen   CHIEF COMPLAINT:  CVA  HISTORY OF PRESENT ILLNESS:   61 year old male with PMH of CAD s/p CABG 2014, GERD, HTN, MI, and urethral stricture, recent admission in December 2017 for right total hip replacement.   [facility]resents to [facility] on [facility] as a Code Stroke. 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 CT revealed Left ICA and Prox Left MCA occlusions. Patient arrived to [facility] at 2048 with noted swelling to right side of his tongue > given solumedrol, benadryl, and pepcid. Patient was intubated and take to IR for left common carotid arteriogram followed by endovascular revascularization. PCCM asked to consult for medical/vent management.    SUBJECTIVE:  Follows some commands Remains on vent  VITAL SIGNS: BP (!) 118/58   Pulse 68   Temp 98.5 F (36.9 C) (Axillary)   Resp 13   Ht 5\' 9"  (1.753 m)   Wt 107.9 kg (237 lb 14 oz)   SpO2 100%   BMI 35.13 kg/m   HEMODYNAMICS:    VENTILATOR SETTINGS: Vent Mode: PRVC FiO2 (%):  [40 %] 40 % Set Rate:  [16 bmp] 16 bmp Vt Set:  [570 mL] 570 mL PEEP:  [5 cmH20] 5 cmH20 Plateau Pressure:  [14 cmH20-18 cmH20] 15 cmH20  INTAKE / OUTPUT: I/O last 3 completed shifts: In: 3915.2 [I.V.:3815.2; IV Piggyback:100] Out: 2350 [Urine:2000; Emesis/NG output:275; Blood:75]  PHYSICAL EXAMINATION: General: calm on vent Neuro: rass-1, fc, weak rt arm, perrl HEENT: jvd wnl, no bruit PULM: CTA, tonbue airway wnl CV:  s1 s2 RRR no r GI: soft, BS wnl no r Extremities: edema none   LABS:  BMET  Recent Labs Lab 09/26/16 1851 09/26/16 1910 09/27/16 0210 09/28/16 0500  NA 142 143 141 140  K 3.6 3.6 4.1 4.7  CL 107 107  109 109  CO2 23  --  22 25  BUN 18 18 15 17   CREATININE 1.15 1.20 0.87 0.79  GLUCOSE 132* 131* 171* 149*    Electrolytes  Recent Labs Lab 09/26/16 1851 09/27/16 0210 09/28/16 0500  CALCIUM 9.7 8.3* 8.2*  MG  --  2.2  --   PHOS  --  4.3  --     CBC  Recent Labs Lab 09/26/16 1851 09/26/16 1910 09/27/16 0210 09/28/16 0500  WBC 11.4*  --  12.0* 15.5*  HGB 13.0 13.6 10.8* 10.0*  HCT 39.3 40.0 34.2* 33.0*  PLT 324  --  310 324    Coag's  Recent Labs Lab 09/26/16 1851  APTT 28  INR 0.98    Sepsis Markers No results for input(s): LATICACIDVEN, PROCALCITON, O2SATVEN in the last 168 hours.  ABG  Recent Labs Lab 09/27/16 0035  PHART 7.290*  PCO2ART 46.7  PO2ART 103.0    Liver Enzymes  Recent Labs Lab 09/26/16 1851  AST 34  ALT 28  ALKPHOS 95  BILITOT 0.5  ALBUMIN 4.5    Cardiac Enzymes No results for input(s): TROPONINI, PROBNP in the last 168 hours.  Glucose No results for input(s): GLUCAP in the last 168 hours.  Imaging Mr Brain Wo Contrast  Result Date: 09/27/2016 CLINICAL DATA:  Follow-up LEFT internal carotid  artery occlusion and LEFT MCA stroke. Status post endovascular revascularization LEFT internal carotid artery, LEFT MCA and LEFT ACA. EXAM: MRI HEAD WITHOUT CONTRAST MRA HEAD WITHOUT CONTRAST TECHNIQUE: Multiplanar, multiecho pulse sequences of the brain and surrounding structures were obtained without intravenous contrast. Angiographic images of the head were obtained using MRA technique without contrast. COMPARISON:  CT and CTA  HEAD Sep 26, 2016. FINDINGS: MRI HEAD FINDINGS BRAIN: Patchy to confluent reduced diffusion LEFT frontoparietal and temporal lobes, to lesser extent LEFT basal ganglia with low ADC values. Punctate areas of susceptibility artifact LEFT frontal lobe, LEFT insula, some which correspond to density on prior CT. No lobar hematoma. Regional LEFT cerebrum mass effect without significant midline shift. No hydrocephalus. A  few scattered supratentorial subcentimeter white matter FLAIR T2 hyperintensities exclusive a aforementioned abnormality compatible with mild chronic small vessel ischemic disease. VASCULAR: Normal major intracranial vascular flow voids present at skull base. SKULL AND UPPER CERVICAL SPINE: No abnormal sellar expansion. No suspicious calvarial bone marrow signal. Craniocervical junction maintained. SINUSES/ORBITS: The mastoid air-cells and included paranasal sinuses are well-aerated. The included ocular globes and orbital contents are non-suspicious. OTHER: None. MRA HEAD FINDINGS ANTERIOR CIRCULATION: Flow related enhancement of the included cervical, petrous, cavernous and supraclinoid internal carotid arteries. Pulsation artifact limits assessment of the carotid terminus bilaterally. Patent anterior communicating artery. Developmentally smaller RIGHT anterior cerebral artery. Severe stenosis LEFT M2 versus M3 origin given early bifurcation. No large vessel occlusion, abnormal luminal irregularity, aneurysm. POSTERIOR CIRCULATION: LEFT vertebral artery is dominant. Basilar artery is patent, with normal flow related enhancement of the main branch vessels. Normal flow related enhancement of the posterior cerebral arteries. Small LEFT and robust RIGHT posterior communicating artery is present. No large vessel occlusion, high-grade stenosis, abnormal luminal irregularity, aneurysm. ANATOMIC VARIANTS: None. Source images and MIP images were reviewed. IMPRESSION: MRI HEAD: Acute large LEFT MCA territory infarct with petechial hemorrhage. Mild chronic small vessel ischemic disease. MRA HEAD: Successful re- vascularization of LEFT internal carotid artery. Severe stenosis LEFT M2 vs. M3 origin, possible residual thromboembolism. Electronically Signed   By: Awilda Metro M.D.   On: 09/27/2016 20:14   Mr Maxine Glenn Head/brain ZO Cm  Result Date: 09/27/2016 CLINICAL DATA:  Follow-up LEFT internal carotid artery occlusion and  LEFT MCA stroke. Status post endovascular revascularization LEFT internal carotid artery, LEFT MCA and LEFT ACA. EXAM: MRI HEAD WITHOUT CONTRAST MRA HEAD WITHOUT CONTRAST TECHNIQUE: Multiplanar, multiecho pulse sequences of the brain and surrounding structures were obtained without intravenous contrast. Angiographic images of the head were obtained using MRA technique without contrast. COMPARISON:  CT and CTA  HEAD Sep 26, 2016. FINDINGS: MRI HEAD FINDINGS BRAIN: Patchy to confluent reduced diffusion LEFT frontoparietal and temporal lobes, to lesser extent LEFT basal ganglia with low ADC values. Punctate areas of susceptibility artifact LEFT frontal lobe, LEFT insula, some which correspond to density on prior CT. No lobar hematoma. Regional LEFT cerebrum mass effect without significant midline shift. No hydrocephalus. A few scattered supratentorial subcentimeter white matter FLAIR T2 hyperintensities exclusive a aforementioned abnormality compatible with mild chronic small vessel ischemic disease. VASCULAR: Normal major intracranial vascular flow voids present at skull base. SKULL AND UPPER CERVICAL SPINE: No abnormal sellar expansion. No suspicious calvarial bone marrow signal. Craniocervical junction maintained. SINUSES/ORBITS: The mastoid air-cells and included paranasal sinuses are well-aerated. The included ocular globes and orbital contents are non-suspicious. OTHER: None. MRA HEAD FINDINGS ANTERIOR CIRCULATION: Flow related enhancement of the included cervical, petrous, cavernous and supraclinoid internal carotid arteries. Pulsation artifact limits  assessment of the carotid terminus bilaterally. Patent anterior communicating artery. Developmentally smaller RIGHT anterior cerebral artery. Severe stenosis LEFT M2 versus M3 origin given early bifurcation. No large vessel occlusion, abnormal luminal irregularity, aneurysm. POSTERIOR CIRCULATION: LEFT vertebral artery is dominant. Basilar artery is patent, with  normal flow related enhancement of the main branch vessels. Normal flow related enhancement of the posterior cerebral arteries. Small LEFT and robust RIGHT posterior communicating artery is present. No large vessel occlusion, high-grade stenosis, abnormal luminal irregularity, aneurysm. ANATOMIC VARIANTS: None. Source images and MIP images were reviewed. IMPRESSION: MRI HEAD: Acute large LEFT MCA territory infarct with petechial hemorrhage. Mild chronic small vessel ischemic disease. MRA HEAD: Successful re- vascularization of LEFT internal carotid artery. Severe stenosis LEFT M2 vs. M3 origin, possible residual thromboembolism. Electronically Signed   By: Awilda Metro M.D.   On: 09/27/2016 20:14     STUDIES:  CT Head 5/21 > Emergent large vessel occlusion affecting the LEFT ICA terminus and proximal LEFT MCA. ASPECTS is 8, early hypoattenuation of the LEFT insula and LEFT anterolateral temporal cortex CTA Head/Neck 5/21 > Acute LEFT internal carotid artery occlusion,  Atherosclerosis without hemodynamically significant stenosis. Multilevel severe neural foraminal narrowing. Occluded LEFT ICA with reconstitution LEFT carotid terminus. Occluded proximal LEFT M1 segment with thready collateralization. Diminutive RIGHT anterior cerebral artery favoring normal variant. MRI 5/22>>MRI HEAD: Acute large LEFT MCA territory infarct with petechial hemorrhage. Mild chronic small vessel ischemic disease  CULTURES: None.  ANTIBIOTICS: None.  SIGNIFICANT EVENTS: 5/21 > Presents as Code Stroke > Taken to IR for revascularization   LINES/TUBES: ETT 5/21 >>  DISCUSSION: 61 year old male with occluded left ICA and left M1 segment taken to IR for revascularization.   ASSESSMENT / PLAN:  PULMONARY A: Respiratory Insufficiency in setting of CVA Angioedema > improving s/p benadryl/solu-medrol/pepcid in ED  P:   Wean aggressive cpap 5 ps5, goal 1 hour , assess rsbi, assess cough ( strong) Leak  needed Secretions low to none Swelling resolved, dc Solu-medrol ( especially in setting cva), Benadryl, and Pepcid  If he remains intubated, would re assess rest abg    CARDIOVASCULAR A:  H/O HTN, CAD s/p CABG P:  Cardiac Monitoring  Off  Cleviprex to Maintain Systolic 120-140 per Neurology  Continue home Lipitor, hold home metoprolol  Echo reviewed Meeting map goals overall Consider dc aline  RENAL A:   H/O Urethral Stricture  P:   Trend BMP Avoid free water  GASTROINTESTINAL A:   Dysphagia Concern of gastric stress  P:   NPO PPI Will start TF Iif not extubated D cpepcid, add ppi BID   HEMATOLOGIC A:   S/P TPA P:  Trend CBC Monitor for S&S bleeding   INFECTIOUS A:   Leukocytosis  P:   Trend WBC and Fever Curve   ENDOCRINE A:   Hyperglycemia    P:   Trend Glucose with am chem   NEUROLOGIC A:   Left ICA and Left MCA occlusion s/p revascularization  Some improvemnt clinicaly P:   RASS goal: 0  MRI Brain reviewed PT/OT when able  Wean propofol to achieve RASS PRN fentanyl   FAMILY  - Updates: Updated wife and son and daughter  - Inter-disciplinary family meet or Palliative Care meeting due by:  5/28  Ccm time 35 min   Mcarthur Rossetti. Tyson Alias, MD, FACP Pgr: [phone]  Pulmonary & Critical Care

## 2016-09-28 NOTE — Evaluation (Signed)
Speech Language Pathology Evaluation Patient Details Name: Kuzey Ogata MRN: [id number] DOB: 13-Feb-1956 Today's Date: 09/28/2016 Time: 4098-1191 SLP Time Calculation (min) (ACUTE ONLY): 23 min  Problem List:  Patient Active Problem List   Diagnosis Date Noted  . 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 S/P right THA, AA 04/26/2016   Past Medical History:  Past Medical History:  Diagnosis Date  . Arthritis    Osteoarthritis- right hip  . Coronary artery disease   . GERD (gastroesophageal reflux disease)   . Hypertension   . Myocardial infarction (HCC)    " mild"-Dr. Abbey Chatters follows -gives clearance.  Marland Kitchen Urethral stricture    'uses self [place] caths weekly" to correct.   Past Surgical History:  Past Surgical History:  Procedure Laterality Date  . BACK SURGERY     lumbar discectomy  . CARDIAC CATHETERIZATION    . CORONARY ARTERY BYPASS GRAFT     09-20-2012 x3 -[place]  . IR PERCUTANEOUS ART THROMBECTOMY/INFUSION INTRACRANIAL INC DIAG ANGIO  09/26/2016  . RADIOLOGY WITH ANESTHESIA N/A 09/26/2016   Procedure: RADIOLOGY WITH ANESTHESIA;  Surgeon: Julieanne Cotton, MD;  Location: [facility] OR;  Service: Radiology;  Laterality: N/A;  . TOTAL HIP ARTHROPLASTY Right 04/26/2016   Procedure: RIGHT TOTAL HIP ARTHROPLASTY ANTERIOR APPROACH;  Surgeon: Durene Romans, MD;  Location: [facility] ORS;  Service: Orthopedics;  Laterality: Right;  Marland Kitchen VASECTOMY    . WRIST SURGERY     "implant of plastic bone"-mild ROM limits   HPI:  Pt is a 61 year old male transferred from [facility] on 5/21 as a code stroke. He presented with R sided weakness, difficulty speaking, and swelling to R side of tongue with MRI showing an acute large L MCA infarct with petechial hemorrhage, s/p tPA. He was intubated 5/21-5/23. PMH includes: CAD s/p CABG 2014, GERD, HTN, MI, urethral stricture, and recent admission in December 2017 for right total hip replacement.    Assessment / Plan /  Recommendation Clinical Impression  Pt has an expressive > receptive mixed aphasia, with verbal expression without any clear words despite attempts to repeat. He will nod his head yes/no in spontaneous conversation, but during structured trials to assess for comprehension, he does not use gestures to clearly indicate his response (Mod-Max cues provided for head nods, thumbs up, pointing). He needs visual cues and occasional hand-over-hand assist for completion of one-step commands. He seems to show some basic problem solving and awareness, particularly of his physical impairments. He will attempt to use his right hand with help from his left hand, eventually switching to his left hand as needed. Pt will benefit from intensive SLP f/u to maximize functional communication.    SLP Assessment  SLP Recommendation/Assessment: Patient needs continued Speech Lanaguage Pathology Services SLP Visit Diagnosis: Aphasia (R47.01)    Follow Up Recommendations  Inpatient Rehab    Frequency and Duration min 2x/week  2 weeks      SLP Evaluation Cognition  Overall Cognitive Status: Difficult to assess (aphasia) Arousal/Alertness: Awake/alert Orientation Level: Intubated/Tracheostomy - Unable to assess Problem Solving: Appears intact (with basic, functional tasks)       Comprehension  Auditory Comprehension Overall Auditory Comprehension: Impaired Yes/No Questions: Impaired Basic Biographical Questions: 0-25% accurate Commands: Impaired One Step Basic Commands: 50-74% accurate Reading Comprehension Reading Status: Not tested (deferred - does not have glasses)    Expression Expression Primary Mode of Expression: Verbal Verbal Expression Overall Verbal Expression: Impaired Initiation: Impaired Automatic Speech:  (attempted name and counting) Level of Generative/Spontaneous Verbalization:  (sounds)  Repetition: Impaired Level of Impairment: Word level Naming: Impairment Verbal Errors: Other  (comment) (no clear words produced, mostly sounds) Non-Verbal Means of Communication: Gestures   Oral / Motor  Oral Motor/Sensory Function Overall Oral Motor/Sensory Function: Moderate impairment Facial ROM: Reduced right;Suspected CN VII (facial) dysfunction Facial Symmetry: Abnormal symmetry right;Suspected CN VII (facial) dysfunction Facial Strength: Reduced right;Suspected CN VII (facial) dysfunction Lingual ROM: Within Functional Limits Lingual Symmetry: Abnormal symmetry right;Suspected CN XII (hypoglossal) dysfunction Mandible: Within Functional Limits Motor Speech Overall Motor Speech:  (difficult to assess given aphasia)   GO                    Maxcine Hamaiewonsky, Dvon Jiles 09/28/2016, 3:20 PM  Maxcine HamLaura Paiewonsky, M.A. CCC-SLP [phone]

## 2016-09-29 ENCOUNTER — Inpatient Hospital Stay (HOSPITAL_COMMUNITY): Payer: BLUE CROSS/BLUE SHIELD

## 2016-09-29 DIAGNOSIS — Z789 Other specified health status: Secondary | ICD-10-CM

## 2016-09-29 DIAGNOSIS — I63232 Cerebral infarction due to unspecified occlusion or stenosis of left carotid arteries: Secondary | ICD-10-CM

## 2016-09-29 DIAGNOSIS — I6300 Cerebral infarction due to thrombosis of unspecified precerebral artery: Secondary | ICD-10-CM

## 2016-09-29 LAB — BASIC METABOLIC PANEL
ANION GAP: 5 (ref 5–15)
BUN: 20 mg/dL (ref 6–20)
CHLORIDE: 109 mmol/L (ref 101–111)
CO2: 28 mmol/L (ref 22–32)
Calcium: 8 mg/dL — ABNORMAL LOW (ref 8.9–10.3)
Creatinine, Ser: 0.78 mg/dL (ref 0.61–1.24)
GFR calc Af Amer: >60 mL/min (ref >60)
GLUCOSE: 103 mg/dL — AB (ref 65–99)
POTASSIUM: 4 mmol/L (ref 3.5–5.1)
Sodium: 142 mmol/L (ref 135–145)

## 2016-09-29 LAB — CBC
HEMATOCRIT: 31.1 % — AB (ref 39.0–52.0)
HEMOGLOBIN: 9.6 g/dL — AB (ref 13.0–17.0)
MCH: 27.2 pg (ref 26.0–34.0)
MCHC: 30.9 g/dL (ref 30.0–36.0)
MCV: 88.1 fL (ref 78.0–100.0)
PLATELETS: 296 10*3/uL (ref 150–400)
RBC: 3.53 MIL/uL — AB (ref 4.22–5.81)
RDW: 16 % — ABNORMAL HIGH (ref 11.5–15.5)
WBC: 12 10*3/uL — ABNORMAL HIGH (ref 4.0–10.5)

## 2016-09-29 MED ORDER — SODIUM CHLORIDE 0.9 % IV SOLN
INTRAVENOUS | Status: DC
  Administered 2016-09-29: 18:00:00 via INTRAVENOUS

## 2016-09-29 MED ORDER — CHLORHEXIDINE GLUCONATE 0.12 % MT SOLN
15.0000 mL | Freq: Two times a day (BID) | OROMUCOSAL | Status: DC
  Administered 2016-09-29: 15 mL via OROMUCOSAL
  Filled 2016-09-29: qty 15

## 2016-09-29 MED ORDER — ORAL CARE MOUTH RINSE: 15.0000 mL | Freq: Two times a day (BID) | OROMUCOSAL | Status: DC

## 2016-09-29 MED ORDER — ORAL CARE MOUTH RINSE
15.0000 mL | Freq: Two times a day (BID) | OROMUCOSAL | Status: DC
  Administered 2016-09-29 – 2016-09-30 (×3): 15 mL via OROMUCOSAL

## 2016-09-29 NOTE — Progress Notes (Addendum)
  Speech Language Pathology Treatment: Dysphagia;Cognitive-Linquistic  Patient Details Name: Johnny Sullivan MRN: [id number] DOB: 09/06/1955 Today's Date: 09/29/2016 Time: [id number] SLP Time Calculation (min) (ACUTE ONLY): 47 min  Assessment / Plan / Recommendation Clinical Impression  Pt has less subswallows per bolus and less overt coughing with intake; however, he cannot complete the 3 ounce water screen without delayed, prolonged coughing. Recommend to proceed with MBS to better assess swallowing function prior to making diet recommendations. He could consume ice chips after oral care and meds crushed in puree pending study scheduled for later this morning.  Pt remains aphasic with both receptive and expressive deficits. He appears to have adequate basic cognitive function, with good awareness and appropriate emotional response to communication difficulties. He needs Mod-Max gestural/visual cues for completion of one-step commands outside of functional context. He attempted writing and did write clear words, although they were inappropriate to the conversation ("I Johnny that" and "don't they"). SLP also provided a communication board with pictures and written words with pt adamantly pointing to the picture of his doctor until MD entered the room. While this appeared functional, he was not able to identify any additional pictures when asked during structured trials. He will need intensive SLP f/u.   HPI HPI: Pt is a 61 year old male [facility] as a code stroke. He presented with R sided weakness, difficulty speaking, and swelling to R side of tongue with MRI showing an acute large L MCA infarct with petechial hemorrhage, s/p tPA. He was intubated 5/21-5/23. PMH includes: CAD s/p CABG 2014, GERD, HTN, MI, urethral stricture, and recent admission in December 2017 for right total hip replacement.       SLP Plan  MBS       Recommendations  Diet recommendations:  Other(comment) (NPO except ice chips/meds) Medication Administration: Crushed with puree                Oral Care Recommendations: Oral care QID;Oral care prior to ice chip/H20 Follow up Recommendations: CIR (or Outpatient SLP if he does not qualify);24 hour supervision/assistance SLP Visit Diagnosis: Dysphagia, unspecified (R13.10);Aphasia (R47.01) Plan: MBS       GO                Maxcine Hamaiewonsky, Marleena Shubert 09/29/2016, 9:59 AM  Maxcine HamLaura Paiewonsky, M.A. CCC-SLP [phone]

## 2016-09-29 NOTE — Progress Notes (Signed)
Occupational Therapy Evaluation Patient Details Name: Johnny Sullivan MRN: [id number] DOB: Apr 30, 1956 Today's Date: 09/29/2016    History of Present Illness Pt is a 61 yo male admitted to to the hospital with complaints of stroke symptoms including inability to talk and R sided weakness. CTA revealed left ICA occlusion and a short occluded segment of the proximal left MCA. tPA administered at [facility] prior to arriving at [facility].    Clinical Impression   PTA, pt independent with ADL and mobility. Pt seen for 2 visits. Pt/family requesting to address feeding to enable pt to begin feeding himself with affected RUE (see ADL section). Pt presents with significant deficits with ADL and functional use of RUE and will benefit from OT to maximize functional level of independence. Recommend follow up with OT at the neuro outpt center.     Follow Up Recommendations  Outpatient OT;Supervision/Assistance - 24 hour (neuro outpt)    Equipment Recommendations  None recommended by OT    Recommendations for Other Services       Precautions / Restrictions Precautions Precautions: Fall Restrictions Weight Bearing Restrictions: No      Mobility Bed Mobility               General bed mobility comments: OOB in chair  Transfers                 General transfer comment: Pt walking with family    Balance Overall balance assessment: Needs assistance Sitting-balance support: No upper extremity supported;Feet supported Sitting balance-Leahy Scale: Good     Standing balance support: During functional activity Standing balance-Leahy Scale: Good                             ADL either performed or assessed with clinical judgement   ADL Overall ADL's : Needs assistance/impaired Eating/Feeding: Supervision/ safety;Moderate assistance;Sitting Eating/Feeding Details (indicate cue type and reason): during second visit addressed self feeding per pt/family goal. Tubing  modified to simulate u-cuff so pt could begin to self feed using R affected hand. Encouraged pt/family to use adaptation with pudding/mashed potatoe consistenciy food. Family verbalized understanding. Pt able to feed self using adapted spoon with set up and min vc.  Grooming: Minimal assistance   Upper Body Bathing: Minimal assistance;Sitting   Lower Body Bathing: Minimal assistance;Sit to/from stand   Upper Body Dressing : Moderate assistance;Sitting   Lower Body Dressing: Moderate assistance;Sit to/from stand Lower Body Dressing Details (indicate cue type and reason): trying to use R hand in tasks Toilet Transfer: Supervision/safety   Toileting- Clothing Manipulation and Hygiene: Supervision/safety;Sit to/from stand       Functional mobility during ADLs: Supervision/safety General ADL Comments: Pt attempting to use RUE in tasks     Vision   Additional Comments: to further assess     Perception Perception Comments: no over/undershooting noted. Good use of space. Will further assess   Praxis Praxis Praxis tested?: Deficits Praxis-Other Comments: pt appears to have difficulty with motor planning. success improves with multiple repetitions and hadn over hadn to organize movement pattern    Pertinent Vitals/Pain Pain Assessment: Faces Faces Pain Scale: No hurt     Hand Dominance Right   Extremity/Trunk Assessment Upper Extremity Assessment Upper Extremity Assessment: RUE deficits/detail RUE Deficits / Details: Stronger proximally. Moves out of synergy pattern. No isolated finger movement. Brunstrom levelIII hand and V armable to complete hand to mouth and hand to forehead. Unable ot touch top /behind  head or behind back. Gross cylindrical grasp. no pinch RUE Coordination: decreased fine motor;decreased gross motor   Pt very motivated to use his dominant RUE    Lower Extremity Assessment RLE Deficits / Details: 3+/5 strength grossly  LLE Deficits / Details: 4/5 strength  grossly    Cervical / Trunk Assessment Cervical / Trunk Assessment: Other exceptions (L trunk shortening with shoulder elevation (compensates))   Communication Communication Communication: Expressive difficulties;Receptive difficulties   Cognition Arousal/Alertness: Awake/alert Behavior During Therapy: WFL for tasks assessed/performed Overall Cognitive Status: Difficult to assess                                 General Comments: Pt able ot use items appropirately; appears to be able to complete tasks in contextual environemtn. Appears to have difficulty with motor planning   General Comments       Exercises     Shoulder Instructions      Home Living Family/patient expects to be discharged to:: Private residence Living Arrangements: Spouse/significant other Available Help at Discharge: Family Type of Home: House Home Access: Stairs to enter Secretary/administratorntrance Stairs-Number of Steps: 1 Entrance Stairs-Rails: None Home Layout: One level     Bathroom Shower/Tub: Producer, television/film/videoWalk-in shower   Bathroom Toilet: Standard Bathroom Accessibility: Yes How Accessible: Accessible via walker Home Equipment: Walker - 2 wheels;Wheelchair - manual          Prior Functioning/Environment Level of Independence: Independent                 OT Problem List: Decreased strength;Decreased range of motion;Decreased coordination;Cardiopulmonary status limiting activity;Impaired tone;Impaired UE functional use;Obesity      OT Treatment/Interventions: Self-care/ADL training;Therapeutic exercise;Neuromuscular education;DME and/or AE instruction;Therapeutic activities;Cognitive remediation/compensation;Patient/family education    OT Goals(Current goals can be found in the care plan section) Acute Rehab OT Goals Patient Stated Goal: unable to state OT Goal Formulation: With patient/family Time For Goal Achievement: 10/13/16 Potential to Achieve Goals: Good ADL Goals Pt Will Perform Eating:  with set-up;with supervision;with adaptive utensils;sitting Pt Will Perform Upper Body Bathing: with set-up;with supervision;sitting Pt Will Perform Upper Body Dressing: with set-up;with supervision;sitting;with caregiver independent in assisting Pt Will Transfer to Toilet: with set-up;with supervision;ambulating (with caregiver assisting) Pt Will Perform Toileting - Clothing Manipulation and hygiene: with modified independence Pt/caregiver will Perform Home Exercise Program: Increased strength;Right Upper extremity;With written HEP provided;With Supervision (strength/fine motor/coordination)  OT Frequency: Min 3X/week   Barriers to D/C:            Co-evaluation              AM-PAC PT "6 Clicks" Daily Activity     Outcome Measure Help from another person eating meals?: A Little Help from another person taking care of personal grooming?: A Little Help from another person toileting, which includes using toliet, bedpan, or urinal?: A Little Help from another person bathing (including washing, rinsing, drying)?: A Lot Help from another person to put on and taking off regular upper body clothing?: A Lot Help from another person to put on and taking off regular lower body clothing?: A Lot 6 Click Score: 15   End of Session Nurse Communication: Other (comment) (use of AE for feeding)  Activity Tolerance: Patient tolerated treatment well Patient left: in chair;with call bell/phone within reach;with family/visitor present  OT Visit Diagnosis: Muscle weakness (generalized) (M62.81);Apraxia (R48.2);Cognitive communication deficit (R41.841) Symptoms and signs involving cognitive functions: Cerebral infarction  Time: 4098-1191 OT Time Calculation (min): 15 min  2nd visit:1697-1625 Time:18 min Charges:  OT General Charges $OT Visit:  (2 visits) OT Evaluation $OT Eval Moderate Complexity: 1 Procedure OT Treatments $Self Care/Home Management : 8-22 mins G-Codes:      [facility], OT/L  [phone] 09/29/2016  Greg Cratty,HILLARY 09/29/2016, 5:08 PM

## 2016-09-29 NOTE — Consult Note (Signed)
PULMONARY / CRITICAL CARE MEDICINE   Name: Johnny Sullivan MRN: [id number] DOB: 07/19/1955    ADMISSION DATE:  09/26/2016 CONSULTATION DATE:  09/26/2016  REFERRING MD:  Dr. Otelia LimesLindzen   CHIEF COMPLAINT:  CVA  HISTORY OF PRESENT ILLNESS:   61 year old male with PMH of CAD s/p CABG 2014, GERD, HTN, MI, and urethral stricture, recent admission in December 2017 for right total hip replacement.   Presents to [facility] on 5/21 as a Code Stroke. 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 CT revealed Left ICA and Prox Left MCA occlusions. [facility] with noted swelling to right side of his tongue > given solumedrol, benadryl, and pepcid. Patient was intubated and take to IR for left common carotid arteriogram followed by endovascular revascularization. PCCM asked to consult for medical/vent management.    SUBJECTIVE:  Extubated No distress  VITAL SIGNS: BP (!) 132/58   Pulse 60   Temp 98 F (36.7 C) (Oral)   Resp 14   Ht 5\' 9"  (1.753 m)   Wt 107.3 kg (236 lb 8.9 oz)   SpO2 100%   BMI 34.93 kg/m   HEMODYNAMICS:    VENTILATOR SETTINGS: Vent Mode: PRVC FiO2 (%):  [40 %] 40 % Set Rate:  [16 bmp] 16 bmp Vt Set:  [570 mL] 570 mL PEEP:  [5 cmH20] 5 cmH20 Plateau Pressure:  [15 cmH20] 15 cmH20  INTAKE / OUTPUT: I/O last 3 completed shifts: In: 1954.3 [I.V.:1854.3; IV Piggyback:100] Out: 2050 [Urine:1775; Emesis/NG output:275]  PHYSICAL EXAMINATION: General:extubated without any distress Neuro: fc, weak rt arm, perr, aphasia HEENT: jvd wnl, no bruit PULM: CTA, tongue wnl CV:  s1 s2 RRR  GI: soft, BS wnl no r or  g  Extremities: edema none   LABS:  BMET  Recent Labs Lab 09/27/16 0210 09/28/16 0500 09/29/16 0208  NA 141 140 142  K 4.1 4.7 4.0  CL 109 109 109  CO2 22 25 28   BUN 15 17 20   CREATININE  0.87 0.79 0.78  GLUCOSE 171* 149* 103*    Electrolytes  Recent Labs Lab 09/27/16 0210 09/28/16 0500 09/29/16 0208  CALCIUM 8.3* 8.2* 8.0*  MG 2.2  --   --   PHOS 4.3  --   --     CBC  Recent Labs Lab 09/27/16 0210 09/28/16 0500 09/29/16 0208  WBC 12.0* 15.5* 12.0*  HGB 10.8* 10.0* 9.6*  HCT 34.2* 33.0* 31.1*  PLT 310 324 296    Coag's  Recent Labs Lab 09/26/16 1851  APTT 28  INR 0.98    Sepsis Markers No results for input(s): LATICACIDVEN, PROCALCITON, O2SATVEN in the last 168 hours.  ABG  Recent Labs Lab 09/27/16 0035  PHART 7.290*  PCO2ART 46.7  PO2ART 103.0    Liver Enzymes  Recent Labs Lab 09/26/16 1851  AST 34  ALT 28  ALKPHOS 95  BILITOT 0.5  ALBUMIN 4.5    Cardiac Enzymes No results for input(s): TROPONINI, PROBNP in the last 168 hours.  Glucose No results for input(s): GLUCAP in the last 168 hours.  Imaging Ct Head Wo Contrast  Result Date: 09/28/2016 CLINICAL DATA:  Recent stroke EXAM: CT HEAD WITHOUT CONTRAST TECHNIQUE: Contiguous axial images were obtained from the base of the skull through the vertex without intravenous contrast. COMPARISON:  Brain  MRI 09/27/2016 Head CT 09/26/2016 FINDINGS: Brain: Continued expected evolution of hypoattenuation at the site of left MCA infarct. Small amount of residual hyperdensity is decreased from the previous study and likely indicates resolving contrast staining. There is minimal rightward midline shift of 2 mm. No hydrocephalus. Unchanged appearance of the remainder of the brain. Vascular: No hyperdense vessel or unexpected calcification. Skull: Normal visualized skull base, calvarium and extracranial soft tissues. Sinuses/Orbits: No sinus fluid levels or advanced mucosal thickening. No mastoid effusion. Normal orbits. IMPRESSION: 1. Continued expected evolution of left MCA infarct without new acute finding. 2. Trace residual hyperdensity in this area is favored to indicate contrast staining.  Continued attention on follow-up is recommended to exclude hemorrhage. Electronically Signed   By: Deatra Robinson M.D.   On: 09/28/2016 14:07     STUDIES:  CT Head 5/21 > Emergent large vessel occlusion affecting the LEFT ICA terminus and proximal LEFT MCA. ASPECTS is 8, early hypoattenuation of the LEFT insula and LEFT anterolateral temporal cortex CTA Head/Neck 5/21 > Acute LEFT internal carotid artery occlusion,  Atherosclerosis without hemodynamically significant stenosis. Multilevel severe neural foraminal narrowing. Occluded LEFT ICA with reconstitution LEFT carotid terminus. Occluded proximal LEFT M1 segment with thready collateralization. Diminutive RIGHT anterior cerebral artery favoring normal variant. MRI 5/22>>MRI HEAD: Acute large LEFT MCA territory infarct with petechial hemorrhage. Mild chronic small vessel ischemic disease  CULTURES: None.  ANTIBIOTICS: None.  SIGNIFICANT EVENTS: 5/21 > Presents as Code Stroke > Taken to IR for revascularization  5/23 extubated  LINES/TUBES: ETT 5/21 >>  DISCUSSION: 61 year old male with occluded left ICA and left M1 segment taken to IR for revascularization.   ASSESSMENT / PLAN:  PULMONARY A: Respiratory Insufficiency in setting of CVA Angioedema > improving s/p benadryl/solu-medrol/pepcid in ED  P:   No role further steroids, pepcid, benadryl O2 as needed to says 92%   CARDIOVASCULAR A:  H/O HTN, CAD s/p CABG P:  Cardiac Monitoring  Off  Cleviprex to Maintain Systolic 120-140 per Neurology, consider loser goals Continue home Lipitor, hold home metoprolol  Meeting map goals   RENAL A:   H/O Urethral Stricture  P:   Trend BMP Urology note reviewed will need this taken care of pre dc Avoid free water  GASTROINTESTINAL A:   Dysphagia Concern of gastric stress  P:   SLP ppi bid   HEMATOLOGIC A:   S/P TPA P:  No signs bleeding  INFECTIOUS A:   Leukocytosis  P:   Trend WBC and Fever Curve    ENDOCRINE A:   Hyperglycemia    P:   Trend Glucose with am chem , no cbg needed  NEUROLOGIC A:   Left ICA and Left MCA occlusion s/p revascularization  Some improvemnt clinicaly P:   RASS goal: 0  MRI Brain reviewed PT at bedside PRN fentanyl - dc  FAMILY  - Updates: Updated wife and son and daughter  - Inter-disciplinary family meet or Palliative Care meeting due by:  5/28  Will so Mcarthur Rossetti. Tyson Alias, MD, FACP Pgr: [phone] [facility]

## 2016-09-29 NOTE — Progress Notes (Signed)
    [facility] has been requested to perform a transesophageal echocardiogram on Johnny Sullivan for cerebrovascular accident. After careful review of history and examination, the risks and benefits of transesophageal echocardiogram have been explained including risks of esophageal damage, perforation (1:10,000 risk), bleeding, pharyngeal hematoma as well as other potential complications associated with conscious sedation including aspiration, arrhythmia, respiratory failure and death. Alternatives to treatment were discussed, questions were answered. Patient is willing to proceed.   The patient is aphasic but appears to be A&Ox3 and understood the procedure. This was reviewed in detail with his sister and brother-in-law at the beside. I also called the patient's wife Johnny Braun(Karen) and reviewed the procedure in full detail as well.  Recent labs showed WBC of 12.0, Hgb 9.6, and platelets 296. BP has remained stable with no requirement for pressors.   TEE is scheduled on 09/30/2016 at 1400 with Dr. Shirlee LatchMcLean.   Ellsworth LennoxBrittany M Marikay Roads, PA-C  09/29/2016 3:52 PM

## 2016-09-29 NOTE — Progress Notes (Signed)
3 Days Post-Op  Subjective: Mr. Zambito is extubated and alert but aphasic.   The SP Tube is draining well.   ROS:  Review of Systems  Unable to perform ROS: Patient nonverbal    Anti-infectives: Anti-infectives    Start     Dose/Rate Route Frequency Ordered Stop   09/26/16 2203  ceFAZolin (ANCEF) 2-4 GM/100ML-% IVPB    Comments:  Ferd Hibbs   : cabinet override      09/26/16 2203 09/27/16 0013      Current Facility-Administered Medications  Medication Dose Route Frequency Provider Last Rate Last Dose  .  stroke: mapping our early stages of recovery book   Does not apply Once Caryl Pina, MD      . 0.9 %  sodium chloride infusion   Intravenous Continuous Nelda Bucks, MD 10 mL/hr at 09/28/16 1900    . 0.9 %  sodium chloride infusion  250 mL Intravenous PRN Tobey Grim, NP      . acetaminophen (TYLENOL) tablet 650 mg  650 mg Oral Q4H PRN Deveshwar, Simonne Maffucci, MD       Or  . acetaminophen (TYLENOL) solution 650 mg  650 mg Per Tube Q4H PRN Deveshwar, Simonne Maffucci, MD       Or  . acetaminophen (TYLENOL) suppository 650 mg  650 mg Rectal Q4H PRN Deveshwar, Sanjeev, MD      . aspirin tablet 325 mg  325 mg Oral Daily Marvel Plan, MD   325 mg at 09/28/16 1600   Or  . aspirin suppository 300 mg  300 mg Rectal Daily Marvel Plan, MD      . atorvastatin (LIPITOR) tablet 80 mg  80 mg Oral q1800 Tobey Grim, NP   80 mg at 09/28/16 1717  . chlorhexidine (PERIDEX) 0.12 % solution 15 mL  15 mL Mouth Rinse BID Marvel Plan, MD      . clevidipine (CLEVIPREX) infusion 0.5 mg/mL  0-21 mg/hr Intravenous Continuous Julieanne Cotton, MD   Stopped at 09/27/16 1701  . enoxaparin (LOVENOX) injection 40 mg  40 mg Subcutaneous Q24H Marvel Plan, MD   40 mg at 09/28/16 1718  . fentaNYL (SUBLIMAZE) injection 100 mcg  100 mcg Intravenous Q2H PRN Tobey Grim, NP   100 mcg at 09/28/16 0400  . folic acid injection 1 mg  1 mg Intravenous Daily Tobey Grim, NP   1 mg at  09/28/16 0959  . MEDLINE mouth rinse  15 mL Mouth Rinse q12n4p Marvel Plan, MD      . ondansetron [facility]) injection 4 mg  4 mg Intravenous Q6H PRN Deveshwar, Sanjeev, MD      . pantoprazole (PROTONIX) injection 40 mg  40 mg Intravenous Q12H Nelda Bucks, MD   40 mg at 09/28/16 2200  . propofol (DIPRIVAN) 1000 MG/100ML infusion  0-50 mcg/kg/min Intravenous Continuous Tobey Grim, NP   Stopped at 09/28/16 1005  . senna-docusate (Senokot-S) tablet 1 tablet  1 tablet Oral QHS PRN Caryl Pina, MD      . thiamine (B-1) injection 100 mg  100 mg Intravenous Daily Tobey Grim, NP   100 mg at 09/28/16 0954     Objective: Vital signs in last 24 hours: Temp:  [98.1 F (36.7 C)-99 F (37.2 C)] 98.1 F (36.7 C) (05/24 0400) Pulse Rate:  [57-89] 57 (05/24 0600) Resp:  [0-24] 13 (05/24 0600) BP: (109-151)/(57-91) 131/69 (05/24 0600) SpO2:  [93 %-100 %] 100 % (05/24 0600) Arterial Line BP: (144-188)/(59-72) 171/64 (05/23  0915) FiO2 (%):  [40 %] 40 % (05/23 0856) Weight:  [107.3 kg (236 lb 8.9 oz)] 107.3 kg (236 lb 8.9 oz) (05/24 0500)  Intake/Output from previous day: 05/23 0701 - 05/24 0700 In: 720.2 [I.V.:720.2] Out: 1125 [Urine:1125] Intake/Output this shift: Total I/O In: 110 [I.V.:110] Out: 475 [Urine:475]   Physical Exam  Lab Results:   Recent Labs  09/28/16 0500 09/29/16 0208  WBC 15.5* 12.0*  HGB 10.0* 9.6*  HCT 33.0* 31.1*  PLT 324 296   BMET  Recent Labs  09/28/16 0500 09/29/16 0208  NA 140 142  K 4.7 4.0  CL 109 109  CO2 25 28  GLUCOSE 149* 103*  BUN 17 20  CREATININE 0.79 0.78  CALCIUM 8.2* 8.0*   PT/INR  Recent Labs  09/26/16 1851  LABPROT 13.0  INR 0.98   ABG  Recent Labs  09/27/16 0035  PHART 7.290*  HCO3 22.6    Studies/Results: Ct Head Wo Contrast  Result Date: 09/28/2016 CLINICAL DATA:  Recent stroke EXAM: CT HEAD WITHOUT CONTRAST TECHNIQUE: Contiguous axial images were obtained from the base of the skull  through the vertex without intravenous contrast. COMPARISON:  Brain MRI 09/27/2016 Head CT 09/26/2016 FINDINGS: Brain: Continued expected evolution of hypoattenuation at the site of left MCA infarct. Small amount of residual hyperdensity is decreased from the previous study and likely indicates resolving contrast staining. There is minimal rightward midline shift of 2 mm. No hydrocephalus. Unchanged appearance of the remainder of the brain. Vascular: No hyperdense vessel or unexpected calcification. Skull: Normal visualized skull base, calvarium and extracranial soft tissues. Sinuses/Orbits: No sinus fluid levels or advanced mucosal thickening. No mastoid effusion. Normal orbits. IMPRESSION: 1. Continued expected evolution of left MCA infarct without new acute finding. 2. Trace residual hyperdensity in this area is favored to indicate contrast staining. Continued attention on follow-up is recommended to exclude hemorrhage. Electronically Signed   By: Deatra Robinson M.D.   On: 09/28/2016 14:07   Mr Brain Wo Contrast  Result Date: 09/27/2016 CLINICAL DATA:  Follow-up LEFT internal carotid artery occlusion and LEFT MCA stroke. Status post endovascular revascularization LEFT internal carotid artery, LEFT MCA and LEFT ACA. EXAM: MRI HEAD WITHOUT CONTRAST MRA HEAD WITHOUT CONTRAST TECHNIQUE: Multiplanar, multiecho pulse sequences of the brain and surrounding structures were obtained without intravenous contrast. Angiographic images of the head were obtained using MRA technique without contrast. COMPARISON:  CT and CTA  HEAD Sep 26, 2016. FINDINGS: MRI HEAD FINDINGS BRAIN: Patchy to confluent reduced diffusion LEFT frontoparietal and temporal lobes, to lesser extent LEFT basal ganglia with low ADC values. Punctate areas of susceptibility artifact LEFT frontal lobe, LEFT insula, some which correspond to density on prior CT. No lobar hematoma. Regional LEFT cerebrum mass effect without significant midline shift. No  hydrocephalus. A few scattered supratentorial subcentimeter white matter FLAIR T2 hyperintensities exclusive a aforementioned abnormality compatible with mild chronic small vessel ischemic disease. VASCULAR: Normal major intracranial vascular flow voids present at skull base. SKULL AND UPPER CERVICAL SPINE: No abnormal sellar expansion. No suspicious calvarial bone marrow signal. Craniocervical junction maintained. SINUSES/ORBITS: The mastoid air-cells and included paranasal sinuses are well-aerated. The included ocular globes and orbital contents are non-suspicious. OTHER: None. MRA HEAD FINDINGS ANTERIOR CIRCULATION: Flow related enhancement of the included cervical, petrous, cavernous and supraclinoid internal carotid arteries. Pulsation artifact limits assessment of the carotid terminus bilaterally. Patent anterior communicating artery. Developmentally smaller RIGHT anterior cerebral artery. Severe stenosis LEFT M2 versus M3 origin given early bifurcation. No large  vessel occlusion, abnormal luminal irregularity, aneurysm. POSTERIOR CIRCULATION: LEFT vertebral artery is dominant. Basilar artery is patent, with normal flow related enhancement of the main branch vessels. Normal flow related enhancement of the posterior cerebral arteries. Small LEFT and robust RIGHT posterior communicating artery is present. No large vessel occlusion, high-grade stenosis, abnormal luminal irregularity, aneurysm. ANATOMIC VARIANTS: None. Source images and MIP images were reviewed. IMPRESSION: MRI HEAD: Acute large LEFT MCA territory infarct with petechial hemorrhage. Mild chronic small vessel ischemic disease. MRA HEAD: Successful re- vascularization of LEFT internal carotid artery. Severe stenosis LEFT M2 vs. M3 origin, possible residual thromboembolism. Electronically Signed   By: Awilda Metro M.D.   On: 09/27/2016 20:14   Mr Maxine Glenn Head/brain ZO Cm  Result Date: 09/27/2016 CLINICAL DATA:  Follow-up LEFT internal carotid  artery occlusion and LEFT MCA stroke. Status post endovascular revascularization LEFT internal carotid artery, LEFT MCA and LEFT ACA. EXAM: MRI HEAD WITHOUT CONTRAST MRA HEAD WITHOUT CONTRAST TECHNIQUE: Multiplanar, multiecho pulse sequences of the brain and surrounding structures were obtained without intravenous contrast. Angiographic images of the head were obtained using MRA technique without contrast. COMPARISON:  CT and CTA  HEAD Sep 26, 2016. FINDINGS: MRI HEAD FINDINGS BRAIN: Patchy to confluent reduced diffusion LEFT frontoparietal and temporal lobes, to lesser extent LEFT basal ganglia with low ADC values. Punctate areas of susceptibility artifact LEFT frontal lobe, LEFT insula, some which correspond to density on prior CT. No lobar hematoma. Regional LEFT cerebrum mass effect without significant midline shift. No hydrocephalus. A few scattered supratentorial subcentimeter white matter FLAIR T2 hyperintensities exclusive a aforementioned abnormality compatible with mild chronic small vessel ischemic disease. VASCULAR: Normal major intracranial vascular flow voids present at skull base. SKULL AND UPPER CERVICAL SPINE: No abnormal sellar expansion. No suspicious calvarial bone marrow signal. Craniocervical junction maintained. SINUSES/ORBITS: The mastoid air-cells and included paranasal sinuses are well-aerated. The included ocular globes and orbital contents are non-suspicious. OTHER: None. MRA HEAD FINDINGS ANTERIOR CIRCULATION: Flow related enhancement of the included cervical, petrous, cavernous and supraclinoid internal carotid arteries. Pulsation artifact limits assessment of the carotid terminus bilaterally. Patent anterior communicating artery. Developmentally smaller RIGHT anterior cerebral artery. Severe stenosis LEFT M2 versus M3 origin given early bifurcation. No large vessel occlusion, abnormal luminal irregularity, aneurysm. POSTERIOR CIRCULATION: LEFT vertebral artery is dominant. Basilar  artery is patent, with normal flow related enhancement of the main branch vessels. Normal flow related enhancement of the posterior cerebral arteries. Small LEFT and robust RIGHT posterior communicating artery is present. No large vessel occlusion, high-grade stenosis, abnormal luminal irregularity, aneurysm. ANATOMIC VARIANTS: None. Source images and MIP images were reviewed. IMPRESSION: MRI HEAD: Acute large LEFT MCA territory infarct with petechial hemorrhage. Mild chronic small vessel ischemic disease. MRA HEAD: Successful re- vascularization of LEFT internal carotid artery. Severe stenosis LEFT M2 vs. M3 origin, possible residual thromboembolism. Electronically Signed   By: Awilda Metro M.D.   On: 09/27/2016 20:14     Assessment and Plan: Urethral stricture s/p SP tube insertion.   Tube is draining well.   The small SP tube is not sufficient for long term drainage and he will either need conversion to a larger tube by IR prior to discharge or a trip to the OR in the next couple of weeks for cystoscopy and urethral dilation depending on his recovery.   He is established with a urologist at [facility], but could see me if that is more convenient going forward.        LOS: 3 days  Anner CreteWRENN,Madine Sarr J 09/29/2016 [phone] ID: Read DriversPhillip Kugler, male   DOB: 12/01/1955, 61 y.o.   MRN: [id number]

## 2016-09-29 NOTE — Care Management Note (Signed)
Case Management Note  Patient Details  Name: Johnny Sullivan MRN: [id number] Date of Birth: 10/11/1955  Subjective/Objective:   Pt admitted on 09/26/16 with Lt MCA stroke secondary to Lt ICA occlusion and secondary focal occlusion of proximal Lt MCA.  PTA, pt independent of ADLS and living at home with spouse.                   Action/Plan: Pt currently remains intubated; wife and two sons at bedside.  Will follow for discharge planning as pt progresses.    Expected Discharge Date:                  Expected Discharge Plan:  OP Rehab (OP Rehab Services)  In-House Referral:     Discharge planning Services  CM Consult  Post Acute Care Choice:    Choice offered to:     DME Arranged:    DME Agency:     HH Arranged:    HH Agency:     Status of Service:  In process, will continue to follow  If discussed at Long Length of Stay Meetings, dates discussed:    Additional Comments:  09/29/16 J. Raunak Antuna, RN, BSN Pt making good progress.  PT recommending no OP follow up; OT consult pending.  Pt continues to have both expressive and receptive aphasisa, and will likely need OP speech therapy.  W[place] in [place], but [facility].  Pt will likely transfer to regular neuro floor later; updated receiving Case Manager on pt/ family preferences for rehab.    Johnny BatonJulie W. Eoghan Belcher, RN, BSN  Trauma/Neuro ICU Case Manager [phone]

## 2016-09-29 NOTE — Progress Notes (Signed)
STROKE TEAM PROGRESS NOTE   SUBJECTIVE (INTERVAL HISTORY) His wife is at the bedside.  He tolerated extubation well, but still has global aphasia. Pending venous doppler today and TEE/loop tomorrow.     OBJECTIVE Temp:  [98 F (36.7 C)-99 F (37.2 C)] 98 F (36.7 C) (05/24 0745) Pulse Rate:  [57-89] 60 (05/24 0700) Cardiac Rhythm: Normal sinus rhythm (05/23 2000) Resp:  [0-24] 14 (05/24 0700) BP: (113-151)/(57-91) 132/58 (05/24 0700) SpO2:  [93 %-100 %] 100 % (05/24 0700) Arterial Line BP: (157-188)/(61-72) 171/64 (05/23 0915) FiO2 (%):  [40 %] 40 % (05/23 0856) Weight:  [107.3 kg (236 lb 8.9 oz)] 107.3 kg (236 lb 8.9 oz) (05/24 0500)  CBC:  Recent Labs Lab 09/26/16 1851  09/27/16 0210 09/28/16 0500 09/29/16 0208  WBC 11.4*  --  12.0* 15.5* 12.0*  NEUTROABS 7.8*  --  11.4*  --   --   HGB 13.0  < > 10.8* 10.0* 9.6*  HCT 39.3  < > 34.2* 33.0* 31.1*  MCV 83.6  --  84.7 86.4 88.1  PLT 324  --  310 324 296  < > = values in this interval not displayed.  Basic Metabolic Panel:   Recent Labs Lab 09/27/16 0210 09/28/16 0500 09/29/16 0208  NA 141 140 142  K 4.1 4.7 4.0  CL 109 109 109  CO2 22 25 28   GLUCOSE 171* 149* 103*  BUN 15 17 20   CREATININE 0.87 0.79 0.78  CALCIUM 8.3* 8.2* 8.0*  MG 2.2  --   --   PHOS 4.3  --   --     Lipid Panel:     Component Value Date/Time   CHOL 108 09/27/2016 0210   TRIG 83 09/27/2016 0210   TRIG 82 09/27/2016 0210   HDL 33 (L) 09/27/2016 0210   CHOLHDL 3.3 09/27/2016 0210   VLDL 17 09/27/2016 0210   LDLCALC 58 09/27/2016 0210   HgbA1c:  Lab Results  Component Value Date   HGBA1C 6.3 (H) 09/27/2016   Urine Drug Screen:     Component Value Date/Time   LABOPIA NONE DETECTED 09/27/2016 0630   COCAINSCRNUR NONE DETECTED 09/27/2016 0630   LABBENZ NONE DETECTED 09/27/2016 0630   AMPHETMU NONE DETECTED 09/27/2016 0630   THCU NONE DETECTED 09/27/2016 0630   LABBARB NONE DETECTED 09/27/2016 0630    Alcohol Level      Component Value Date/Time   ETH <5 09/26/2016 1851    IMAGING I have personally reviewed the radiological images below and agree with the radiology interpretations.  Ct Angio Head W Or Wo Contrast Ct Angio Neck W And/or Wo Contrast 09/26/2016 CTA NECK:   Acute LEFT internal carotid artery occlusion. Atherosclerosis without hemodynamically significant stenosis. Multilevel severe neural foraminal narrowing. CTA HEAD:  Occluded LEFT ICA with reconstitution LEFT carotid terminus. Occluded proximal LEFT M1 segment with thready collateralization. Diminutive RIGHT anterior cerebral artery favoring normal variant.   Ct Head Wo Contrast 09/26/2016 1. Evolving acute left MCA territory infarct as above. Superimposed scattered serpiginous hyperdensity most likely reflects contrast staining from recent endovascular revascularization procedure. Superimposed hemorrhage not entirely excluded. Attention at follow-up recommended.  2. Otherwise stable appearance of the brain.   Ct Head Wo Contrast 09/26/2016 1. Emergent large vessel occlusion affecting the LEFT ICA terminus and proximal LEFT MCA.  2. ASPECTS is 8, early hypoattenuation of the LEFT insula and LEFT anterolateral temporal cortex.   Cerebral angiogram S/P Lt common carotid arteriogram followed by endovascular revascularization of occluded LT ICA  terminus,Lt MCA and Lt ACA with x 2 passes with Solitaire  FR 40 MM retrieval device achieving a TICI 3 reperfusion. Groin puncture to TICI 2b  26 mins, and to TICI 3 .  Ct Head Wo Contrast  09/28/2016 IMPRESSION: 1. Continued expected evolution of left MCA infarct without new acute finding. 2. Trace residual hyperdensity in this area is favored to indicate contrast staining. Continued attention on follow-up is recommended to exclude hemorrhage.   Mri and Mra Brain Wo Contrast 09/27/2016 IMPRESSION: MRI HEAD: Acute large LEFT MCA territory infarct with petechial hemorrhage. Mild chronic  small vessel ischemic disease. MRA HEAD: Successful re- vascularization of LEFT internal carotid artery. Severe stenosis LEFT M2 vs. M3 origin, possible residual thromboembolism.   TTE - Left ventricle: The cavity size was normal. There was mild   concentric hypertrophy. Systolic function was normal. The   estimated ejection fraction was in the range of 50% to 55%.   Dyskinesis of the apical myocardium; consistent with infarction   in the distribution of the left anterior descending coronary   artery. Doppler parameters are consistent with abnormal left   ventricular relaxation (grade 1 diastolic dysfunction). Doppler   parameters are consistent with elevated mean left atrial filling   pressure. Acoustic contrast opacification revealed no evidence   ofthrombus. Although there is no visible thrombus, there is very   slow, swirling flow in the dyskinetic apex. - Aortic valve: Noncoronary cusp mobility was severely restricted.   There was mild regurgitation. - Mitral valve: Calcified annulus. - Left atrium: The atrium was mildly dilated.  LE venous doppler pending  TEE pending   PHYSICAL EXAM  Temp:  [98 F (36.7 C)-99 F (37.2 C)] 98 F (36.7 C) (05/24 0745) Pulse Rate:  [57-89] 60 (05/24 0700) Resp:  [0-24] 14 (05/24 0700) BP: (113-151)/(57-91) 132/58 (05/24 0700) SpO2:  [93 %-100 %] 100 % (05/24 0700) Arterial Line BP: (157-188)/(61-72) 171/64 (05/23 0915) FiO2 (%):  [40 %] 40 % (05/23 0856) Weight:  [107.3 kg (236 lb 8.9 oz)] 107.3 kg (236 lb 8.9 oz) (05/24 0500)  General - Well nourished, well developed, not in acute distress.  Ophthalmologic - Fundi not visualized due to noncooperation.  Cardiovascular - Regular rate and rhythm.  Neuro - awake alert, still global aphasia, only able to close eyes on command, but not able to follow other commands but able to pantomime. Not able to name or repeat. PERRL, eyes attend both sides, blinking to bilateral visual threat. No  significant facial asymmetry. Pt did not open mouth on request. LUE and LLE and RLE spontaneous movement with normal strength. RUE 4/5 proximal but not following commands for wrist or finger movement. DTR 1+ and no babinski. Sensation, coordination and gag not tested.     ASSESSMENT/PLAN Johnny Sullivan is a 61 y.o. male with history of CAD with previous MI s/p CABG in 2014, hypertension, ureteral stricture and GERD presenting with aphasia and right hemiplegia. The patient received IV TPA on Monday, 09/26/2016 at 1915 hrs.  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 Superimposed small hemorrhagic transformation  CTA H&N - Acute LEFT ICA occlusion. Occluded proximal LEFT M1.   Cerebral angio - TICI3 of left ICA and left MCA as well as left ACA  MRI head - Acute large LEFT  MCA territory infarct with petechial hemorrhage  MRA head -  Left brain luxury perfusion  2D Echo - EF 50-55%  LE venous doppler - pending  Will do TEE and loop   LDL - 58  HgbA1c 6.3  VTE prophylaxis - lovenox Diet NPO time specified  aspirin 81 mg daily prior to admission, now on ASA 325mg  or ASA PR 300mg  daily.  Patient counseled to be compliant with his antithrombotic medications  Ongoing aggressive stroke risk factor management  Therapy recommendations: pending  Disposition: Pending  Left ICA occlusion   S/p thrombectomy  Concerning for embolic event  Lack of traditional stroke risk factors  Will do TEE and loop   Hypertension  Stable  BP goal 120-160 after procedure  Long-term BP goal normotensive  Hyperlipidemia  Home meds: Lipitor 80 mg daily prior to admission  LDL 58, goal < 70  lipitor resumed in hospital.  Continue statin at discharge  urethral stricture  Urinary  retention  Urology following  S/p suprapubic catheter  Alcohol use  Average daily 2-3 drinks  On B1/FA/MVI  CIWA protocol  Seizure precaution  Other Stroke Risk Factors  Advanced age  Obesity, Body mass index is 34.93 kg/m., recommend weight loss, diet and exercise as appropriate   CAD / MI s/p CABG in 2014  Other Active Problems  Mild leukocytosis  Mild anemia  Hospital day # 3  This patient is critically ill due to left MCA infarct with left ICA and MCA occlusion s/p tPA and IR procedure and at significant risk of neurological worsening, death form recurrent stroke, hemorrhagic transformation, heart failure, seizure, vasospasm. This patient's care requires constant monitoring of vital signs, hemodynamics, respiratory and cardiac monitoring, review of multiple databases, neurological assessment, discussion with family, other specialists and medical decision making of high complexity. I spent 35 minutes of neurocritical care time in the care of this patient.  Marvel PlanJindong Dermot Gremillion, MD PhD Stroke Neurology 09/29/2016 10:56 AM   To contact Stroke Continuity provider, please refer to [email or web address]. After hours, contact General Neurology

## 2016-09-29 NOTE — Progress Notes (Signed)
Modified Barium Swallow Progress Note  Patient Details  Name: Johnny Sullivan MRN: [id number] Date of Birth: 08/09/1955  Today's Date: 09/29/2016  Modified Barium Swallow completed.  Full report located under Chart Review in the Imaging Section.  Brief recommendations include the following:  Clinical Impression  Pt has a mild oral dysphagia with reduced sensation and right sided weakness resulting in reduced bolus cohesion, buccal pocketing, and premature spillage. He was not able to transit the barium tablet posteriorly despite use of liquid/pureed washes and SLP placement of pill back onto his tongue after it was pocketed. His pharyngeal phase occurs with good automaticity and no airway compromise. Suspect that coughing observed at bedside was a result of reduced control given larger bolus volumes, although this could not be replicated even with large consecutive sips. Recommend Dys 3 diet and thin liquids with full supervision for management of right sided sensorimotor changes. Meds would be best administered crushed in puree.   Swallow Evaluation Recommendations       SLP Diet Recommendations: Dysphagia 3 (Mech soft) solids;Thin liquid   Liquid Administration via: Cup;Straw   Medication Administration: Crushed with puree   Supervision: Patient able to self feed;Full supervision/cueing for compensatory strategies   Compensations: Slow rate;Small sips/bites;Lingual sweep for clearance of pocketing;Monitor for anterior loss   Postural Changes: Seated upright at 90 degrees   Oral Care Recommendations: Oral care BID        Maxcine Hamaiewonsky, Jarone Ostergaard 09/29/2016,12:36 PM   Maxcine HamLaura Paiewonsky, M.A. CCC-SLP [phone]

## 2016-09-29 NOTE — Progress Notes (Signed)
Physical Therapy Treatment Patient Details Name: Johnny Sullivan MRN: [id number] DOB: 08/08/1955 Today's Date: 09/29/2016    History of Present Illness Pt is a 61 yo male admitted to to the hospital with complaints of stroke symptoms including inability to talk and R sided weakness. CTA revealed left ICA occlusion and a short occluded segment of the proximal left MCA. tPA administered at [facility].     PT Comments    Patient progressing well with mobility. Tolerated stair training today with Min guard assist for safety. Pt still not verbalizing today but able to follow gestural cues very well. Balance seems close to baseline but did not challenge him much today. RR up to 50s and Sp02 down to 89% on RA. Encouraged ambulation with son (who is a PT) while in hospital. Pt needs cues to use right hand for functional tasks. Would really benefit from neuro outpatient OT/SLP but does not need PT services. Will follow for higher level balance challenges.    Follow Up Recommendations  No PT follow up;Supervision - Intermittent     Equipment Recommendations  None recommended by PT    Recommendations for Other Services       Precautions / Restrictions Precautions Precautions: Fall Restrictions Weight Bearing Restrictions: No    Mobility  Bed Mobility Overal bed mobility: Needs Assistance Bed Mobility: Supine to Sit     Supine to sit: Modified independent (Device/Increase time)     General bed mobility comments: No assist needed.   Transfers Overall transfer level: Needs assistance Equipment used: None Transfers: Sit to/from Stand Sit to Stand: Modified independent (Device/Increase time)         General transfer comment: Stood from EOB without difficulty or lOB.  Ambulation/Gait Ambulation/Gait assistance: Supervision Ambulation Distance (Feet): 250 Feet Assistive device: None Gait Pattern/deviations: Step-through pattern;Decreased stride  length Gait velocity: decreased Gait velocity interpretation: Below normal speed for age/gender General Gait Details: Slow, steady gait with mild antalgic deficits due to hip replacement in January; RR up to 50s, cues to breathe as pt tends to hold breath. Cues for arm swing.   Stairs Stairs: Yes   Stair Management: Step to pattern;One rail Left Number of Stairs: 2 General stair comments: Cues for techniqe.   Wheelchair Mobility    Modified Rankin (Stroke Patients Only) Modified Rankin (Stroke Patients Only) Pre-Morbid Rankin Score: No symptoms Modified Rankin: Moderate disability     Balance Overall balance assessment: Needs assistance Sitting-balance support: No upper extremity supported;Feet supported Sitting balance-Leahy Scale: Good     Standing balance support: During functional activity Standing balance-Leahy Scale: Good                              Cognition Arousal/Alertness: Awake/alert Behavior During Therapy: WFL for tasks assessed/performed Overall Cognitive Status: Difficult to assess                                 General Comments: Pt able to follow commands with gestures, but demonstrates difficulty with expressive language. Does well with contextual cues in environment when performing functional tasks/mobility.      Exercises      General Comments General comments (skin integrity, edema, etc.): Family present during session. VSS except RR increased for short time but recovered with standing rest break. Sp02 dropped to 89% on RA but resolved quickly.  Pertinent Vitals/Pain Pain Assessment: Faces Faces Pain Scale: No hurt    Home Living                      Prior Function            PT Goals (current goals can now be found in the care plan section) Progress towards PT goals: Progressing toward goals    Frequency    Min 4X/week      PT Plan Current plan remains appropriate    Co-evaluation               AM-PAC PT "6 Clicks" Daily Activity  Outcome Measure  Difficulty turning over in bed (including adjusting bedclothes, sheets and blankets)?: None Difficulty moving from lying on back to sitting on the side of the bed? : None Difficulty sitting down on and standing up from a chair with arms (e.g., wheelchair, bedside commode, etc,.)?: None Help needed moving to and from a bed to chair (including a wheelchair)?: None Help needed walking in hospital room?: A Little Help needed climbing 3-5 steps with a railing? : A Little 6 Click Score: 22    End of Session Equipment Utilized During Treatment: Gait belt Activity Tolerance: Patient tolerated treatment well Patient left: in chair;with call bell/phone within reach;with family/visitor present;with chair alarm set Nurse Communication: Mobility status PT Visit Diagnosis: Unsteadiness on feet (R26.81);Difficulty in walking, not elsewhere classified (R26.2);Hemiplegia and hemiparesis Hemiplegia - Right/Left: Right Hemiplegia - dominant/non-dominant: Dominant Hemiplegia - caused by: Cerebral infarction     Time: 1610-9604 PT Time Calculation (min) (ACUTE ONLY): 26 min  Charges:  $Gait Training: 23-37 mins                    G Codes:       Johnny Sullivan, PT, DPT [phone]     Johnny Sullivan 09/29/2016, 11:01 AM

## 2016-09-30 ENCOUNTER — Inpatient Hospital Stay (HOSPITAL_COMMUNITY): Payer: BLUE CROSS/BLUE SHIELD

## 2016-09-30 ENCOUNTER — Encounter (HOSPITAL_COMMUNITY)
Admission: EM | Disposition: A | Discharge status: Home / Self Care | Payer: Self-pay | Source: Home / Self Care | Attending: Neurology

## 2016-09-30 DIAGNOSIS — I639 Cerebral infarction, unspecified: Secondary | ICD-10-CM

## 2016-09-30 DIAGNOSIS — R4701 Aphasia: Secondary | ICD-10-CM

## 2016-09-30 DIAGNOSIS — I350 Nonrheumatic aortic (valve) stenosis: Secondary | ICD-10-CM

## 2016-09-30 DIAGNOSIS — R339 Retention of urine, unspecified: Secondary | ICD-10-CM

## 2016-09-30 HISTORY — PX: LOOP RECORDER INSERTION: EP1214

## 2016-09-30 HISTORY — PX: TEE WITHOUT CARDIOVERSION: SHX5443

## 2016-09-30 LAB — BASIC METABOLIC PANEL
ANION GAP: 10 (ref 5–15)
BUN: 14 mg/dL (ref 6–20)
CALCIUM: 8.9 mg/dL (ref 8.9–10.3)
CO2: 26 mmol/L (ref 22–32)
Chloride: 104 mmol/L (ref 101–111)
Creatinine, Ser: 0.75 mg/dL (ref 0.61–1.24)
GFR calc Af Amer: >60 mL/min (ref >60)
GFR calc non Af Amer: >60 mL/min (ref >60)
GLUCOSE: 108 mg/dL — AB (ref 65–99)
POTASSIUM: 3.9 mmol/L (ref 3.5–5.1)
Sodium: 140 mmol/L (ref 135–145)

## 2016-09-30 LAB — CBC
HEMATOCRIT: 35.4 % — AB (ref 39.0–52.0)
Hemoglobin: 11.2 g/dL — ABNORMAL LOW (ref 13.0–17.0)
MCH: 26.7 pg (ref 26.0–34.0)
MCHC: 31.6 g/dL (ref 30.0–36.0)
MCV: 84.3 fL (ref 78.0–100.0)
Platelets: 325 10*3/uL (ref 150–400)
RBC: 4.2 MIL/uL — ABNORMAL LOW (ref 4.22–5.81)
RDW: 14.8 % (ref 11.5–15.5)
WBC: 12.3 10*3/uL — ABNORMAL HIGH (ref 4.0–10.5)

## 2016-09-30 LAB — TRIGLYCERIDES: Triglycerides: 188 mg/dL — ABNORMAL HIGH (ref <150)

## 2016-09-30 SURGERY — LOOP RECORDER INSERTION
Anesthesia: LOCAL

## 2016-09-30 SURGERY — ECHOCARDIOGRAM, TRANSESOPHAGEAL
Anesthesia: Moderate Sedation

## 2016-09-30 MED ORDER — MIDAZOLAM HCL 5 MG/ML IJ SOLN
INTRAMUSCULAR | Status: AC
  Filled 2016-09-30: qty 2

## 2016-09-30 MED ORDER — LIDOCAINE HCL 1 % IJ SOLN
INTRAMUSCULAR | Status: AC
  Filled 2016-09-30: qty 20

## 2016-09-30 MED ORDER — FENTANYL CITRATE (PF) 100 MCG/2ML IJ SOLN
INTRAMUSCULAR | Status: DC | PRN
  Administered 2016-09-30: 25 ug via INTRAVENOUS

## 2016-09-30 MED ORDER — LIDOCAINE HCL (PF) 1 % IJ SOLN
INTRAMUSCULAR | Status: DC | PRN
  Administered 2016-09-30: 15 mL

## 2016-09-30 MED ORDER — BUTAMBEN-TETRACAINE-BENZOCAINE 2-2-14 % EX AERO
INHALATION_SPRAY | CUTANEOUS | Status: DC | PRN
  Administered 2016-09-30: 2 via TOPICAL

## 2016-09-30 MED ORDER — MIDAZOLAM HCL 10 MG/2ML IJ SOLN
INTRAMUSCULAR | Status: DC | PRN
  Administered 2016-09-30: 2 mg via INTRAVENOUS
  Administered 2016-09-30: 1 mg via INTRAVENOUS

## 2016-09-30 MED ORDER — FENTANYL CITRATE (PF) 100 MCG/2ML IJ SOLN
INTRAMUSCULAR | Status: AC
  Filled 2016-09-30: qty 2

## 2016-09-30 MED ORDER — DIPHENHYDRAMINE HCL 50 MG/ML IJ SOLN
INTRAMUSCULAR | Status: AC
  Filled 2016-09-30: qty 1

## 2016-09-30 SURGICAL SUPPLY — 2 items
LOOP REVEAL LINQSYS (Prosthesis & Implant Heart) ×2 IMPLANT
PACK LOOP INSERTION (CUSTOM PROCEDURE TRAY) ×2 IMPLANT

## 2016-09-30 NOTE — Interval H&P Note (Signed)
History and Physical Interval Note:  09/30/2016 2:37 PM  Read Johnny Sullivan  has presented today for surgery, with the diagnosis of STROKE  The various methods of treatment have been discussed with the patient and family. After consideration of risks, benefits and other options for treatment, the patient has consented to  Procedure(s): TRANSESOPHAGEAL ECHOCARDIOGRAM (TEE) (N/A) as a surgical intervention .  The patient's history has been reviewed, patient examined, no change in status, stable for surgery.  I have reviewed the patient's chart and labs.  Questions were answered to the patient's satisfaction.     Johnny Sullivan

## 2016-09-30 NOTE — H&P (View-Only) (Signed)
ELECTROPHYSIOLOGY CONSULT NOTE  Patient ID: Johnny Sullivan MRN: [id number], DOB/AGE: 1955-07-16   Admit date: 09/26/2016 Date of Consult: 09/30/2016  Primary Physician: System, Pcp Not In Primary Cardiologist: new to [facility] Reason for Consultation: Cryptogenic stroke; recommendations regarding Implantable Loop Recorder  History of Present Illness Johnny Sullivan seen at the request of Dr Roda Shutters for evaluation of ILR placement in the setting of cryptogenic stroke. He was admitted on 09/26/2016 with aphasia and right hemiplegia.  Imaging demonstrated left MCA infarct 2/2 left ICA and MCA occlusion felt to be embolic 2/2 unknown source.  He was treated with tPA and required transient intubation. He also has had suprapubic catheter placed.  He has undergone workup for stroke including echocardiogram and carotid dopplers.  The patient has been monitored on telemetry which has demonstrated sinus rhythm with no arrhythmias.  Inpatient stroke work-up is to be completed with a TEE.   Echocardiogram this admission demonstrated EF 50-55%, grade 1 diastolic dysfunction, LA 42.  Lab work is reviewed.  Prior to admission, the patient denies chest pain, shortness of breath, dizziness, palpitations, or syncope.  They are recovering from their stroke with plans to return home at discharge.  EP has been asked to evaluate for placement of an implantable loop recorder to monitor for atrial fibrillation.  Past Medical History:  Diagnosis Date  . Arthritis    Osteoarthritis- right hip  . Coronary artery disease   . GERD (gastroesophageal reflux disease)   . Hypertension   . Myocardial infarction (HCC)    " mild"-Dr. Abbey Chatters follows -gives clearance.  Marland Kitchen Urethral stricture    'uses self [place] caths weekly" to correct.     Surgical History:  Past Surgical History:  Procedure Laterality Date  . BACK SURGERY     lumbar discectomy  . CARDIAC CATHETERIZATION    . CORONARY ARTERY  BYPASS GRAFT     09-20-2012 x3 -[place]  . IR PERCUTANEOUS ART THROMBECTOMY/INFUSION INTRACRANIAL INC DIAG ANGIO  09/26/2016  . RADIOLOGY WITH ANESTHESIA N/A 09/26/2016   Procedure: RADIOLOGY WITH ANESTHESIA;  Surgeon: Julieanne Cotton, MD;  Location: [facility] OR;  Service: Radiology;  Laterality: N/A;  . TOTAL HIP ARTHROPLASTY Right 04/26/2016   Procedure: RIGHT TOTAL HIP ARTHROPLASTY ANTERIOR APPROACH;  Surgeon: Durene Romans, MD;  Location: [facility] ORS;  Service: Orthopedics;  Laterality: Right;  Marland Kitchen VASECTOMY    . WRIST SURGERY     "implant of plastic bone"-mild ROM limits     Prescriptions Prior to Admission  Medication Sig Dispense Refill Last Dose  . aspirin EC 81 MG tablet Take 81 mg by mouth daily.   09/26/2016 at Unknown time  . atorvastatin (LIPITOR) 80 MG tablet Take 80 mg by mouth daily at 6 PM.   3 09/25/2016 at Unknown time  . hydrochlorothiazide (HYDRODIURIL) 25 MG tablet Take 25 mg by mouth daily as needed (fluid retention).   3 PRN at Unknown time  . metoprolol tartrate (LOPRESSOR) 25 MG tablet Take 25 mg by mouth 2 (two) times daily.   3 09/26/2016 at 0730  . ranitidine (ZANTAC) 150 MG capsule Take 150 mg by mouth daily as needed for heartburn.    Past Week at Unknown time  . amoxicillin (AMOXIL) 500 MG capsule Take 2,000 mg by mouth See admin instructions. ONE HOUR PRIOR TO DENTAL APPOINTMENT  0 Not taken recently    Inpatient Medications:  . aspirin  325 mg Oral Daily   Or  . aspirin  300 mg Rectal Daily  . atorvastatin  80 mg Oral q1800  . folic acid  1 mg Intravenous Daily  . mouth rinse  15 mL Mouth Rinse BID  . pantoprazole (PROTONIX) IV  40 mg Intravenous Q12H  . thiamine injection  100 mg Intravenous Daily    Allergies:  Allergies  Allergen Reactions  . Alteplase Swelling and Other (See Comments)    Angioedema   . Vancomycin Other (See Comments)    Arletha Grippe syndrome    Social History   Social History  . Marital status: Married    Spouse name: N/A  . Number of  children: N/A  . Years of education: N/A   Occupational History  . Not on file.   Social History Main Topics  . Smoking status: Former Smoker    Packs/day: 1.50    Years: 15.00    Types: Cigarettes    Quit date: 04/20/2012  . Smokeless tobacco: Former Neurosurgeon    Types: Chew    Quit date: 04/20/2012  . Alcohol use Yes     Comment: occ. use  . Drug use: No  . Sexual activity: Yes   Other Topics Concern  . Not on file   Social History Narrative  . No narrative on file     Family History: no history of stroke     Review of Systems: All other systems reviewed and are otherwise negative except as noted above.  Physical Exam: Vitals:   09/29/16 1900 09/29/16 1946 09/30/16 0124 09/30/16 0501  BP: (!) 155/68 (!) 163/64 (!) 133/51 133/61  Pulse:  73 65 76  Resp: 16 20 20 20   Temp:  97.7 F (36.5 C) 98.2 F (36.8 C) 98.2 F (36.8 C)  TempSrc:  Oral Oral Oral  SpO2:  98% 95% 95%  Weight:      Height:        GEN- The patient is well appearing, +expressive aphasia, but appears to understand Head- normocephalic, atraumatic Eyes-  Sclera clear, conjunctiva pink Ears- hearing intact Oropharynx- clear Neck- supple Lungs- normal work of breathing Heart- Regular rate and rhythm  GI- soft, NT, ND, + BS Extremities- no clubbing, cyanosis, or edema MS- no significant deformity or atrophy Skin- no rash or lesion Psych- +aphasia   Labs:   Lab Results  Component Value Date   WBC 12.3 (H) 09/30/2016   HGB 11.2 (L) 09/30/2016   HCT 35.4 (L) 09/30/2016   MCV 84.3 09/30/2016   PLT 325 09/30/2016    Recent Labs Lab 09/26/16 1851  09/30/16 0522  NA 142  < > 140  K 3.6  < > 3.9  CL 107  < > 104  CO2 23  < > 26  BUN 18  < > 14  CREATININE 1.15  < > 0.75  CALCIUM 9.7  < > 8.9  PROT 8.0  --   --   BILITOT 0.5  --   --   ALKPHOS 95  --   --   ALT 28  --   --   AST 34  --   --   GLUCOSE 132*  < > 108*  < > = values in this interval not displayed. No results found  for: CKTOTAL, CKMB, CKMBINDEX, TROPONINI   Radiology/Studies:  Mr Brain Wo Contrast Result Date: 09/27/2016 CLINICAL DATA:  Follow-up LEFT internal carotid artery occlusion and LEFT MCA stroke. Status post endovascular revascularization LEFT internal carotid artery, LEFT MCA and LEFT ACA. EXAM: MRI HEAD WITHOUT CONTRAST MRA HEAD WITHOUT CONTRAST TECHNIQUE: Multiplanar, multiecho pulse  sequences of the brain and surrounding structures were obtained without intravenous contrast. Angiographic images of the head were obtained using MRA technique without contrast. COMPARISON:  CT and CTA  HEAD Sep 26, 2016. FINDINGS: MRI HEAD FINDINGS BRAIN: Patchy to confluent reduced diffusion LEFT frontoparietal and temporal lobes, to lesser extent LEFT basal ganglia with low ADC values. Punctate areas of susceptibility artifact LEFT frontal lobe, LEFT insula, some which correspond to density on prior CT. No lobar hematoma. Regional LEFT cerebrum mass effect without significant midline shift. No hydrocephalus. A few scattered supratentorial subcentimeter white matter FLAIR T2 hyperintensities exclusive a aforementioned abnormality compatible with mild chronic small vessel ischemic disease. VASCULAR: Normal major intracranial vascular flow voids present at skull base. SKULL AND UPPER CERVICAL SPINE: No abnormal sellar expansion. No suspicious calvarial bone marrow signal. Craniocervical junction maintained. SINUSES/ORBITS: The mastoid air-cells and included paranasal sinuses are well-aerated. The included ocular globes and orbital contents are non-suspicious. OTHER: None. MRA HEAD FINDINGS ANTERIOR CIRCULATION: Flow related enhancement of the included cervical, petrous, cavernous and supraclinoid internal carotid arteries. Pulsation artifact limits assessment of the carotid terminus bilaterally. Patent anterior communicating artery. Developmentally smaller RIGHT anterior cerebral artery. Severe stenosis LEFT M2 versus M3 origin  given early bifurcation. No large vessel occlusion, abnormal luminal irregularity, aneurysm. POSTERIOR CIRCULATION: LEFT vertebral artery is dominant. Basilar artery is patent, with normal flow related enhancement of the main branch vessels. Normal flow related enhancement of the posterior cerebral arteries. Small LEFT and robust RIGHT posterior communicating artery is present. No large vessel occlusion, high-grade stenosis, abnormal luminal irregularity, aneurysm. ANATOMIC VARIANTS: None. Source images and MIP images were reviewed. IMPRESSION: MRI HEAD: Acute large LEFT MCA territory infarct with petechial hemorrhage. Mild chronic small vessel ischemic disease. MRA HEAD: Successful re- vascularization of LEFT internal carotid artery. Severe stenosis LEFT M2 vs. M3 origin, possible residual thromboembolism. Electronically Signed   By: Awilda Metroourtnay  Bloomer M.D.   On: 09/27/2016 20:14   12-lead ECG sinus tach, rate 105 All prior EKG's in EPIC reviewed with no documented atrial fibrillation  Telemetry sinus rhythm, rare PVC's  Assessment and Plan:  1. Cryptogenic stroke The patient presents with cryptogenic stroke.  The patient has a TEE planned for later today.  I spoke at length with the patient about monitoring for afib with an implantable loop recorder.  Risks, benefits, and alteratives to implantable loop recorder were discussed with the patient today.   At this time, the patient is very clear in their decision to proceed with implantable loop recorder.   Wound care was reviewed with the patient (keep incision clean and dry for 3 days).     Please call with questions.   Gypsy BalsamAmber Seiler, NP 09/30/2016 9:45 AM  I have seen, examined the patient, and reviewed the above assessment and plan.  On exam, RRR.  Changes to above are made where necessary.  Risks of ILR discussed with the patient and family who wishes to proceed.  Remote monitoring also discussed today.  Co Sign: Hillis RangeJames Vuk Skillern,  MD 09/30/2016 11:24 AM

## 2016-09-30 NOTE — Progress Notes (Signed)
Patient ID: Johnny Sullivan, male   DOB: 07/27/1955, 61 y.o.   MRN: [id number]  I spoke to Johnny Sullivan's wife and they would like for him to have a voiding trial since he may be going home tomorrow.  He was voiding ok prior to admission but did CIC to dilate weekly.  I have placed the order to clamp the tube and check PVR's.    He will call for f/u with us for 5-7 days for SP tube removal if he is voiding well.

## 2016-09-30 NOTE — CV Procedure (Signed)
Procedure: TEE  Indication: CVA  Sedation: Versed 3 mg IV, Fentanyl 25 mcg IV  Findings: Please see echo section for full report.  Normal LV size with mild LV hypertrophy.  EF 50-55% with apical dyskinesis.  Smoke at the apex but no thrombus visualized.  Normal RV size and systolic function.  Mild left atrial enlargement with no LA appendage thrombus.  Normal right atrium.  No significant TR.  Trileaflet aortic valve with moderate calcification.  Mild aortic stenosis, mild aortic insufficiency.  Mild mitral regurgitation.  No PFO/ASD, negative bubble study.  Normal caliber thoracic aorta with grade III plaque in the descending thoracic aorta.   No source of embolus.   Johnny AnconaDalton [name] 09/30/2016 2:50 PM

## 2016-09-30 NOTE — Progress Notes (Signed)
STROKE TEAM PROGRESS NOTE   SUBJECTIVE (INTERVAL HISTORY) His wife is at the bedside.  Pt is pending TEE and loop today. Urology on board and clamped SP catheter and give trial for urination. Plan for d/c in am.      OBJECTIVE Temp:  [97.7 F (36.5 C)-98.2 F (36.8 C)] 98.2 F (36.8 C) (05/25 0501) Pulse Rate:  [53-80] 76 (05/25 0501) Cardiac Rhythm: Normal sinus rhythm (05/24 1900) Resp:  [9-35] 20 (05/25 0501) BP: (133-164)/(51-92) 133/61 (05/25 0501) SpO2:  [93 %-100 %] 95 % (05/25 0501)  CBC:  Recent Labs Lab 09/26/16 1851  09/27/16 0210  09/29/16 0208 09/30/16 0522  WBC 11.4*  --  12.0*  < > 12.0* 12.3*  NEUTROABS 7.8*  --  11.4*  --   --   --   HGB 13.0  < > 10.8*  < > 9.6* 11.2*  HCT 39.3  < > 34.2*  < > 31.1* 35.4*  MCV 83.6  --  84.7  < > 88.1 84.3  PLT 324  --  310  < > 296 325  < > = values in this interval not displayed.  Basic Metabolic Panel:   Recent Labs Lab 09/27/16 0210  09/29/16 0208 09/30/16 0522  NA 141  < > 142 140  K 4.1  < > 4.0 3.9  CL 109  < > 109 104  CO2 22  < > 28 26  GLUCOSE 171*  < > 103* 108*  BUN 15  < > 20 14  CREATININE 0.87  < > 0.78 0.75  CALCIUM 8.3*  < > 8.0* 8.9  MG 2.2  --   --   --   PHOS 4.3  --   --   --   < > = values in this interval not displayed.  Lipid Panel:     Component Value Date/Time   CHOL 108 09/27/2016 0210   TRIG 188 (H) 09/30/2016 0522   HDL 33 (L) 09/27/2016 0210   CHOLHDL 3.3 09/27/2016 0210   VLDL 17 09/27/2016 0210   LDLCALC 58 09/27/2016 0210   HgbA1c:  Lab Results  Component Value Date   HGBA1C 6.3 (H) 09/27/2016   Urine Drug Screen:     Component Value Date/Time   LABOPIA NONE DETECTED 09/27/2016 0630   COCAINSCRNUR NONE DETECTED 09/27/2016 0630   LABBENZ NONE DETECTED 09/27/2016 0630   AMPHETMU NONE DETECTED 09/27/2016 0630   THCU NONE DETECTED 09/27/2016 0630   LABBARB NONE DETECTED 09/27/2016 0630    Alcohol Level     Component Value Date/Time   ETH <5 09/26/2016 1851     IMAGING I have personally reviewed the radiological images below and agree with the radiology interpretations.  Ct Angio Head W Or Wo Contrast Ct Angio Neck W And/or Wo Contrast 09/26/2016 CTA NECK:   Acute LEFT internal carotid artery occlusion. Atherosclerosis without hemodynamically significant stenosis. Multilevel severe neural foraminal narrowing. CTA HEAD:  Occluded LEFT ICA with reconstitution LEFT carotid terminus. Occluded proximal LEFT M1 segment with thready collateralization. Diminutive RIGHT anterior cerebral artery favoring normal variant.   Ct Head Wo Contrast 09/26/2016 1. Evolving acute left MCA territory infarct as above. Superimposed scattered serpiginous hyperdensity most likely reflects contrast staining from recent endovascular revascularization procedure. Superimposed hemorrhage not entirely excluded. Attention at follow-up recommended.  2. Otherwise stable appearance of the brain.   Ct Head Wo Contrast 09/26/2016 1. Emergent large vessel occlusion affecting the LEFT ICA terminus and proximal LEFT MCA.  2. ASPECTS  is 8, early hypoattenuation of the LEFT insula and LEFT anterolateral temporal cortex.   Cerebral angiogram S/P Lt common carotid arteriogram followed by endovascular revascularization of occluded LT ICA terminus,Lt MCA and Lt ACA with x 2 passes with Solitaire  FR 4mmx 40 MM retrieval device achieving a TICI 3 reperfusion. Groin puncture to TICI 2b  26 mins, and to TICI 3 44mns.  Ct Head Wo Contrast  09/28/2016 IMPRESSION: 1. Continued expected evolution of left MCA infarct without new acute finding. 2. Trace residual hyperdensity in this area is favored to indicate contrast staining. Continued attention on follow-up is recommended to exclude hemorrhage.   Mri and Mra Brain Wo Contrast 09/27/2016 IMPRESSION: MRI HEAD: Acute large LEFT MCA territory infarct with petechial hemorrhage. Mild chronic small vessel ischemic disease. MRA HEAD: Successful re-  vascularization of LEFT internal carotid artery. Severe stenosis LEFT M2 vs. M3 origin, possible residual thromboembolism.   TTE - Left ventricle: The cavity size was normal. There was mild   concentric hypertrophy. Systolic function was normal. The   estimated ejection fraction was in the range of 50% to 55%.   Dyskinesis of the apical myocardium; consistent with infarction   in the distribution of the left anterior descending coronary   artery. Doppler parameters are consistent with abnormal left   ventricular relaxation (grade 1 diastolic dysfunction). Doppler   parameters are consistent with elevated mean left atrial filling   pressure. Acoustic contrast opacification revealed no evidence   ofthrombus. Although there is no visible thrombus, there is very   slow, swirling flow in the dyskinetic apex. - Aortic valve: Noncoronary cusp mobility was severely restricted.   There was mild regurgitation. - Mitral valve: Calcified annulus. - Left atrium: The atrium was mildly dilated.  LE venous doppler pending  TEE - Normal LV size with mild LV hypertrophy.  EF 50-55% with apical dyskinesis.  Smoke at the apex but no thrombus visualized.  Normal RV size and systolic function.  Mild left atrial enlargement with no LA appendage thrombus.  Normal right atrium.  No significant TR.  Trileaflet aortic valve with moderate calcification.  Mild aortic stenosis, mild aortic insufficiency.  Mild mitral regurgitation.  No PFO/ASD, negative bubble study.  Normal caliber thoracic aorta with grade III plaque in the descending thoracic aorta.    PHYSICAL EXAM  Temp:  [97.7 F (36.5 C)-98.2 F (36.8 C)] 98.2 F (36.8 C) (05/25 0501) Pulse Rate:  [53-80] 76 (05/25 0501) Resp:  [9-35] 20 (05/25 0501) BP: (133-164)/(51-92) 133/61 (05/25 0501) SpO2:  [93 %-100 %] 95 % (05/25 0501)  General - Well nourished, well developed, not in acute distress.  Ophthalmologic - Fundi not visualized due to  noncooperation.  Cardiovascular - Regular rate and rhythm.  Neuro - awake alert, still global aphasia, only able to close eyes on command, but not able to follow other commands but able to pantomime. Not able to name or repeat. PERRL, eyes attend both sides, blinking to bilateral visual threat. No significant facial asymmetry. Pt did not open mouth on request. LUE and LLE and RLE spontaneous movement with normal strength. RUE 4/5 proximal but not following commands for wrist or finger movement. DTR 1+ and no babinski. Sensation, coordination and gag not tested.     ASSESSMENT/PLAN Mr. Read Drivershillip Foote is a 61 y.o. male with history of CAD with previous MI s/p CABG in 2014, hypertension, ureteral stricture and GERD presenting with aphasia and right hemiplegia. The patient received IV TPA on Monday, 09/26/2016 at  1915 hrs.  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 Superimposed small hemorrhagic transformation  CTA H&N - Acute LEFT ICA occlusion. Occluded proximal LEFT M1.   Cerebral angio - TICI3 of left ICA and left MCA as well as left ACA  MRI head - Acute large LEFT MCA territory infarct with petechial hemorrhage  MRA head -  Left brain luxury perfusion  2D Echo - EF 50-55%  LE venous doppler - pending  TEE unremarkable   Loop placed.  LDL - 58  HgbA1c 6.3  VTE prophylaxis - lovenox Diet NPO time specified  aspirin 81 mg daily prior to admission, now on ASA 325mg  daily. Continue ASA on discharge.  Patient counseled to be compliant with his antithrombotic medications  Ongoing aggressive stroke risk factor management  Therapy recommendations: Outpatient OT and speech  Disposition: Pending  Left ICA occlusion   S/p thrombectomy  Concerning for embolic event  Lack of  traditional stroke risk factors  TEE negative  Loop placed  Hypertension  Stable  Long-term BP goal normotensive  Hyperlipidemia  Home meds: Lipitor 80 mg daily prior to admission  LDL 58, goal < 70  Lipitor resumed in hospital.  Continue statin at discharge  Urethral stricture  Urinary retention  Urology following  S/p suprapubic catheter - 09/27/2016 Dr Annabell Howells  Dr Annabell Howells spoke with family - Voiding trial prior to discharge  F/U Dr Annabell Howells after discharge  Alcohol use  Average daily 2-3 drinks  On B1/FA/MVI  CIWA protocol  Seizure precaution  Other Stroke Risk Factors  Advanced age  Obesity, Body mass index is 34.93 kg/m., recommend weight loss, diet and exercise as appropriate   CAD / MI s/p CABG in 2014  Other Active Problems  Mild leukocytosis - 12.0 ->12.3 (afebrile)  Hospital day # 4  Marvel Plan, MD PhD Stroke Neurology 09/30/2016 6:09 PM   To contact Stroke Continuity provider, please refer to [email or web address]. After hours, contact General Neurology

## 2016-09-30 NOTE — Discharge Summary (Signed)
Stroke Discharge Summary  Patient ID: Johnny Sullivan    l   MRN: [id number]      DOB: 03/06/1956  Date of Admission: 09/26/2016 Date of Discharge: 10/01/2016  Attending Physician:  Marvel PlanXu, Jindong, MD, Stroke MD Consultant(s):   Treatment Team:  Bjorn PippinWrenn, [name], MD pulmonary/intensive care Dr Tyson AliasFeinstein Patient's PCP:  System, Pcp Not In  DISCHARGE DIAGNOSIS: Acute left MCA territory infarct treated with TPA and thrombectomy. Active Problems:   Cerebrovascular accident (CVA) (HCC)   Cerebrovascular accident (CVA) due to thrombosis of precerebral artery (HCC)   Acute embolic stroke (HCC)  BMI: Body mass index is 31.71 kg/m.  Past Medical History:  Diagnosis Date  . Arthritis    Osteoarthritis- right hip  . Coronary artery disease   . GERD (gastroesophageal reflux disease)   . Hypertension   . Myocardial infarction (HCC)    " mild"-Dr. Abbey ChattersZakhary,cardiolgy-[place] follows -gives clearance.  Marland Kitchen. Urethral stricture    'uses self [place] cath[place] weekly" to correct.   Past Surgical History:  Procedure Laterality Date  . BACK SURGERY     lumbar discectomy  . CARDIAC CATHETERIZATION    . CORONARY ARTERY BYPASS GRAFT     09-20-2012 x3 -[place]  . IR PERCUTANEOUS ART THROMBECTOMY/INFUSION INTRACRANIAL INC DIAG ANGIO  09/26/2016  . RADIOLOGY WITH ANESTHESIA N/A 09/26/2016   Procedure: RADIOLOGY WITH ANESTHESIA;  Surgeon: Julieanne Cottoneveshwar, Sanjeev, MD;  Location: [facility] OR;  Service: Radiology;  Laterality: N/A;  . TOTAL HIP ARTHROPLASTY Right 04/26/2016   Procedure: RIGHT TOTAL HIP ARTHROPLASTY ANTERIOR APPROACH;  Surgeon: Durene RomansMatthew Olin, MD;  Location: [facility];  Service: Orthopedics;  Laterality: Right;  Marland Kitchen. VASECTOMY    . WRIST SURGERY     "implant of plastic bone"-mild ROM limits    Allergies as of 10/01/2016      Reactions   Alteplase Swelling, Other (See Comments)   Angioedema    Vancomycin Other (See Comments)   Redman syndrome      Medication List    STOP taking these medications   aspirin  EC 81 MG tablet Replaced by:  aspirin 325 MG tablet   cephALEXin 500 MG capsule Commonly known as:  KEFLEX   hydrochlorothiazide 25 MG tablet Commonly known as:  HYDRODIURIL   HYDROcodone-acetaminophen 7.5-325 MG tablet Commonly known as:  NORCO   HYDROmorphone 2 MG tablet Commonly known as:  DILAUDID   methocarbamol 500 MG tablet Commonly known as:  ROBAXIN     TAKE these medications   amoxicillin 500 MG capsule Commonly known as:  AMOXIL Take 2,000 mg by mouth See admin instructions. ONE HOUR PRIOR TO DENTAL APPOINTMENT   aspirin 325 MG tablet Take 1 tablet (325 mg total) by mouth daily. Start taking on:  10/02/2016 Replaces:  aspirin EC 81 MG tablet   atorvastatin 80 MG tablet Commonly known as:  LIPITOR Take 80 mg by mouth daily at 6 PM.   folic acid 1 MG tablet Commonly known as:  FOLVITE Take 1 tablet (1 mg total) by mouth daily. Start taking on:  10/02/2016   metoprolol tartrate 25 MG tablet Commonly known as:  LOPRESSOR Take 25 mg by mouth 2 (two) times daily.   ranitidine 150 MG capsule Commonly known as:  ZANTAC Take 150 mg by mouth daily as needed for heartburn.   thiamine 100 MG tablet Take 1 tablet (100 mg total) by mouth daily. Start taking on:  10/02/2016       LABORATORY STUDIES CBC    Component Value Date/Time   WBC  12.3 (H) 09/30/2016 0522   RBC 4.20 (L) 09/30/2016 0522   HGB 11.2 (L) 09/30/2016 0522   HCT 35.4 (L) 09/30/2016 0522   PLT 325 09/30/2016 0522   MCV 84.3 09/30/2016 0522   MCH 26.7 09/30/2016 0522   MCHC 31.6 09/30/2016 0522   RDW 14.8 09/30/2016 0522   LYMPHSABS 0.5 (L) 09/27/2016 0210   MONOABS 0.0 (L) 09/27/2016 0210   EOSABS 0.0 09/27/2016 0210   BASOSABS 0.0 09/27/2016 0210   CMP    Component Value Date/Time   NA 140 09/30/2016 0522   K 3.9 09/30/2016 0522   CL 104 09/30/2016 0522   CO2 26 09/30/2016 0522   GLUCOSE 108 (H) 09/30/2016 0522   BUN 14 09/30/2016 0522   CREATININE 0.75 09/30/2016 0522    CALCIUM 8.9 09/30/2016 0522   PROT 8.0 09/26/2016 1851   ALBUMIN 4.5 09/26/2016 1851   AST 34 09/26/2016 1851   ALT 28 09/26/2016 1851   ALKPHOS 95 09/26/2016 1851   BILITOT 0.5 09/26/2016 1851   GFRNONAA >60 09/30/2016 0522   GFRAA >60 09/30/2016 0522   COAGS Lab Results  Component Value Date   INR 0.98 09/26/2016   Lipid Panel    Component Value Date/Time   CHOL 108 09/27/2016 0210   TRIG 188 (H) 09/30/2016 0522   HDL 33 (L) 09/27/2016 0210   CHOLHDL 3.3 09/27/2016 0210   VLDL 17 09/27/2016 0210   LDLCALC 58 09/27/2016 0210   HgbA1C  Lab Results  Component Value Date   HGBA1C 6.3 (H) 09/27/2016   Urinalysis    Component Value Date/Time   COLORURINE YELLOW 09/27/2016 0623   APPEARANCEUR CLOUDY (A) 09/27/2016 0623   LABSPEC >1.046 (H) 09/27/2016 0623   PHURINE 5.0 09/27/2016 0623   GLUCOSEU NEGATIVE 09/27/2016 0623   HGBUR SMALL (A) 09/27/2016 0623   BILIRUBINUR NEGATIVE 09/27/2016 0623   KETONESUR 5 (A) 09/27/2016 0623   PROTEINUR NEGATIVE 09/27/2016 0623   NITRITE NEGATIVE 09/27/2016 0623   LEUKOCYTESUR NEGATIVE 09/27/2016 0623   Urine Drug Screen     Component Value Date/Time   LABOPIA NONE DETECTED 09/27/2016 0630   COCAINSCRNUR NONE DETECTED 09/27/2016 0630   LABBENZ NONE DETECTED 09/27/2016 0630   AMPHETMU NONE DETECTED 09/27/2016 0630   THCU NONE DETECTED 09/27/2016 0630   LABBARB NONE DETECTED 09/27/2016 0630    Alcohol Level    Component Value Date/Time   ETH <5 09/26/2016 1851     SIGNIFICANT DIAGNOSTIC STUDIES   Ct Angio Head W Or Wo Contrast Ct Angio Neck W And/or Wo Contrast 09/26/2016 CTA NECK:   Acute LEFT internal carotid artery occlusion. Atherosclerosis without hemodynamically significant stenosis. Multilevel severe neural foraminal narrowing. CTA HEAD:  Occluded LEFT ICA with reconstitution LEFT carotid terminus. Occluded proximal LEFT M1 segment with thready collateralization. Diminutive RIGHT anterior cerebral artery favoring  normal variant.   Ct Head Wo Contrast 09/26/2016 1. Evolving acute left MCA territory infarct as above. Superimposed scattered serpiginous hyperdensity most likely reflects contrast staining from recent endovascular revascularization procedure. Superimposed hemorrhage not entirely excluded. Attention at follow-up recommended.  2. Otherwise stable appearance of the brain.   Ct Head Wo Contrast 09/26/2016 1. Emergent large vessel occlusion affecting the LEFT ICA terminus and proximal LEFT MCA.  2. ASPECTS is 8, early hypoattenuation of the LEFT insula and LEFT anterolateral temporal cortex.   Cerebral angiogram S/P Lt common carotid arteriogram followed by endovascular revascularization of occluded LT ICA terminus,Lt MCA and Lt ACA with x 2 passes with Solitaire  FR 40 MM retrieval device achieving a TICI 3 reperfusion. Groin puncture to TICI 2b 26 mins, and to TICI 3 .  Ct Head Wo Contrast  09/28/2016 IMPRESSION: 1. Continued expected evolution of left MCA infarct without new acute finding. 2. Trace residual hyperdensity in this area is favored to indicate contrast staining. Continued attention on follow-up is recommended to exclude hemorrhage.   Mri and Mra Brain Wo Contrast 09/27/2016 IMPRESSION: MRI HEAD: Acute large LEFT MCA territory infarct with petechial hemorrhage. Mild chronic small vessel ischemic disease. MRA HEAD: Successful re- vascularization of LEFT internal carotid artery. Severe stenosis LEFT M2 vs. M3 origin, possible residual thromboembolism.   TTE - Left ventricle: The cavity size was normal. There was mild concentric hypertrophy. Systolic function was normal. The estimated ejection fraction was in the range of 50% to 55%. Dyskinesis of the apical myocardium; consistent with infarction in the distribution of the left anterior descending coronary artery. Doppler parameters are consistent with abnormal left ventricular relaxation (grade 1  diastolic dysfunction). Doppler parameters are consistent with elevated mean left atrial filling pressure. Acoustic contrast opacification revealed no evidence ofthrombus. Although there is no visible thrombus, there is very slow, swirling flow in the dyskinetic apex. - Aortic valve: Noncoronary cusp mobility was severely restricted. There was mild regurgitation. - Mitral valve: Calcified annulus. - Left atrium: The atrium was mildly dilated.  LE - There is no DVT or SVT noted in the bilateral lower extremities.   TEE  09/30/2016 Please see echo section for full report.  Normal LV size with mild LV hypertrophy.  EF 50-55% with apical dyskinesis.  Smoke at the apex but no thrombus visualized.  Normal RV size and systolic function.  Mild left atrial enlargement with no LA appendage thrombus.  Normal right atrium.  No significant TR.  Trileaflet aortic valve with moderate calcification.  Mild aortic stenosis, mild aortic insufficiency.  Mild mitral regurgitation.  No PFO/ASD, negative bubble study.  Normal caliber thoracic aorta with grade III plaque in the descending thoracic aorta.      HISTORY OF PRESENT ILLNESS Johnny Sullivan is an 61 y.o. male who presented to [facility] with stroke symptoms. LKN was 1815 this evening. 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 When found he was unable to talk. Per [facility] ED physician, the patient was aphasic with flaccid right sided weakness.   CT head revealed an emergent large vessel occlusion affecting the left ICA terminus and proximal left MCA with associated early hypoattenuation of the left insula and left anterolateral temporal cortex. ASPECTS was 8.  Teleneurology consultation was obtained and IV tPA was recommended, followed by a STAT CTA head and emergent transfer to [facility] for possible catheter based clot retrieval.   CTA completed at 8:09 PM, revealing a left ICA  occlusion extending from just distal to the carotid bifurcation to just proximal to the carotid terminus. A short occluded segment of the proximal left MCA is also noted (preliminary interpretation by neurologist).   EKG at OSH showed no atrial fibrillation.   Per his wife, he takes daily aspirin and is on Lipitor 80 mg po qd. Formerly was on DAPT with Plavix and ASA for 2 years following previous CABG surgery.   LSN: [id number] tPA Given: Yes, at outside hospital  Past Medical History:  Diagnosis Date  . Arthritis    Osteoarthritis- right hip  . Coronary artery disease   . GERD (  gastroesophageal reflux disease)   . Hypertension   . Myocardial infarction (HCC)    " mild"-Dr. Abbey Chatters follows -gives clearance.  Marland Kitchen Urethral stricture    'uses self [place] caths weekly" to correct.         Past Surgical History:  Procedure Laterality Date  . BACK SURGERY     lumbar discectomy  . CARDIAC CATHETERIZATION    . CORONARY ARTERY BYPASS GRAFT     09-20-2012 x3 -[place]  . TOTAL HIP ARTHROPLASTY Right 04/26/2016   Procedure: RIGHT TOTAL HIP ARTHROPLASTY ANTERIOR APPROACH;  Surgeon: Durene Romans, MD;  Location: [facility];  Service: Orthopedics;  Laterality: Right;  Marland Kitchen VASECTOMY    . WRIST SURGERY     "implant of plastic bone"-mild ROM limits     HOSPITAL COURSE [name] is a 61 y.o. male with history of CAD with previous MI s/p CABG in 2014, hypertension, ureteral stricture and GERD presenting with aphasia and right hemiplegia.  The patient received IV TPA on Monday, 09/26/2016 at 1915 hrs. Cerebral angio - TICI3 of left ICA and left MCA as well as left ACA. The patient was then admitted to the neuro intensive care unit.   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 Superimposed small hemorrhagic transformation  CTA H&N - Acute LEFT ICA occlusion. Occluded proximal LEFT M1.   Cerebral angio - TICI3 of left ICA and left MCA as well as left ACA  MRI head - Acute large LEFT MCA territory infarct with petechial hemorrhage  MRA head -  Left brain luxury perfusion  2D Echo - EF 50-55%  LE venous doppler - Negative for DVT  TEE and loop - Friday 09/30/2016  LDL - 58  HgbA1c 6.3  VTE prophylaxis - lovenox  Diet NPO time specified  aspirin 81 mg daily prior to admission, now on ASA 325mg  or ASA PR 300mg  daily.  Patient counseled to be compliant with his antithrombotic medications  Ongoing aggressive stroke risk factor management  Therapy recommendations: Outpatient OT. No f/u PT.  Disposition: Pending  Left ICA occlusion   S/p thrombectomy  Concerning for embolic event  Lack of traditional stroke risk factors  Loop placed 09/30/2016  TEE - 09/30/2016 - see above  Hypertension  Stable              BP goal 120-160 after procedure              Long-term BP goal normotensive  Hyperlipidemia  Home meds: Lipitor 80 mg daily prior to admission  LDL 58, goal < 70  lipitor resumed in hospital.  Continue statin at discharge  Urethral stricture  Urinary retention  Urology following  S/p suprapubic catheter - 09/27/2016 Dr Annabell Howells  Voiding trial prior to discharge  Follow-up with [name] in one week   Alcohol use  Average daily 2-3 drinks  On B1/FA/MVI  CIWA protocol  Seizure precaution  Other Stroke Risk Factors  Advanced age  Obesity, Body mass index is 34.93 kg/m., recommend weight loss, diet and exercise as appropriate   CAD / MI s/p CABG in 2014  Other Active Problems  Mild leukocytosis - 12.0 ->12.3 (afebrile)  Mild anemia - improving  DISCHARGE EXAM Blood pressure (!) 149/69, pulse 87, temperature 98.6 F (37 C), temperature source Oral, resp.  rate 16,  height 5\' 9"  (1.753 m), weight 97.4 kg (214 lb 11.2 oz), SpO2 94 %. General - Well nourished, well developed, not in acute distress.  Ophthalmologic - Fundi not visualized due to noncooperation.  Cardiovascular - Regular rate and rhythm.  Neuro - awake alert, still global aphasia, only able to close eyes on command, but not able to follow other commands but able to pantomime. Not able to name or repeat. PERRL, eyes attend both sides, blinking to bilateral visual threat. No significant facial asymmetry. Pt did not open mouth on request. LUE and LLE and RLEspontaneous movement with normal strength. RUE 4/5 proximal but not following commands for wrist or finger movement. DTR 1+ and no babinski. Sensation, coordination and gag not tested.   Discharge Diet   DIET DYS 3 Room service appropriate? Yes; Fluid consistency: Thin liquids  Discharge instructions to the patient: 1. Increase activity gradually as tolerated 2. Outpatient therapy as instructed. 3. Use suprapubic catheter as instructed until seen by [name] 4. Find a primary care physician for regular follow-up.  DISCHARGE PLAN  Disposition:  Discharge to home.  aspirin 325 mg daily for secondary stroke prevention.  Ongoing risk factor control by Primary Care Physician at time of discharge  Follow-up with Darrol Angel NP [facility] in 6 weeks, office to schedule an appointment.  Follow-up Dr Annabell Howells within one week.  Follow-up with cardiology as instructed.  40 minutes were spent preparing discharge.  Delton See PA-C [facility] Pager [phone] 10/01/2016, 1:19 PM

## 2016-09-30 NOTE — Progress Notes (Signed)
SLP Cancellation Note  Patient Details Name: Read Drivershillip Dimascio MRN: [id number] DOB: 04/09/1956   Cancelled treatment:       Reason Eval/Treat Not Completed: Patient at procedure or test/unavailable. Pt back from endo but leaving for cath lab per RN. Will f/u as able.   Maxcine Hamaiewonsky, Colten Desroches 09/30/2016, 4:06 PM  Maxcine HamLaura Paiewonsky, M.A. CCC-SLP [phone]

## 2016-09-30 NOTE — Interval H&P Note (Signed)
History and Physical Interval Note:  09/30/2016 4:30 PM  Johnny Sullivan  has presented today for surgery, with the diagnosis of cva  The various methods of treatment have been discussed with the patient and family. After consideration of risks, benefits and other options for treatment, the patient has consented to  Procedure(s): Loop Recorder Insertion (N/A) as a surgical intervention .  The patient's history has been reviewed, patient examined, no change in status, stable for surgery.  I have reviewed the patient's chart and labs.  Questions were answered to the patient's satisfaction.     Johnny Sullivan

## 2016-09-30 NOTE — Progress Notes (Signed)
PT Cancellation Note  Patient Details Name: Johnny Sullivan MRN: [id number] DOB: 01/20/1956   Cancelled Treatment:    Reason Eval/Treat Not Completed: Patient at procedure or test/unavailable.  Pt gone to Endo on arrival.  Will see later as able. 09/30/2016  Storden BingKen Burnell Matlin, PT [phone]  (pager)   Eliseo GumKenneth V Wally Shevchenko 09/30/2016, 1:23 PM

## 2016-09-30 NOTE — Consult Note (Signed)
ELECTROPHYSIOLOGY CONSULT NOTE  Patient ID: Johnny Sullivan MRN: [id number], DOB/AGE: 1955-07-16   Admit date: 09/26/2016 Date of Consult: 09/30/2016  Primary Physician: System, Pcp Not In Primary Cardiologist: new to [facility] Reason for Consultation: Cryptogenic stroke; recommendations regarding Implantable Loop Recorder  History of Present Illness Johnny Sullivan seen at the request of Dr Roda Shutters for evaluation of ILR placement in the setting of cryptogenic stroke. He was admitted on 09/26/2016 with aphasia and right hemiplegia.  Imaging demonstrated left MCA infarct 2/2 left ICA and MCA occlusion felt to be embolic 2/2 unknown source.  He was treated with tPA and required transient intubation. He also has had suprapubic catheter placed.  He has undergone workup for stroke including echocardiogram and carotid dopplers.  The patient has been monitored on telemetry which has demonstrated sinus rhythm with no arrhythmias.  Inpatient stroke work-up is to be completed with a TEE.   Echocardiogram this admission demonstrated EF 50-55%, grade 1 diastolic dysfunction, LA 42.  Lab work is reviewed.  Prior to admission, the patient denies chest pain, shortness of breath, dizziness, palpitations, or syncope.  They are recovering from their stroke with plans to return home at discharge.  EP has been asked to evaluate for placement of an implantable loop recorder to monitor for atrial fibrillation.  Past Medical History:  Diagnosis Date  . Arthritis    Osteoarthritis- right hip  . Coronary artery disease   . GERD (gastroesophageal reflux disease)   . Hypertension   . Myocardial infarction (HCC)    " mild"-Dr. Abbey Chatters follows -gives clearance.  Marland Kitchen Urethral stricture    'uses self [place] caths weekly" to correct.     Surgical History:  Past Surgical History:  Procedure Laterality Date  . BACK SURGERY     lumbar discectomy  . CARDIAC CATHETERIZATION    . CORONARY ARTERY  BYPASS GRAFT     09-20-2012 x3 -[place]  . IR PERCUTANEOUS ART THROMBECTOMY/INFUSION INTRACRANIAL INC DIAG ANGIO  09/26/2016  . RADIOLOGY WITH ANESTHESIA N/A 09/26/2016   Procedure: RADIOLOGY WITH ANESTHESIA;  Surgeon: Julieanne Cotton, MD;  Location: [facility] OR;  Service: Radiology;  Laterality: N/A;  . TOTAL HIP ARTHROPLASTY Right 04/26/2016   Procedure: RIGHT TOTAL HIP ARTHROPLASTY ANTERIOR APPROACH;  Surgeon: Durene Romans, MD;  Location: [facility] ORS;  Service: Orthopedics;  Laterality: Right;  Marland Kitchen VASECTOMY    . WRIST SURGERY     "implant of plastic bone"-mild ROM limits     Prescriptions Prior to Admission  Medication Sig Dispense Refill Last Dose  . aspirin EC 81 MG tablet Take 81 mg by mouth daily.   09/26/2016 at Unknown time  . atorvastatin (LIPITOR) 80 MG tablet Take 80 mg by mouth daily at 6 PM.   3 09/25/2016 at Unknown time  . hydrochlorothiazide (HYDRODIURIL) 25 MG tablet Take 25 mg by mouth daily as needed (fluid retention).   3 PRN at Unknown time  . metoprolol tartrate (LOPRESSOR) 25 MG tablet Take 25 mg by mouth 2 (two) times daily.   3 09/26/2016 at 0730  . ranitidine (ZANTAC) 150 MG capsule Take 150 mg by mouth daily as needed for heartburn.    Past Week at Unknown time  . amoxicillin (AMOXIL) 500 MG capsule Take 2,000 mg by mouth See admin instructions. ONE HOUR PRIOR TO DENTAL APPOINTMENT  0 Not taken recently    Inpatient Medications:  . aspirin  325 mg Oral Daily   Or  . aspirin  300 mg Rectal Daily  . atorvastatin  80 mg Oral q1800  . folic acid  1 mg Intravenous Daily  . mouth rinse  15 mL Mouth Rinse BID  . pantoprazole (PROTONIX) IV  40 mg Intravenous Q12H  . thiamine injection  100 mg Intravenous Daily    Allergies:  Allergies  Allergen Reactions  . Alteplase Swelling and Other (See Comments)    Angioedema   . Vancomycin Other (See Comments)    Arletha Grippe syndrome    Social History   Social History  . Marital status: Married    Spouse name: N/A  . Number of  children: N/A  . Years of education: N/A   Occupational History  . Not on file.   Social History Main Topics  . Smoking status: Former Smoker    Packs/day: 1.50    Years: 15.00    Types: Cigarettes    Quit date: 04/20/2012  . Smokeless tobacco: Former Neurosurgeon    Types: Chew    Quit date: 04/20/2012  . Alcohol use Yes     Comment: occ. use  . Drug use: No  . Sexual activity: Yes   Other Topics Concern  . Not on file   Social History Narrative  . No narrative on file     Family History: no history of stroke     Review of Systems: All other systems reviewed and are otherwise negative except as noted above.  Physical Exam: Vitals:   09/29/16 1900 09/29/16 1946 09/30/16 0124 09/30/16 0501  BP: (!) 155/68 (!) 163/64 (!) 133/51 133/61  Pulse:  73 65 76  Resp: 16 20 20 20   Temp:  97.7 F (36.5 C) 98.2 F (36.8 C) 98.2 F (36.8 C)  TempSrc:  Oral Oral Oral  SpO2:  98% 95% 95%  Weight:      Height:        GEN- The patient is well appearing, +expressive aphasia, but appears to understand Head- normocephalic, atraumatic Eyes-  Sclera clear, conjunctiva pink Ears- hearing intact Oropharynx- clear Neck- supple Lungs- normal work of breathing Heart- Regular rate and rhythm  GI- soft, NT, ND, + BS Extremities- no clubbing, cyanosis, or edema MS- no significant deformity or atrophy Skin- no rash or lesion Psych- +aphasia   Labs:   Lab Results  Component Value Date   WBC 12.3 (H) 09/30/2016   HGB 11.2 (L) 09/30/2016   HCT 35.4 (L) 09/30/2016   MCV 84.3 09/30/2016   PLT 325 09/30/2016    Recent Labs Lab 09/26/16 1851  09/30/16 0522  NA 142  < > 140  K 3.6  < > 3.9  CL 107  < > 104  CO2 23  < > 26  BUN 18  < > 14  CREATININE 1.15  < > 0.75  CALCIUM 9.7  < > 8.9  PROT 8.0  --   --   BILITOT 0.5  --   --   ALKPHOS 95  --   --   ALT 28  --   --   AST 34  --   --   GLUCOSE 132*  < > 108*  < > = values in this interval not displayed. No results found  for: CKTOTAL, CKMB, CKMBINDEX, TROPONINI   Radiology/Studies:  Mr Brain Wo Contrast Result Date: 09/27/2016 CLINICAL DATA:  Follow-up LEFT internal carotid artery occlusion and LEFT MCA stroke. Status post endovascular revascularization LEFT internal carotid artery, LEFT MCA and LEFT ACA. EXAM: MRI HEAD WITHOUT CONTRAST MRA HEAD WITHOUT CONTRAST TECHNIQUE: Multiplanar, multiecho pulse  sequences of the brain and surrounding structures were obtained without intravenous contrast. Angiographic images of the head were obtained using MRA technique without contrast. COMPARISON:  CT and CTA  HEAD Sep 26, 2016. FINDINGS: MRI HEAD FINDINGS BRAIN: Patchy to confluent reduced diffusion LEFT frontoparietal and temporal lobes, to lesser extent LEFT basal ganglia with low ADC values. Punctate areas of susceptibility artifact LEFT frontal lobe, LEFT insula, some which correspond to density on prior CT. No lobar hematoma. Regional LEFT cerebrum mass effect without significant midline shift. No hydrocephalus. A few scattered supratentorial subcentimeter white matter FLAIR T2 hyperintensities exclusive a aforementioned abnormality compatible with mild chronic small vessel ischemic disease. VASCULAR: Normal major intracranial vascular flow voids present at skull base. SKULL AND UPPER CERVICAL SPINE: No abnormal sellar expansion. No suspicious calvarial bone marrow signal. Craniocervical junction maintained. SINUSES/ORBITS: The mastoid air-cells and included paranasal sinuses are well-aerated. The included ocular globes and orbital contents are non-suspicious. OTHER: None. MRA HEAD FINDINGS ANTERIOR CIRCULATION: Flow related enhancement of the included cervical, petrous, cavernous and supraclinoid internal carotid arteries. Pulsation artifact limits assessment of the carotid terminus bilaterally. Patent anterior communicating artery. Developmentally smaller RIGHT anterior cerebral artery. Severe stenosis LEFT M2 versus M3 origin  given early bifurcation. No large vessel occlusion, abnormal luminal irregularity, aneurysm. POSTERIOR CIRCULATION: LEFT vertebral artery is dominant. Basilar artery is patent, with normal flow related enhancement of the main branch vessels. Normal flow related enhancement of the posterior cerebral arteries. Small LEFT and robust RIGHT posterior communicating artery is present. No large vessel occlusion, high-grade stenosis, abnormal luminal irregularity, aneurysm. ANATOMIC VARIANTS: None. Source images and MIP images were reviewed. IMPRESSION: MRI HEAD: Acute large LEFT MCA territory infarct with petechial hemorrhage. Mild chronic small vessel ischemic disease. MRA HEAD: Successful re- vascularization of LEFT internal carotid artery. Severe stenosis LEFT M2 vs. M3 origin, possible residual thromboembolism. Electronically Signed   By: Awilda Metroourtnay  Bloomer M.D.   On: 09/27/2016 20:14   12-lead ECG sinus tach, rate 105 All prior EKG's in EPIC reviewed with no documented atrial fibrillation  Telemetry sinus rhythm, rare PVC's  Assessment and Plan:  1. Cryptogenic stroke The patient presents with cryptogenic stroke.  The patient has a TEE planned for later today.  I spoke at length with the patient about monitoring for afib with an implantable loop recorder.  Risks, benefits, and alteratives to implantable loop recorder were discussed with the patient today.   At this time, the patient is very clear in their decision to proceed with implantable loop recorder.   Wound care was reviewed with the patient (keep incision clean and dry for 3 days).     Please call with questions.   Gypsy BalsamAmber Seiler, NP 09/30/2016 9:45 AM  I have seen, examined the patient, and reviewed the above assessment and plan.  On exam, RRR.  Changes to above are made where necessary.  Risks of ILR discussed with the patient and family who wishes to proceed.  Remote monitoring also discussed today.  Co Sign: Hillis RangeJames Ileah Falkenstein,  MD 09/30/2016 11:24 AM

## 2016-09-30 NOTE — Care Management Note (Signed)
Case Management Note  Patient Details  Name: Johnny Sullivan MRN: [id number] Date of Birth: 01/01/1956  Subjective/Objective:                    Action/Plan: When patient is medically ready for d/c patient will d/c home with family. CM consulted for outpatient OT and ST. CM spoke with the [facility] and [facility] CM verified this with Dr Roda ShuttersXu. Orders placed in EPIC and information on the AVS.  Family to provide transportation home.   Expected Discharge Date:                  Expected Discharge Plan:  [facility] ([facility])  In-House Referral:     Discharge planning Services  CM Consult  Post Acute Care Choice:    Choice offered to:     DME Arranged:    DME Agency:     HH Arranged:    HH Agency:     Status of Service:  Completed, signed off  If discussed at [employer] Length of Stay Meetings, dates discussed:    Additional Comments:  Kermit BaloKelli F Mirella Gueye, RN 09/30/2016, 12:11 PM

## 2016-09-30 NOTE — Progress Notes (Signed)
  Echocardiogram Echocardiogram Transesophageal has been performed.  Janalyn HarderWest, Laniece Hornbaker R 09/30/2016, 2:53 PM

## 2016-10-01 ENCOUNTER — Encounter (HOSPITAL_COMMUNITY): Payer: Self-pay | Admitting: Cardiology

## 2016-10-01 ENCOUNTER — Inpatient Hospital Stay (HOSPITAL_COMMUNITY): Payer: BLUE CROSS/BLUE SHIELD

## 2016-10-01 DIAGNOSIS — I639 Cerebral infarction, unspecified: Secondary | ICD-10-CM

## 2016-10-01 MED ORDER — FOLIC ACID 1 MG PO TABS: 1.0000 mg | ORAL_TABLET | Freq: Every day | ORAL | Status: DC

## 2016-10-01 MED ORDER — PANTOPRAZOLE SODIUM 40 MG PO TBEC
40.0000 mg | DELAYED_RELEASE_TABLET | Freq: Two times a day (BID) | ORAL | Status: DC
  Administered 2016-10-01: 40 mg via ORAL
  Filled 2016-10-01: qty 1

## 2016-10-01 MED ORDER — FOLIC ACID 1 MG PO TABS
1.0000 mg | ORAL_TABLET | Freq: Every day | ORAL | Status: DC
  Administered 2016-10-01: 1 mg via ORAL
  Filled 2016-10-01: qty 1

## 2016-10-01 MED ORDER — ASPIRIN 325 MG PO TABS: 325.0000 mg | ORAL_TABLET | Freq: Every day | ORAL | Status: DC

## 2016-10-01 MED ORDER — VITAMIN B-1 100 MG PO TABS
100.0000 mg | ORAL_TABLET | Freq: Every day | ORAL | Status: DC
  Administered 2016-10-01: 100 mg via ORAL
  Filled 2016-10-01: qty 1

## 2016-10-01 MED ORDER — THIAMINE HCL 100 MG PO TABS: 100.0000 mg | ORAL_TABLET | Freq: Every day | ORAL | Status: DC

## 2016-10-01 NOTE — Progress Notes (Signed)
   10/01/16 0640  Output (mL)  Urine 275 mL  Urine Characteristics  Urinary Incontinence No  Urine Color Yellow/straw  Urine Appearance Clear  Urine Odor No odor  Urinary Interventions Bladder scan  Bladder Scan Volume (mL) 16 mL

## 2016-10-01 NOTE — Discharge Instructions (Signed)
1. Increase activity gradually as tolerated 2. Outpatient therapy as instructed 3. Use suprapubic catheter as instructed until seen by Dr. Annabell HowellsWrenn 4. Find a primary care physician for regular follow-up.

## 2016-10-01 NOTE — Progress Notes (Signed)
VASCULAR LAB PRELIMINARY  PRELIMINARY  PRELIMINARY  PRELIMINARY  Bilateral lower extremity venous duplex completed.    Preliminary report: There is no DVT or SVT noted in the bilateral lower extremities.   Chadd Tollison, RVT 10/01/2016, 8:40 AM

## 2016-10-01 NOTE — Progress Notes (Signed)
Pt being discharged from hospital per orders from MD. Pt and family verbalized understanding of discharge. All questions and concerns were addressed. Pt's IV was removed prior to discharge. Pt exited the hospital via wheelchair.

## 2016-10-01 NOTE — Progress Notes (Signed)
Patient ID: Johnny Sullivan, male   DOB: 12/10/1955, 61 y.o.   MRN: [id number]  1 Day Post-Op Subjective: Pt voiding well with SP tube clamped now.  He has voided well 3 times with PVRs between 16-20 cc.  Objective: Vital signs in last 24 hours: Temp:  [97.7 F (36.5 C)-99.1 F (37.3 C)] 98.6 F (37 C) (05/26 0544) Pulse Rate:  [63-92] 87 (05/26 0544) Resp:  [10-19] 16 (05/26 0544) BP: (107-168)/(38-82) 149/69 (05/26 0544) SpO2:  [94 %-100 %] 94 % (05/26 0544) Weight:  [97.4 kg (214 lb 11.2 oz)-107 kg (236 lb)] 97.4 kg (214 lb 11.2 oz) (05/26 0500)  Intake/Output from previous day: 05/25 0701 - 05/26 0700 In: 840 [P.O.:240; I.V.:600] Out: 1375 [Urine:1375] Intake/Output this shift: No intake/output data recorded.  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  [facility]  - Ok to discharge home with SP tube plugged (nursing advised to place catheter plug and prepare patient to go home with a separate catheter bag in case they need to hook SP tube back up). - F/U with Dr. Annabell HowellsWrenn next week for further evaluation and possible SP tube removal.   LOS: 5 days   Jaquavion Mccannon,LES 10/01/2016, 8:06 AM

## 2016-10-02 LAB — VAS US LOWER EXTREMITY VENOUS (DVT)
RCCAPSYS: 101 cm/s
RIGHT VERTEBRAL DIAS: -10 cm/s
Right CCA prox dias: 16 cm/s

## 2016-10-04 ENCOUNTER — Ambulatory Visit: Payer: BLUE CROSS/BLUE SHIELD | Admitting: *Deleted

## 2016-10-04 ENCOUNTER — Ambulatory Visit: Payer: BLUE CROSS/BLUE SHIELD | Attending: Neurology | Admitting: Occupational Therapy

## 2016-10-04 ENCOUNTER — Encounter (HOSPITAL_COMMUNITY): Payer: Self-pay | Admitting: Internal Medicine

## 2016-10-04 DIAGNOSIS — M6281 Muscle weakness (generalized): Secondary | ICD-10-CM | POA: Diagnosis present

## 2016-10-04 DIAGNOSIS — R208 Other disturbances of skin sensation: Secondary | ICD-10-CM | POA: Insufficient documentation

## 2016-10-04 DIAGNOSIS — I69351 Hemiplegia and hemiparesis following cerebral infarction affecting right dominant side: Secondary | ICD-10-CM | POA: Insufficient documentation

## 2016-10-04 DIAGNOSIS — R278 Other lack of coordination: Secondary | ICD-10-CM | POA: Diagnosis present

## 2016-10-04 DIAGNOSIS — I69820 Aphasia following other cerebrovascular disease: Secondary | ICD-10-CM | POA: Diagnosis present

## 2016-10-04 DIAGNOSIS — R482 Apraxia: Secondary | ICD-10-CM | POA: Insufficient documentation

## 2016-10-04 DIAGNOSIS — I69890 Apraxia following other cerebrovascular disease: Secondary | ICD-10-CM | POA: Insufficient documentation

## 2016-10-04 NOTE — Patient Instructions (Signed)
APRAXIA: Using a mirror, practice the following lip/tongue positions:  /m/  /t/, /d/  /s/ - snake  /sh/  READING COMPREHENSION Write down 2 words and ask Johnny Sullivan to point to one of them  AUDITORY COMPREHENSION Have Johnny Sullivan follow simple directions using body parts or objects  Write down yes/no on a piece of paper and ask simple questions, encouraging Johnny Sullivan to point to the correct word

## 2016-10-04 NOTE — Therapy (Deleted)
[facility] [address] [place], [place], [address] Phone: [phone]   Fax:  [phone]  Speech Language Pathology Evaluation  Patient Details  Name: Johnny Sullivan MRN: [id number] Date of Birth: 11/20/1955 Referring Provider: Dr. Marvel PlanJindong Xu  Encounter Date: 10/04/2016      End of Session - 10/04/16 1427    Visit Number 1   Number of Visits 17   Date for SLP Re-Evaluation 12/02/16   SLP Start Time 1015   SLP Stop Time  1100   SLP Time Calculation (min) 45 min   Activity Tolerance Patient tolerated treatment well      Past Medical History:  Diagnosis Date  . Arthritis    Osteoarthritis- right hip  . Coronary artery disease   . GERD (gastroesophageal reflux disease)   . Hypertension   . Myocardial infarction (HCC)    " mild"-Dr. Abbey ChattersZakhary,cardiolgy[place] follows -gives clearance.  Marland Kitchen. Urethral stricture    'uses self [place] cath[place] weekly" to correct.    Past Surgical History:  Procedure Laterality Date  . BACK SURGERY     lumbar discectomy  . CARDIAC CATHETERIZATION    . CORONARY ARTERY BYPASS GRAFT     09-20-2012 x3 -[place]  . IR PERCUTANEOUS ART THROMBECTOMY/INFUSION INTRACRANIAL INC DIAG ANGIO  09/26/2016  . LOOP RECORDER INSERTION N/A 09/30/2016   Procedure: Loop Recorder Insertion;  Surgeon: Hillis RangeAllred, James, MD;  [facility];  Service: Cardiovascular;  Laterality: N/A;  . RADIOLOGY WITH ANESTHESIA N/A 09/26/2016   Procedure: RADIOLOGY WITH ANESTHESIA;  Surgeon: Julieanne Cottoneveshwar, Sanjeev, MD;  Location: [facility] OR;  Service: Radiology;  Laterality: N/A;  . TEE WITHOUT CARDIOVERSION N/A 09/30/2016   Procedure: TRANSESOPHAGEAL ECHOCARDIOGRAM (TEE);  Surgeon: Laurey MoraleMcLean, Dalton S, MD;  [facility];  Service: Cardiovascular;  Laterality: N/A;  . TOTAL HIP ARTHROPLASTY Right 04/26/2016   Procedure: RIGHT TOTAL HIP ARTHROPLASTY ANTERIOR APPROACH;  Surgeon: Durene RomansMatthew Olin, MD;  Location: [facility]  ORS;  Service: Orthopedics;  Laterality: Right;  Marland Kitchen. VASECTOMY    . WRIST SURGERY     "implant of plastic bone"-mild ROM limits    There were no vitals filed for this visit.      Subjective Assessment - 10/04/16 1023    Subjective "mom" is the only utterance pt able to use at this time   Patient is accompained by: Family member  Johnny BraunKaren (wife)   Currently in Pain? No/denies            SLP Evaluation [facility] - 10/04/16 1023      SLP Visit Information   SLP Received On 10/04/16   Referring Provider Dr. Marvel PlanJindong Xu   Onset Date 09/26/16   Medical Diagnosis Left MCA infarct with petechial hemorrhage     Subjective   Subjective Pt seen in ST office with wife in attendance   Patient/Family Stated Goal more effective communication     General Information   HPI [age] year old male referred for outpatient evaluation following hospitalization for large left MCA stroke. Pt was intubated for 3 days. Dysphagia post-extubation has resolved per pt/wife.    Behavioral/Cognition Pt able to participate in evaluation   Mobility Status Pt ambulates without assistive device     Prior Functional Status   Cognitive/Linguistic Baseline Within functional limits   Type of Home House    Lives With Spouse   Available Support Family   Education college graduate   Vocation Other (comment)  works as a Visual merchandiserfarmer of tobacco, soybeans, corn and wheat  Pain Assessment   Pain Assessment No/denies pain     Cognition   Overall Cognitive Status Difficult to assess  given severity of speech and language impairments   Difficult to assess due to Impaired communication, severe verbal apraxia     Auditory Comprehension   Overall Auditory Comprehension Impaired   Basic Biographical Questions 51-75% accurate   Commands Impaired   One Step Basic Commands 50-74% accurate   Conversation Simple   Interfering Components Motor planning  severe verbal apraxia   EffectiveTechniques Extra processing  time;Visual/Gestural cues   Overall Auditory Comprehension Comments Pt yes/no responses are not reliable. Is is unable to consistently use written word to facilitate correct response. Pt is able to follow one step direction only when given verbal and visual cues. He is able to identify pictures in a field of 4 accurately, but accuracy deteriorates with increased choices. Pt noted to be perseverative.      Reading Comprehension   Reading Status Impaired   Word level 51-75% accurate  choosing between 2 single syllable words   Sentence Level 0-25% accurate   Paragraph Level 0-25% accurate     Expression   Primary Mode of Expression Nonverbal - gestures     Verbal Expression   Overall Verbal Expression Impaired - "mom" is the only utterance pt is able to produce at this time.   Initiation No impairment   Automatic Speech Singing  able to produce the tune "happy birthday", but without words   Level of Generative/Spontaneous Verbalization Word   Interfering Components Other (comment)  severe apraxia   Non-Verbal Means of Communication Gestures;Communication board     Written Expression   Dominant Hand Right   Written Expression Not tested  will assess next session     Oral Motor/Sensory Function   Overall Oral Motor/Sensory Function Impaired   Labial ROM Reduced right   Labial Symmetry Abnormal symmetry right   Lingual ROM Reduced right   Lingual Symmetry Abnormal symmetry right   Facial Symmetry Right droop   Mandible Within Functional Limits     Motor Speech   Overall Motor Speech Impaired   Respiration Within functional limits   Phonation Normal   Resonance Within functional limits   Articulation Impaired   Level of Impairment Word   Intelligibility Unable to assess (comment)   Motor Planning Impaired   Level of Impairment Word   Motor Speech Errors Aware;Consistent  "mom" is the only utterance pt able to make   Phonation [facility]     Assessment   SLP  Recommendation/Assessment Patient needs continued Speech Language Pathology Services   SLP Visit Diagnosis Aphasia (R47.01);Apraxia (R48.2)   Problem List Auditory comprehension;Reading comprehension;Written expression;Verbal expression   Problem List Comments Pt presents with receptive aphasia, characterized by difficulty following 1-step verbal commands and answering yes/no questions accurately. Expressively, "mom" is the only utterance pt able to produce due to severe verbal apraxia. Reading comprehension and written expression will be assessed in future sessions. No difficulty swallowing was reported by pt or his wife.     SLP Recommendations   Follow up Recommendations Outpatient SLP   SLP Equipment None recommended by SLP     Individuals Consulted   Consulted and Agree with Results and Recommendations Patient;Family member/caregiver   Family Member Consulted  wife Johnny Sullivan                         SLP Education - 10/04/16 1427    Education provided  Yes   Education Details activities to do with pt at home, focusing on apraxia, auditory comprehension, and reading comprehension   Person(s) Educated Patient;Spouse   Methods Explanation;Demonstration;Verbal cues;Handout   Comprehension Verbal cues required;Need further instruction          SLP Short Term Goals - 2016/10/17 1436      SLP SHORT TERM GOAL #3   Title Pt will imitate 3 sounds accurately given max visual and verbal cues          SLP Long Term Goals - 10/17/16 1436      SLP LONG TERM GOAL #1   Title Pt will answer complex yes/no questions accurately, either by head nod, pointing to the correct word, or verbalizing "yes" or "no"   Time 8   Period Weeks   Status New     SLP LONG TERM GOAL #2   Title Pt will follow 2-step verbal directions consistently, given min (verbal only) cues.   Time 8   Period Weeks   Status New     SLP LONG TERM GOAL #3   Title Pt will imitate 5 sounds accurately given max  visual and verbal cues   Time 8   Period Weeks   Status New          Plan - Oct 17, 2016 1428    Clinical Impression Statement Pt presents with severe verbal apraxia, as well as moderate receptive aphasia. Verbal expression is unable to be tested at this time due to the severity of pt's apraxia. Pt would benefit from skilled ST intervention to maximize functional and effective communication, decrease caregiver burden, and improve quality of life.    Speech Therapy Frequency 2x / week   Duration --  8 weeks   Treatment/Interventions Cueing hierarchy;SLP instruction and feedback;Language facilitation;Environmental controls;Compensatory techniques;Functional tasks;Compensatory strategies;Multimodal communcation approach;Patient/family education   Potential to Achieve Goals Good   Potential Considerations Ability to learn/carryover information;Family/community support;Previous level of function;Cooperation/participation level   SLP Home Exercise Plan reviewed and provided   Consulted and Agree with Plan of Care Patient;Family member/caregiver   Family Member Consulted wife Johnny Sullivan      Patient will benefit from skilled therapeutic intervention in order to improve the following deficits and impairments:   Apraxia following other cerebrovascular disease  Aphasia following other cerebrovascular disease      G-Codes - 10-17-16 1438    Functional Assessment Tool Used asha noms, clinical judgment   Functional Limitations Spoken language comprehension   Spoken Language Comprehension Current Status [phone]) At least 40 percent but less than 60 percent impaired, limited or restricted   Spoken Language Comprehension Goal Status (G9160) At least 20 percent but less than 40 percent impaired, limited or restricted      Problem List Patient Active Problem List   Diagnosis Date Noted  . Acute embolic stroke (HCC)   . Cerebrovascular accident (CVA) due to thrombosis of precerebral artery (HCC)   .  Cerebrovascular accident (CVA) (HCC) 09/26/2016  . S/P right THA, AA 04/26/2016    Bueche, Ruffin Pyo Oct 17, 2016, 2:39 PM  [facility] [address] [place], [place], [address] Phone: [phone]   Fax:  [phone]  Name: Johnny Sullivan MRN: [id number] Date of Birth: 07-Sep-1955

## 2016-10-04 NOTE — Therapy (Signed)
[facility] [address] [place], [place], [address] Phone: [phone]   Fax:  [phone]  Speech Language Pathology Evaluation  Patient Details  Name: Read Drivershillip Mckelvy MRN: [id number] Date of Birth: 11/20/1955 Referring Provider: Dr. Marvel PlanJindong Xu  Encounter Date: 10/04/2016      End of Session - 10/04/16 1427    Visit Number 1   Number of Visits 17   Date for SLP Re-Evaluation 12/02/16   SLP Start Time 1015   SLP Stop Time  1100   SLP Time Calculation (min) 45 min   Activity Tolerance Patient tolerated treatment well      Past Medical History:  Diagnosis Date  . Arthritis    Osteoarthritis- right hip  . Coronary artery disease   . GERD (gastroesophageal reflux disease)   . Hypertension   . Myocardial infarction (HCC)    " mild"-Dr. Abbey ChattersZakhary,cardiolgy[place] -gives clearance.  Marland Kitchen. Urethral stricture    'uses self [place] weekly" to correct.    Past Surgical History:  Procedure Laterality Date  . BACK SURGERY     lumbar discectomy  . CARDIAC CATHETERIZATION    . CORONARY ARTERY BYPASS GRAFT     09-20-2012 x3 -[place]  . IR PERCUTANEOUS ART THROMBECTOMY/INFUSION INTRACRANIAL INC DIAG ANGIO  09/26/2016  . LOOP RECORDER INSERTION N/A 09/30/2016   Procedure: Loop Recorder Insertion;  Surgeon: Hillis RangeAllred, James, MD;  [facility];  Service: Cardiovascular;  Laterality: N/A;  . RADIOLOGY WITH ANESTHESIA N/A 09/26/2016   Procedure: RADIOLOGY WITH ANESTHESIA;  Surgeon: Julieanne Cottoneveshwar, Sanjeev, MD;  Location: [facility] OR;  Service: Radiology;  Laterality: N/A;  . TEE WITHOUT CARDIOVERSION N/A 09/30/2016   Procedure: TRANSESOPHAGEAL ECHOCARDIOGRAM (TEE);  Surgeon: Laurey MoraleMcLean, Dalton S, MD;  [facility];  Service: Cardiovascular;  Laterality: N/A;  . TOTAL HIP ARTHROPLASTY Right 04/26/2016   Procedure: RIGHT TOTAL HIP ARTHROPLASTY ANTERIOR APPROACH;  Surgeon: Durene RomansMatthew Olin, MD;  Location: [facility]  ORS;  Service: Orthopedics;  Laterality: Right;  Marland Kitchen. VASECTOMY    . WRIST SURGERY     "implant of plastic bone"-mild ROM limits    There were no vitals filed for this visit.      Subjective Assessment - 10/04/16 1023    Subjective "mom" is the only utterance pt able to use at this time   Patient is accompained by: Family member  Clydie BraunKaren (wife)   Currently in Pain? No/denies            SLP Evaluation [facility] - 10/04/16 1023      SLP Visit Information   SLP Received On 10/04/16   Referring Provider Dr. Marvel PlanJindong Xu   Onset Date 09/26/16   Medical Diagnosis Left MCA infarct with petechial hemorrhage     Subjective   Subjective Pt seen in ST office with wife in attendance   Patient/Family Stated Goal more effective communication     General Information   HPI [age] year old male referred for outpatient evaluation following hospitalization for large left MCA stroke. Pt was intubated for 3 days. Dysphagia post-extubation has resolved per pt/wife.    Behavioral/Cognition Pt able to participate in evaluation   Mobility Status Pt ambulates without assistive device     Prior Functional Status   Cognitive/Linguistic Baseline Within functional limits   Type of Home House    Lives With Spouse   Available Support Family   Education college graduate   Vocation Other (comment)  works as a Visual merchandiserfarmer of tobacco, soybeans, corn and wheat  Pain Assessment   Pain Assessment No/denies pain     Cognition   Overall Cognitive Status Difficult to assess  given severity of speech and language impairments   Difficult to assess due to Impaired communication     Auditory Comprehension   Overall Auditory Comprehension Impaired   Basic Biographical Questions 51-75% accurate   Commands Impaired   One Step Basic Commands 50-74% accurate   Conversation Simple   Interfering Components Motor planning  severe verbal apraxia   EffectiveTechniques Extra processing time;Visual/Gestural cues   Overall  Auditory Comprehension Comments Pt yes/no responses are not reliable. Is is unable to consistently use written word to facilitate correct response. Pt is able to follow one step direction only when given verbal and visual cues. He is able to identify pictures in a field of 4 accurately, but accuracy deteriorates with increased choices. Pt noted to be perseverative.      Reading Comprehension   Reading Status Impaired   Word level 51-75% accurate  choosing between 2 single syllable words   Sentence Level 0-25% accurate   Paragraph Level 0-25% accurate     Expression   Primary Mode of Expression Nonverbal - gestures     Verbal Expression   Overall Verbal Expression Impaired   Initiation No impairment   Automatic Speech Singing  able to produce the tune "happy birthday", but without words   Level of Generative/Spontaneous Verbalization Word   Interfering Components Other (comment)  severe apraxia   Non-Verbal Means of Communication Gestures;Communication board     Written Expression   Dominant Hand Right   Written Expression Not tested  will assess next session     Oral Motor/Sensory Function   Overall Oral Motor/Sensory Function Impaired   Labial ROM Reduced right   Labial Symmetry Abnormal symmetry right   Lingual ROM Reduced right   Lingual Symmetry Abnormal symmetry right   Facial Symmetry Right droop   Mandible Within Functional Limits     Motor Speech   Overall Motor Speech Impaired   Respiration Within functional limits   Phonation Normal   Resonance Within functional limits   Articulation Impaired   Level of Impairment Word   Intelligibility Unable to assess (comment)   Motor Planning Impaired   Level of Impairment Word   Motor Speech Errors Aware;Consistent  "mom" is the only utterance pt able to make   [facility]     Assessment   SLP Recommendation/Assessment Patient needs continued Speech Language Pathology Services   SLP Visit Diagnosis Aphasia  (R47.01);Apraxia (R48.2)   Problem List Auditory comprehension;Reading comprehension;Written expression;Verbal expression   Problem List Comments Pt presents with moderate receptive aphasia, characterized by difficulty with accuracy of yes/no responses and following simple verbal commands. Verbal expression is significantly affected by severe verbal apraxia, which currently limits pt to one utterance -  "mom". Reading comprehension and written expression will be assessed more fully over the next sessions. Pt would benefit from skilled ST intervention targeting receptive and expressive language as well as motor speech.      SLP Recommendations   Follow up Recommendations Outpatient SLP   SLP Equipment None recommended by SLP     Individuals Consulted   Consulted and Agree with Results and Recommendations Patient;Family member/caregiver   Family Member Consulted  wife Clydie BraunKaren                         SLP Education - 10/04/16 1427    Education provided  Yes   Education Details activities to do with pt at home, focusing on apraxia, auditory comprehension, and reading comprehension   Person(s) Educated Patient;Spouse   Methods Explanation;Demonstration;Verbal cues;Handout   Comprehension Verbal cues required;Need further instruction          SLP Short Term Goals - 11-02-2016 1436      SLP SHORT TERM GOAL #1   Title Pt will answer simple yes/no questions accurately, either by head nod, pointing to the correct word, or verbalizing "yes" or "no"   Time 4   Period Weeks   Status New     SLP SHORT TERM GOAL #2   Title Pt will follow 1-step verbal directions consistently, given mod (verbal/visual/tactile) cues.   Time 4   Period Weeks   Status New     SLP SHORT TERM GOAL #3   Title Pt will imitate 3 sounds accurately given max visual and verbal cues   Baseline able to imitate only /ma/   Time 4   Period Weeks   Status New     SLP SHORT TERM GOAL #4   Title Pt will  participate in further assessment of reading comprehension and written expression   Baseline able to point to single word, field of 2   Time 4   Period Weeks   Status New          SLP Long Term Goals - Nov 02, 2016 1444      SLP LONG TERM GOAL #1   Title Pt will answer complex yes/no questions accurately, either by head nod, pointing to the correct word, or verbalizing "yes" or "no"   Time 8   Period Weeks   Status New     SLP LONG TERM GOAL #2   Title Pt will follow 2-step verbal directions consistently, given min (verbal only) cues.   Time 8   Period Weeks   Status New     SLP LONG TERM GOAL #3   Title Pt will imitate 5 sounds accurately given max visual and verbal cues   Time 8   Period Weeks   Status New          Plan - 2016-11-02 1428    Clinical Impression Statement Pt presents with severe verbal apraxia, as well as moderate receptive aphasia. Verbal expression is unable to be tested at this time due to the severity of pt's apraxia. Pt would benefit from skilled ST intervention to maximize functional and effective communication, decrease caregiver burden, and improve quality of life.    Speech Therapy Frequency 2x / week   Duration --  8 weeks   Treatment/Interventions Cueing hierarchy;SLP instruction and feedback;Language facilitation;Environmental controls;Compensatory techniques;Functional tasks;Compensatory strategies;Multimodal communcation approach;Patient/family education   Potential to Achieve Goals Good   Potential Considerations Ability to learn/carryover information;Family/community support;Previous level of function;Cooperation/participation level   SLP Home Exercise Plan reviewed and provided   Consulted and Agree with Plan of Care Patient;Family member/caregiver   Family Member Consulted wife Clydie Braun      Patient will benefit from skilled therapeutic intervention in order to improve the following deficits and impairments:   Apraxia following other  cerebrovascular disease  Aphasia following other cerebrovascular disease      G-Codes - Nov 02, 2016 1438    Functional Assessment Tool Used asha noms, clinical judgment   Functional Limitations Spoken language comprehension   Spoken Language Comprehension Current Status [phone]) At least 40 percent but less than 60 percent impaired, limited or restricted   Spoken Language Comprehension Goal Status (  [id number]) At least 20 percent but less than 40 percent impaired, limited or restricted      Problem List Patient Active Problem List   Diagnosis Date Noted  . Acute embolic stroke (HCC)   . Cerebrovascular accident (CVA) due to thrombosis of precerebral artery (HCC)   . Cerebrovascular accident (CVA) (HCC) 09/26/2016  . S/P right THA, AA 04/26/2016   Yon Schiffman B. [place], MSP, CCC-SLP  Leigh Aurora 10/04/2016, 3:39 PM  [facility] [address] [place], [place], [address] Phone: [phone]   Fax:  [phone]  Name: Gloria Lambertson MRN: [id number] Date of Birth: 1955/12/05

## 2016-10-04 NOTE — Therapy (Signed)
[facility] [address] [place], [place], [address] Phone: [phone]   Fax:  [phone]  Occupational Therapy Evaluation  Patient Details  Name: Johnny Sullivan MRN: [id number] Date of Birth: [birth year] Referring Provider: [name]  Encounter Date: 10/04/2016      OT End of Session - 10/04/16 1659    Visit Number 1   Number of Visits 16   Date for OT Re-Evaluation 11/29/16   Authorization Type BC[employer]- await clarification of visit limitations   OT Start Time 0847   OT Stop Time 0925   OT Time Calculation (min) 38 min   Activity Tolerance Patient tolerated treatment well      Past Medical History:  Diagnosis Date  . Arthritis    Osteoarthritis- right hip  . Coronary artery disease   . GERD (gastroesophageal reflux disease)   . Hypertension   . Myocardial infarction (HCC)    " mild"-Dr. Abbey Chatters follows -gives clearance.  Marland Kitchen Urethral stricture    'uses self [place] caths weekly" to correct.    Past Surgical History:  Procedure Laterality Date  . BACK SURGERY     lumbar discectomy  . CARDIAC CATHETERIZATION    . CORONARY ARTERY BYPASS GRAFT     09-20-2012 x3 -[place]  . IR PERCUTANEOUS ART THROMBECTOMY/INFUSION INTRACRANIAL INC DIAG ANGIO  09/26/2016  . LOOP RECORDER INSERTION N/A 09/30/2016   Procedure: Loop Recorder Insertion;  Surgeon: Hillis Range, MD;  [facility]: [facility] INVASIVE CV LAB;  Service: Cardiovascular;  Laterality: N/A;  . RADIOLOGY WITH ANESTHESIA N/A 09/26/2016   Procedure: RADIOLOGY WITH ANESTHESIA;  Surgeon: Julieanne Cotton, MD;  Location: [facility] OR;  Service: Radiology;  Laterality: N/A;  . TEE WITHOUT CARDIOVERSION N/A 09/30/2016   Procedure: TRANSESOPHAGEAL ECHOCARDIOGRAM (TEE);  Surgeon: Laurey Morale, MD;  Location: [facility] ENDOSCOPY;  Service: Cardiovascular;  Laterality: N/A;  . TOTAL HIP ARTHROPLASTY Right 04/26/2016   Procedure: RIGHT TOTAL HIP ARTHROPLASTY ANTERIOR  APPROACH;  Surgeon: Durene Romans, MD;  Location: [facility] ORS;  Service: Orthopedics;  Laterality: Right;  Marland Kitchen VASECTOMY    . WRIST SURGERY     "implant of plastic bone"-mild ROM limits    There were no vitals filed for this visit.      Subjective Assessment - 10/04/16 0850    Subjective  Pt globally aphasic - able to follow simple gestures.  Unable to generate any words   Patient is accompained by: Family member  wife Clydie Braun   Pertinent History see epic . 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   Patient Stated Goals unable to state - pointed to right hand    Currently in Pain? No/denies           [facility] OT Assessment - 10/04/16 0001      Assessment   Diagnosis L MCA CVA   Referring Provider [name]   Onset Date 09/26/16   Prior Therapy Acute ST and OT     Precautions   Precautions Other (comment)   Precaution Comments LOOP RECORDER     Restrictions   Weight Bearing Restrictions No     Balance Screen   Has the patient fallen in the past 6 months No     Home  Environment   Family/patient expects to be discharged to: Private residence   Living Arrangements Spouse/significant other  adult son staying with them currently. 4 adutl children    Available Help at Discharge Available 24 hours/day   Type of Home House  Home Layout One level   Bathroom Art therapisthower/Tub Walk-in Shower   Bathroom Toilet Handicapped height   Additional Comments shower seat but he is not currently not using.       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; retired from [employer]   Leisure hang out with family, fish, yard work and work around [employer] house, computer     ADL   Eating/Feeding Minimal assistance  cutting; using Left and more than before   Grooming Minimal assistance  min a to help pt shave   Upper Body Bathing Supervision/safety  for bottom   Lower Body Bathing Minimal assistance  for bottom   Upper  Body Dressing Minimal assistance  assist with buttons,    Lower Body Dressing Minimal assistance  assist for socks/shoes, tying shoes, pt had THR 04/2017   Toilet Transfer Independent   Toileting - Clothing Manipulation Minimal assistance  occassionally with buttons   Toileting -  Hygiene Minimal assistance  for bowel movement as pt is right handed   Web designerTub/Shower Transfer Supervision/safety     IADL   Shopping Needs to be accompanied on any shopping trip   Light Housekeeping Performs light daily tasks but cannot maintain acceptable level of cleanliness  pt just starting to do some light things in the yard   Meal Prep Needs to have meals prepared and served  pt did not cook before   [employer] Relies on family or friends for transportation   Medication Management Is not capable of dispensing or managing own medication  wife assisting at this time.   Financial Management Requires assistance     Mobility   Mobility Status Independent     Written Expression   Dominant Hand Right     Vision - History   Baseline Vision Wears glasses all the time     Vision Assessment   Eye Alignment Within Functional Limits  appears WFL's - wife does not notice any changes.   Comment Will monitor - pt states no with head shake however comprehension impaired. Pt's wife does notice any neglect, inattention, closing of one eye,.     Activity Tolerance   Activity Tolerance Tolerate 30+ min activity without fatigue     Cognition   Overall Cognitive Status Within Functional Limits for tasks assessed   Mini Mental State Exam  Appears WFL's - pt is globally aphasic therefore hard to assess. WIll monitor via functional tasks.  Pt's wife feels  memory is intact based on behaviors that pt exhibits at home.  Pt able to attend to OT assessment in busy gym without difficulty.      Sensation   Light Touch Impaired by gross assessment   Hot/Cold Impaired by gross assessment   Proprioception Not tested   due to global aphasia.       Coordination   Gross Motor Movements are Fluid and Coordinated Yes  except R hand   Fine Motor Movements are Fluid and Coordinated No   Coordination Pt appears to demonstrate apraxic movements of RUE     Praxis   Praxis --  to be further assessed via functional tasks.      Edema   Edema mild  wife states pt had arthritis in B hands prior     Tone   Assessment Location Right Upper Extremity     ROM / Strength   AROM / PROM / Strength AROM;PROM;Strength  AROM   Overall AROM  Deficits   Overall AROM Comments Pt AROM WFL's except half range supination, mildly decreased wrist flexion/extension, partial finger extension, 80% composite flexion.       PROM   Overall PROM  Within functional limits for tasks performed     Strength   Overall Strength Deficits   Overall Strength Comments Primarily in grip strength. See below     Hand Function   Right Hand Gross Grasp Impaired   Right Hand Grip (lbs) 10   Left Hand Gross Grasp Impaired   Left Hand Grip (lbs) 60     RUE Tone   RUE Tone Mild;Modified Ashworth     RUE Tone   Modified Ashworth Scale for Grading Hypertonia RUE Slight increase in muscle tone, manifested by a catch and release or by minimal resistance at the end of the range of motion when the affected part(s) is moved in flexion or extension                           OT Short Term Goals - 10/04/16 1645      OT SHORT TERM GOAL #1   Title Pt and wife will be mod I with HEP - 11/01/2016   Status New     OT SHORT TERM GOAL #2   Title Pt will demonstrate improved grip strength by at least 5 pounds to assist with functional activities.  (baseline= 10 pounds)   Status New     OT SHORT TERM GOAL #3   Title Pt will demonstrate ability to pick up small light weight object with 2 or 3 point pinch with min a.   Status New     OT SHORT TERM GOAL #4   Title Pt will be mod I with bathing at shower level   Status New      OT SHORT TERM GOAL #5   Title Pt will demonstrate ability to use RUE as gross assist for basic ADL tasks.    Status New           OT Long Term Goals - 10/04/16 1649      OT LONG TERM GOAL #1   Title Pt and wife will be mod I with upgraded HEP - 11/29/2016   Status New     OT LONG TERM GOAL #2   Title Pt will demonstrate increased grip strength by 8 pounds to assist with functional tasks (baseline= 10 pounds)   Status New     OT LONG TERM GOAL #3   Title Pt will demonstrate ability to use RUE as non dominant during basic ADL tasks at least 50% of the time.    Status New     OT LONG TERM GOAL #4   Title Pt will be able to eat with R hand as dominant hand    Status New     OT LONG TERM GOAL #5   Title Pt will be mod I with dressing    Status New     Long Term Additional Goals   Additional Long Term Goals Yes     OT LONG TERM GOAL #6   Title Pt will be mod I with grooming.    Status New               Plan - 10/04/16 1652    Clinical Impression Statement Pt is 61 year old male s/p L MCA CVA on 09/26/2016. Pt was hospitalized  from 09/26/2016- 10/01/2016.  Pt was then discharged home.  Pt presents with the following deficits that impact independence in ADL, IADL, leisure and work:  R dominant hemiplegia, muscle weakness, impaired sensation,decreased AROM of hand, decreased coordination, decreased functional use of RUE, global apraxia.  Pt will benefit from skilled OT to address these deficts to maximize independence.     Occupational Profile and client history currently impacting functional performance PMH: CAD, HTN, arthritis, GERD, h/o of MI. Pt also with Loop recorder.     Occupational performance deficits (Please refer to evaluation for details): ADL's;IADL's;Work;Leisure   Rehab Potential Good   Current Impairments/barriers affecting progress: apraxia, global aphasia   OT Frequency 2x / week   OT Duration 8 weeks   OT Treatment/Interventions Self-care/ADL  training;Therapeutic exercise;Neuromuscular education;DME and/or AE instruction;Passive range of motion;Manual Therapy;Splinting;Therapeutic activities;Patient/family education   Clinical Decision Making Several treatment options, min-mod task modification necessary   Consulted and Agree with Plan of Care Patient;Family member/caregiver   Family Member Consulted wife Clydie Braun      Patient will benefit from skilled therapeutic intervention in order to improve the following deficits and impairments:  Decreased coordination, Decreased range of motion, Decreased strength, Impaired UE functional use, Impaired sensation  Visit Diagnosis: Hemiplegia and hemiparesis following cerebral infarction affecting right dominant side (HCC) - Plan: Ot plan of care cert/re-cert  Muscle weakness (generalized) - Plan: Ot plan of care cert/re-cert  Other lack of coordination - Plan: Ot plan of care cert/re-cert  Other disturbances of skin sensation - Plan: Ot plan of care cert/re-cert  Apraxia - Plan: Ot plan of care cert/re-cert    Problem List Patient Active Problem List   Diagnosis Date Noted  . Acute embolic stroke (HCC)   . Cerebrovascular accident (CVA) due to thrombosis of precerebral artery (HCC)   . Cerebrovascular accident (CVA) (HCC) 09/26/2016  . S/P right THA, AA 04/26/2016    Norton Pastel, OTR/L 10/04/2016, 5:04 PM  [facility] [address] [place], [place], [address] Phone: [phone]   Fax:  [phone]  Name: Jarel Cuadra MRN: [id number] Date of Birth: 1956-04-09

## 2016-10-06 ENCOUNTER — Ambulatory Visit: Payer: BLUE CROSS/BLUE SHIELD | Admitting: Speech Pathology

## 2016-10-06 DIAGNOSIS — I69820 Aphasia following other cerebrovascular disease: Secondary | ICD-10-CM

## 2016-10-06 DIAGNOSIS — I69351 Hemiplegia and hemiparesis following cerebral infarction affecting right dominant side: Secondary | ICD-10-CM | POA: Diagnosis not present

## 2016-10-06 DIAGNOSIS — I69890 Apraxia following other cerebrovascular disease: Secondary | ICD-10-CM

## 2016-10-06 NOTE — Therapy (Signed)
[facility] [address] [place], [place], [address] Phone: [phone]   Fax:  [phone]  Speech Language Pathology Treatment  Patient Details  Name: Johnny Sullivan MRN: [id number] Date of Birth: 1955/11/20 Referring Provider: Dr. Rosalin Hawking  Encounter Date: 10/06/2016      End of Session - 10/06/16 1214    Visit Number 2   Number of Visits 17   Date for SLP Re-Evaluation 12/02/16   SLP Start Time 0933   SLP Stop Time  1016   SLP Time Calculation (min) 43 min   Activity Tolerance Patient tolerated treatment well      Past Medical History:  Diagnosis Date  . Arthritis    Osteoarthritis- right hip  . Coronary artery disease   . GERD (gastroesophageal reflux disease)   . Hypertension   . Myocardial infarction ([place])    " mild"-Dr. Debbora Lacrosse follows -gives clearance.  Marland Kitchen Urethral stricture    'uses self [place] caths weekly" to correct.    Past Surgical History:  Procedure Laterality Date  . BACK SURGERY     lumbar discectomy  . CARDIAC CATHETERIZATION    . CORONARY ARTERY BYPASS GRAFT     09-20-2012 x3 -[place]  . IR PERCUTANEOUS ART THROMBECTOMY/INFUSION INTRACRANIAL INC DIAG ANGIO  09/26/2016  . LOOP RECORDER INSERTION N/A 09/30/2016   Procedure: Loop Recorder Insertion;  Surgeon: Thompson Grayer, MD;  Location: [place] CV LAB;  Service: Cardiovascular;  Laterality: N/A;  . RADIOLOGY WITH ANESTHESIA N/A 09/26/2016   Procedure: RADIOLOGY WITH ANESTHESIA;  Surgeon: Luanne Bras, MD;  Location: [place];  Service: Radiology;  Laterality: N/A;  . TEE WITHOUT CARDIOVERSION N/A 09/30/2016   Procedure: TRANSESOPHAGEAL ECHOCARDIOGRAM (TEE);  Surgeon: Larey Dresser, MD;  Location: [facility] ENDOSCOPY;  Service: Cardiovascular;  Laterality: N/A;  . TOTAL HIP ARTHROPLASTY Right 04/26/2016   Procedure: RIGHT TOTAL HIP ARTHROPLASTY ANTERIOR APPROACH;  Surgeon: Paralee Cancel, MD;  [facility]: [facility]  ORS;  Service: Orthopedics;  Laterality: Right;  Marland Kitchen VASECTOMY    . WRIST SURGERY     "implant of plastic bone"-mild ROM limits    There were no vitals filed for this visit.      Subjective Assessment - 10/06/16 0939    Subjective "he said "one" yesterday" (pt's son)   Patient is accompained by: Family member   Currently in Pain? No/denies               ADULT SLP TREATMENT - 10/06/16 0941      General Information   Behavior/Cognition Alert;Cooperative;Pleasant mood   Patient Positioning Upright in chair   Oral care provided N/A     Treatment Provided   Treatment provided Cognitive-Linquistic     Pain Assessment   Pain Assessment No/denies pain     Cognitive-Linquistic Treatment   Treatment focused on Apraxia;Aphasia   Skilled Treatment Educated pt and son re: results of evaluation and goals of care. Continued assessment of reading/writing capabilities. Pt correctly identifies written name of pictured objects from BNT in 12/15 items from a field of 4, and self-corrects errors with extended time. Identification of verbally presented numbers, letters 0/4. Multisentence level reading comprehension 2/3, sentence level fill in the blank 3/4. SLP provided training in use of simple communication board with numbers, letters, Y/N. Pt used the communication board to spell his own name, approximate spelling of son's name, and correctly indicates his birth date with min A. Provided education via handout and explanation to pt and son, an ICU nurse  at [employer], re: neuroplasticity and activities for home.      Assessment / Recommendations / Plan   Plan Continue with current plan of care     Progression Toward Goals   Progression toward goals Progressing toward goals          SLP Education - 10/06/16 1213    Education provided Yes   Education Details neuroplasticity, expressive/receptive/apraxia activities for home   Person(s) Educated Patient;Child(ren)   Methods  Explanation;Demonstration;Verbal cues   Comprehension Verbalized understanding;Need further instruction          SLP Short Term Goals - 10/06/16 1216      SLP SHORT TERM GOAL #1   Title Pt will answer simple yes/no questions accurately, either by head nod, pointing to the correct word, or verbalizing "yes" or "no"   Time 4   Period Weeks   Status On-going     SLP SHORT TERM GOAL #2   Title Pt will follow 1-step verbal directions consistently, given mod (verbal/visual/tactile) cues.   Time 4   Period Weeks   Status On-going     SLP SHORT TERM GOAL #3   Title Pt will imitate 3 sounds accurately given max visual and verbal cues   Baseline able to imitate only /ma/   Time 4   Period Weeks   Status On-going     SLP SHORT TERM GOAL #4   Title Pt will participate in further assessment of reading comprehension and written expression   Baseline able to point to single word, field of 2   Time 4   Period Weeks   Status Partially Met          SLP Long Term Goals - 10/06/16 1216      SLP LONG TERM GOAL #1   Title Pt will answer complex yes/no questions accurately, either by head nod, pointing to the correct word, or verbalizing "yes" or "no"   Time 8   Period Weeks   Status On-going     SLP LONG TERM GOAL #2   Title Pt will follow 2-step verbal directions consistently, given min (verbal only) cues.   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 Verbal expression is unable to be tested at this time due to the severity of pt's apraxia. Pt would benefit from skilled ST intervention to maximize  functional and effective communication, decrease caregiver burden, and improve quality of life.    Speech Therapy Frequency 2x / week   Treatment/Interventions Cueing hierarchy;SLP instruction and feedback;Language facilitation;Environmental controls;Compensatory techniques;Functional tasks;Compensatory strategies;Multimodal communcation approach;Patient/family education   Potential to Achieve Goals Good   Potential Considerations Ability to learn/carryover information;Family/community support;Previous level of function;Cooperation/participation level   SLP Home Exercise Plan reviewed and provided   Consulted and Agree with Plan of Care Patient;Family member/caregiver   Family Member Consulted son Thurmond Butts      Patient will benefit from skilled therapeutic intervention in order to improve the following deficits and impairments:   Apraxia following other cerebrovascular disease  Aphasia following other  cerebrovascular disease    Problem List Patient Active Problem List   Diagnosis Date Noted  . Acute embolic stroke ([place])   . Cerebrovascular accident (CVA) due to thrombosis of precerebral artery ([place])   . Cerebrovascular accident (CVA) ([place]) 09/26/2016  . S/P right THA, AA 04/26/2016    Aliene Altes 10/06/2016, 12:17 PM  [facility] [address] [place], [place], [address] Phone: [phone]   Fax:  [employer], [place], [place] Speech-Language Pathologist  Name: Johnny Sullivan MRN: [id number] Date of Birth: 1955-09-26

## 2016-10-11 ENCOUNTER — Ambulatory Visit: Payer: BLUE CROSS/BLUE SHIELD | Attending: Neurology | Admitting: Occupational Therapy

## 2016-10-11 ENCOUNTER — Encounter: Payer: Self-pay | Admitting: Occupational Therapy

## 2016-10-11 ENCOUNTER — Ambulatory Visit: Payer: BLUE CROSS/BLUE SHIELD | Admitting: *Deleted

## 2016-10-11 ENCOUNTER — Telehealth: Payer: Self-pay

## 2016-10-11 DIAGNOSIS — I69351 Hemiplegia and hemiparesis following cerebral infarction affecting right dominant side: Secondary | ICD-10-CM | POA: Insufficient documentation

## 2016-10-11 DIAGNOSIS — R278 Other lack of coordination: Secondary | ICD-10-CM | POA: Diagnosis present

## 2016-10-11 DIAGNOSIS — R482 Apraxia: Secondary | ICD-10-CM | POA: Insufficient documentation

## 2016-10-11 DIAGNOSIS — I69890 Apraxia following other cerebrovascular disease: Secondary | ICD-10-CM

## 2016-10-11 DIAGNOSIS — M6281 Muscle weakness (generalized): Secondary | ICD-10-CM | POA: Diagnosis present

## 2016-10-11 DIAGNOSIS — I69820 Aphasia following other cerebrovascular disease: Secondary | ICD-10-CM | POA: Insufficient documentation

## 2016-10-11 DIAGNOSIS — R208 Other disturbances of skin sensation: Secondary | ICD-10-CM | POA: Diagnosis present

## 2016-10-11 DIAGNOSIS — Z0289 Encounter for other administrative examinations: Secondary | ICD-10-CM

## 2016-10-11 NOTE — Patient Instructions (Signed)
1. Grip Strengthening (Resistive Putty)  Yellow    Squeeze putty using thumb and all fingers. Repeat _20___ times. Do __2__ sessions per day.       Copyright  VHI. All rights reserved.

## 2016-10-11 NOTE — Therapy (Signed)
[facility] [address] [place], [place], [address] Phone: [phone]   Fax:  [phone]  Speech Language Pathology Treatment  Patient Details  Name: Johnny Sullivan MRN: [id number] Date of Birth: 09-01-1955 Referring Provider: Dr. Rosalin Hawking  Encounter Date: 10/11/2016      End of Session - 10/11/16 1456    Visit Number 3   Number of Visits 17   Date for SLP Re-Evaluation 12/02/16   SLP Start Time 0315   SLP Stop Time  1445   SLP Time Calculation (min) 40 min   Activity Tolerance Patient tolerated treatment well      Past Medical History:  Diagnosis Date  . Arthritis    Osteoarthritis- right hip  . Coronary artery disease   . GERD (gastroesophageal reflux disease)   . Hypertension   . Myocardial infarction ([place])    " mild"-Dr. Debbora Lacrosse follows -gives clearance.  Marland Kitchen Urethral stricture    'uses self [place] caths weekly" to correct.    Past Surgical History:  Procedure Laterality Date  . BACK SURGERY     lumbar discectomy  . CARDIAC CATHETERIZATION    . CORONARY ARTERY BYPASS GRAFT     09-20-2012 x3 [place]  . IR PERCUTANEOUS ART THROMBECTOMY/INFUSION INTRACRANIAL INC DIAG ANGIO  09/26/2016  . LOOP RECORDER INSERTION N/A 09/30/2016   Procedure: Loop Recorder Insertion;  Surgeon: Thompson Grayer, MD;  Location: [facility];  Service: Cardiovascular;  Laterality: N/A;  . RADIOLOGY WITH ANESTHESIA N/A 09/26/2016   Procedure: RADIOLOGY WITH ANESTHESIA;  Surgeon: Luanne Bras, MD;  Location: [facility];  Service: Radiology;  Laterality: N/A;  . TEE WITHOUT CARDIOVERSION N/A 09/30/2016   Procedure: TRANSESOPHAGEAL ECHOCARDIOGRAM (TEE);  Surgeon: Larey Dresser, MD;  Location: [facility];  Service: Cardiovascular;  Laterality: N/A;  . TOTAL HIP ARTHROPLASTY Right 04/26/2016   Procedure: RIGHT TOTAL HIP ARTHROPLASTY ANTERIOR APPROACH;  Surgeon: Paralee Cancel, MD;  Location: [facility];   Service: Orthopedics;  Laterality: Right;  Marland Kitchen VASECTOMY    . WRIST SURGERY     "implant of plastic bone"-mild ROM limits    There were no vitals filed for this visit.      Subjective Assessment - 10/11/16 1452    Subjective Son Jenny Reichmann present.    Patient is accompained by: Family member   Currently in Pain? No/denies               ADULT SLP TREATMENT - 10/11/16 0001      General Information   Behavior/Cognition Alert;Cooperative;Pleasant mood     Treatment Provided   Treatment provided Cognitive-Linquistic     Pain Assessment   Pain Assessment No/denies pain     Cognitive-Linquistic Treatment   Treatment focused on Aphasia;Apraxia;Patient/family/caregiver education   Skilled Treatment Skilled ST session focused on practice of 9 articulatory positions from the Apraxia self cueing program. Pt was able to produce 7/9, given max visual and verbal cues. Pictures and graph were provided for practice at home. Pt able to produce vowel sound /ah/, but was not stimulable for any others. Family was encouraged to bring pictures of family members for use in therapy.      Assessment / Recommendations / Plan   Plan Continue with current plan of care     Progression Toward Goals   Progression toward goals Progressing toward goals          SLP Education - 10/11/16 1455    Education provided Yes   Education Details activities for  home, apraxia self cueing program   Person(s) Educated Child(ren);Patient   Methods Explanation;Demonstration;Tactile cues;Verbal cues;Handout   Comprehension Need further instruction;Returned demonstration;Verbal cues required;Tactile cues required          SLP Short Term Goals - 10/11/16 1459      SLP SHORT TERM GOAL #1   Title Pt will answer simple yes/no questions accurately, either by head nod, pointing to the correct word, or verbalizing "yes" or "no"   Time 3   Period Weeks   Status On-going     SLP SHORT TERM GOAL #2   Title Pt will  follow 1-step verbal directions consistently, given mod (verbal/visual/tactile) cues.   Time 3   Period Weeks   Status On-going     SLP SHORT TERM GOAL #3   Title Pt will imitate 3 sounds accurately given max visual and verbal cues   Time 3   Period Weeks   Status On-going     SLP SHORT TERM GOAL #4   Title Pt will participate in further assessment of reading comprehension and written expression   Time 3   Period Weeks   Status Partially Met          SLP Long Term Goals - 10/11/16 1500      SLP LONG TERM GOAL #1   Title Pt will answer complex yes/no questions accurately, either by head nod, pointing to the correct word, or verbalizing "yes" or "no"   Time 7   Period Weeks   Status On-going     SLP LONG TERM GOAL #2   Title Pt will follow 2-step verbal directions consistently, given min (verbal only) cues.   Time 7   Period Weeks   Status On-going     SLP LONG TERM GOAL #3   Title Pt will imitate 5 sounds accurately given max visual and verbal cues   Time 7   Period Weeks   Status On-going          Plan - 10/11/16 1456    Clinical Impression Statement Pt motivated and hard working in therapy. Pt able to produce 7/9 articulatory positions with max cues required. Vowel production includes /ah/ only. Pt's son indicates family is motivated to assist pt, but pt gets frustrated easily. Continued ST intervention is recommended to maximize functional and effective communication. Recommend increase in frequency to 3/week due to severity of pt deficits.   Speech Therapy Frequency 3x / week   Duration --  8 weeks   Treatment/Interventions Cueing hierarchy;SLP instruction and feedback;Language facilitation;Environmental controls;Functional tasks;Compensatory strategies;Multimodal communcation approach;Patient/family education;Compensatory techniques   Potential to Achieve Goals Good   Potential Considerations Ability to learn/carryover information;Family/community  support;Previous level of function;Cooperation/participation level   SLP Home Exercise Plan reviewed and provided   Consulted and Agree with Plan of Care Patient;Family member/caregiver   Family Member Consulted son, Jenny Reichmann      Patient will benefit from skilled therapeutic intervention in order to improve the following deficits and impairments:   Apraxia following other cerebrovascular disease  Aphasia following other cerebrovascular disease    Problem List Patient Active Problem List   Diagnosis Date Noted  . Acute embolic stroke ([place])   . Cerebrovascular accident (CVA) due to thrombosis of precerebral artery ([place])   . Cerebrovascular accident (CVA) ([place]) 09/26/2016  . S/P right THA, AA 04/26/2016   Senai Kingsley B. Redwood, MSP, CCC-SLP  Shonna Chock 10/11/2016, 3:08 PM  [facility] [address] [place], [place],  [address] Phone: [phone]   Fax:  [phone]   Name: Paeton Studer MRN: [id number] Date of Birth: 1955-10-07

## 2016-10-11 NOTE — Therapy (Signed)
[facility] [address] [place], [place], [address] Phone: [phone]   Fax:  [phone]  Occupational Therapy Treatment  Patient Details  Name: Johnny Sullivan MRN: [id number] Date of Birth: 12/16/1955 Referring Provider: Dr. Roda ShuttersXu  Encounter Date: 10/11/2016      OT End of Session - 10/11/16 1627    Visit Number 2   Number of Visits 16   Date for OT Re-Evaluation 11/29/16   Authorization Type BCBS- await clarification of visit limitations   OT Start Time 1316   OT Stop Time 1359   OT Time Calculation (min) 43 min   Activity Tolerance Patient tolerated treatment well      Past Medical History:  Diagnosis Date  . Arthritis    Osteoarthritis- right hip  . Coronary artery disease   . GERD (gastroesophageal reflux disease)   . Hypertension   . Myocardial infarction ([facility])    " mild"-Dr. Abbey ChattersZakhary,cardiolgy-[place] follows -gives clearance.  Marland Kitchen. Urethral stricture    'uses self [place] caths weekly" to correct.    Past Surgical History:  Procedure Laterality Date  . BACK SURGERY     lumbar discectomy  . CARDIAC CATHETERIZATION    . CORONARY ARTERY BYPASS GRAFT     09-20-2012 x3 -[place]  . IR PERCUTANEOUS ART THROMBECTOMY/INFUSION INTRACRANIAL INC DIAG ANGIO  09/26/2016  . LOOP RECORDER INSERTION N/A 09/30/2016   Procedure: Loop Recorder Insertion;  Surgeon: Hillis RangeAllred, James, MD;  [facility];  Service: Cardiovascular;  Laterality: N/A;  . RADIOLOGY WITH ANESTHESIA N/A 09/26/2016   Procedure: RADIOLOGY WITH ANESTHESIA;  Surgeon: Julieanne Cottoneveshwar, Sanjeev, MD;  Location: [facility];  Service: Radiology;  Laterality: N/A;  . TEE WITHOUT CARDIOVERSION N/A 09/30/2016   Procedure: TRANSESOPHAGEAL ECHOCARDIOGRAM (TEE);  Surgeon: Laurey MoraleMcLean, Dalton S, MD;  [facility];  Service: Cardiovascular;  Laterality: N/A;  . TOTAL HIP ARTHROPLASTY Right 04/26/2016   Procedure: RIGHT TOTAL HIP ARTHROPLASTY ANTERIOR  APPROACH;  Surgeon: Durene RomansMatthew Olin, MD;  Location: [facility];  Service: Orthopedics;  Laterality: Right;  Marland Kitchen. VASECTOMY    . WRIST SURGERY     "implant of plastic bone"-mild ROM limits    There were no vitals filed for this visit.      Subjective Assessment - 10/11/16 1316    Subjective  Hey when therapist said hello   Patient is accompained by: Family member  son Jonny RuizJohn   Pertinent History see epic . 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   Patient Stated Goals unable to state - pointed to right hand                       OT Treatments/Exercises (OP) - 10/11/16 0001      ADLs   ADL Comments Reviewed goals and POC  with pt and son- pt and son in agreement.  Provided written copy.       Neurological Re-education Exercises   Other Exercises 1 Neuro re ed to address RUE hand for finger extension, 2 and 3 point pinch all thru functional tasks.  Pt with apraxia when attempting to use R hand however with hand over hand initially pt able to return demonstrate movement.  Constrained ring and pinky finger to discourage use of gross grasp and encourage pinch - pt with greatly improved performance with constraint and then repetition via functional tasks.  Pt demonstrated ability after practice to use R hand to pick up cup and drink from cup.  Also practiced eating using red foam on utensil - pt defaults to gross grasp however encouraged normal pattern to hold spoon via hand over hand and repetition.  Pt requires cues to attend to how R hand is moving and cues for hand orientation.  Feel impaired sensation is also impacting functional use.  Son is a therapist therefore recommended they do short trials of constraint induced movement for RUE. Son verbalized understanding and in agreement.  Issued putty for grip strength - pt able to return demonstrate after repetition and hand over hand - son to assist prn.                 OT Education - 10/11/16 1626    Education  provided Yes   Education Details putty for RUE - issued both yellow and pink with instructions to start with yellow   Person(s) Educated Patient;Child(ren)   Methods Explanation;Demonstration;Handout;Tactile cues   Comprehension Verbalized understanding;Returned demonstration  son to assist prn          OT Short Term Goals - 10/11/16 1622      OT SHORT TERM GOAL #1   Title Pt and wife will be mod I with HEP - 11/01/2016   Status On-going     OT SHORT TERM GOAL #2   Title Pt will demonstrate improved grip strength by at least 5 pounds to assist with functional activities.  (baseline= 10 pounds)   Status On-going     OT SHORT TERM GOAL #3   Title Pt will demonstrate ability to pick up small light weight object with 2 or 3 point pinch with min a.   Status On-going     OT SHORT TERM GOAL #4   Title Pt will be mod I with bathing at shower level   Status On-going     OT SHORT TERM GOAL #5   Title Pt will demonstrate ability to use RUE as gross assist for basic ADL tasks.    Status On-going           OT Long Term Goals - 10/11/16 1622      OT LONG TERM GOAL #1   Title Pt and wife will be mod I with upgraded HEP - 11/29/2016   Status On-going     OT LONG TERM GOAL #2   Title Pt will demonstrate increased grip strength by 8 pounds to assist with functional tasks (baseline= 10 pounds)   Status On-going     OT LONG TERM GOAL #3   Title Pt will demonstrate ability to use RUE as non dominant during basic ADL tasks at least 50% of the time.    Status On-going     OT LONG TERM GOAL #4   Title Pt will be able to eat with R hand as dominant hand    Status On-going     OT LONG TERM GOAL #5   Title Pt will be mod I with dressing    Status On-going     OT LONG TERM GOAL #6   Title Pt will be mod I with grooming.    Status On-going               Plan - 10/11/16 1623    Clinical Impression Statement Pt making excellent progress toward goals .  Pt with improved R hand  function withing context of functional tasks.   Rehab Potential Good   Current Impairments/barriers affecting progress: apraxia, global aphasia   OT Frequency 2x / week  OT Duration 8 weeks   OT Treatment/Interventions Self-care/ADL training;Therapeutic exercise;Neuromuscular education;DME and/or AE instruction;Passive range of motion;Manual Therapy;Splinting;Therapeutic activities;Patient/family education   Plan NMR for R hand function via functional tasks, constrain L hand.  grip strength,    Consulted and Agree with Plan of Care Patient;Family member/caregiver   Family Member Consulted son [name]      Patient will benefit from skilled therapeutic intervention in order to improve the following deficits and impairments:  Decreased coordination, Decreased range of motion, Decreased strength, Impaired UE functional use, Impaired sensation  Visit Diagnosis: Hemiplegia and hemiparesis following cerebral infarction affecting right dominant side ([facility])  Muscle weakness (generalized)  Other lack of coordination  Other disturbances of skin sensation  Apraxia    Problem List Patient Active Problem List   Diagnosis Date Noted  . Acute embolic stroke ([facility])   . Cerebrovascular accident (CVA) due to thrombosis of precerebral artery ([facility])   . Cerebrovascular accident (CVA) ([facility]) 09/26/2016  . S/P right THA, AA 04/26/2016    Norton Pastel, OTR/L 10/11/2016, 4:29 PM  [facility] [address] [place], [place], [address] Phone: [phone]   Fax:  [phone]  Name: Johnny Sullivan MRN: [id number] Date of Birth: 1955-09-21

## 2016-10-11 NOTE — Patient Instructions (Addendum)
Oral strengthening exercises: 1. Chewing gum 2. Moving hard candy from one location to another in the mouth 3. Drinking something THICK (thick milkshake, pudding, applesauce, etc) 4. Blowing up balloons  Apraxia: Keep it simple Practice 9 positions with the cards and a mirror handy Practice vowels (we started with /ah/, /ee/, eye/) Encourage social exchanges (hi, bye, hello, fine, OK) Practice "real" words you hear (no, one)  Think of words, phrases, high communication needs Bring pictures of family members, friends, places - labeled

## 2016-10-11 NOTE — Telephone Encounter (Signed)
Patients wife submitted a FMLA form for her job. PT needs to schedule appt before forms can be fill out. APPt is needed for hospital follow up.

## 2016-10-13 ENCOUNTER — Ambulatory Visit (INDEPENDENT_AMBULATORY_CARE_PROVIDER_SITE_OTHER): Payer: Self-pay | Admitting: *Deleted

## 2016-10-13 ENCOUNTER — Encounter: Payer: Self-pay | Admitting: Occupational Therapy

## 2016-10-13 ENCOUNTER — Ambulatory Visit: Payer: BLUE CROSS/BLUE SHIELD | Admitting: Occupational Therapy

## 2016-10-13 ENCOUNTER — Ambulatory Visit: Payer: BLUE CROSS/BLUE SHIELD | Admitting: Speech Pathology

## 2016-10-13 DIAGNOSIS — R482 Apraxia: Secondary | ICD-10-CM

## 2016-10-13 DIAGNOSIS — R278 Other lack of coordination: Secondary | ICD-10-CM

## 2016-10-13 DIAGNOSIS — I69820 Aphasia following other cerebrovascular disease: Secondary | ICD-10-CM

## 2016-10-13 DIAGNOSIS — I639 Cerebral infarction, unspecified: Secondary | ICD-10-CM

## 2016-10-13 DIAGNOSIS — M6281 Muscle weakness (generalized): Secondary | ICD-10-CM

## 2016-10-13 DIAGNOSIS — R208 Other disturbances of skin sensation: Secondary | ICD-10-CM

## 2016-10-13 DIAGNOSIS — I69351 Hemiplegia and hemiparesis following cerebral infarction affecting right dominant side: Secondary | ICD-10-CM

## 2016-10-13 DIAGNOSIS — I69890 Apraxia following other cerebrovascular disease: Secondary | ICD-10-CM

## 2016-10-13 LAB — CUP PACEART INCLINIC DEVICE CHECK
Implantable Pulse Generator Implant Date: 20180525
MDC IDC SESS DTM: 20180607124444

## 2016-10-13 NOTE — Progress Notes (Signed)
Wound check appointment. Steri-strips removed by patient prior to appointment. Wound without redness or edema. Incision edges approximated, wound well healed. Battery status: GOOD. R-waves 0.6126mV. 0 symptom episodes, 0 tachy episodes, 0 pause episodes, 0 brady episodes. 0 AF episodes (0% burden). Patient educated about wound care, signs and symptoms of infection. Monthly summary reports and ROV with JA PRN.

## 2016-10-13 NOTE — Therapy (Signed)
[facility] [address] [place], [place], [address] Phone: [phone]   Fax:  [phone]  Occupational Therapy Treatment  Patient Details  Name: Johnny Sullivan MRN: [id number] Date of Birth: Apr 29, 1956 Referring Provider: [name]  Encounter Date: 10/13/2016      OT End of Session - 10/13/16 1208    Visit Number 3   Number of Visits 16   Date for OT Re-Evaluation 11/29/16   Authorization Type BCBS- no visit limitations   OT Start Time 1103   OT Stop Time 1146   OT Time Calculation (min) 43 min   Activity Tolerance Patient tolerated treatment well      Past Medical History:  Diagnosis Date  . Arthritis    Osteoarthritis- right hip  . Coronary artery disease   . GERD (gastroesophageal reflux disease)   . Hypertension   . Myocardial infarction ([facility])    " mild"-Dr. Abbey Chatters follows -gives clearance.  Marland Kitchen Urethral stricture    'uses self [place] caths weekly" to correct.    Past Surgical History:  Procedure Laterality Date  . BACK SURGERY     lumbar discectomy  . CARDIAC CATHETERIZATION    . CORONARY ARTERY BYPASS GRAFT     09-20-2012 x3 -[place]  . IR PERCUTANEOUS ART THROMBECTOMY/INFUSION INTRACRANIAL INC DIAG ANGIO  09/26/2016  . LOOP RECORDER INSERTION N/A 09/30/2016   Procedure: Loop Recorder Insertion;  Surgeon: Hillis Range, MD;  [facility];  Service: Cardiovascular;  Laterality: N/A;  . RADIOLOGY WITH ANESTHESIA N/A 09/26/2016   Procedure: RADIOLOGY WITH ANESTHESIA;  Surgeon: Julieanne Cotton, MD;  [facility];  Service: Radiology;  Laterality: N/A;  . TEE WITHOUT CARDIOVERSION N/A 09/30/2016   Procedure: TRANSESOPHAGEAL ECHOCARDIOGRAM (TEE);  Surgeon: Laurey Morale, MD;  [facility];  Service: Cardiovascular;  Laterality: N/A;  . TOTAL HIP ARTHROPLASTY Right 04/26/2016   Procedure: RIGHT TOTAL HIP ARTHROPLASTY ANTERIOR APPROACH;  Surgeon:  Durene Romans, MD;  [facility];  Service: Orthopedics;  Laterality: Right;  Marland Kitchen VASECTOMY    . WRIST SURGERY     "implant of plastic bone"-mild ROM limits    There were no vitals filed for this visit.      Subjective Assessment - 10/13/16 1104    Subjective  Pt nodding yes appropriately to simple questions   Patient is accompained by: Family member  dtr   Pertinent History see epic . 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   Patient Stated Goals unable to state - pointed to right hand    Currently in Pain? No/denies                      OT Treatments/Exercises (OP) - 10/13/16 0001      Neurological Re-education Exercises   Other Exercises 1 Neuro re ed to address R hand functional use for 2 point, 3 point pinch as well as for writing with large marker with coban and eating with a spoon. Pt with significant apraxia when attempting activites that require more skilled hand movements however with structure and practice/repetition pt improves in single session.  Also addressed grip strength with functional activity as well. Pt able to trace circles and write name with adapted marker.                   OT Short Term Goals - 10/13/16 1207      OT SHORT TERM GOAL #1   Title  Pt and wife will be mod I with HEP - 11/01/2016   Status On-going     OT SHORT TERM GOAL #2   Title Pt will demonstrate improved grip strength by at least 5 pounds to assist with functional activities.  (baseline= 10 pounds)   Status On-going     OT SHORT TERM GOAL #3   Title Pt will demonstrate ability to pick up small light weight object with 2 or 3 point pinch with min a.   Status On-going     OT SHORT TERM GOAL #4   Title Pt will be mod I with bathing at shower level   Status On-going     OT SHORT TERM GOAL #5   Title Pt will demonstrate ability to use RUE as gross assist for basic ADL tasks.    Status On-going           OT Long Term Goals - 10/13/16 1207       OT LONG TERM GOAL #1   Title Pt and wife will be mod I with upgraded HEP - 11/29/2016   Status On-going     OT LONG TERM GOAL #2   Title Pt will demonstrate increased grip strength by 8 pounds to assist with functional tasks (baseline= 10 pounds)   Status On-going     OT LONG TERM GOAL #3   Title Pt will demonstrate ability to use RUE as non dominant during basic ADL tasks at least 50% of the time.    Status On-going     OT LONG TERM GOAL #4   Title Pt will be able to eat with R hand as dominant hand    Status On-going     OT LONG TERM GOAL #5   Title Pt will be mod I with dressing    Status On-going     OT LONG TERM GOAL #6   Title Pt will be mod I with grooming.    Status On-going               Plan - 10/13/16 1207    Clinical Impression Statement Pt making great progress toward goals. Pt with significant apraxia with R hand for more skilled activities.    Rehab Potential Good   Current Impairments/barriers affecting progress: apraxia, global aphasia   OT Frequency 2x / week   OT Duration 8 weeks   OT Treatment/Interventions Self-care/ADL training;Therapeutic exercise;Neuromuscular education;DME and/or AE instruction;Passive range of motion;Manual Therapy;Splinting;Therapeutic activities;Patient/family education   Plan NMR for R hand function via functional tasks, constrain L hand, grip strength, introduce functional bilateral activities.    Consulted and Agree with Plan of Care Patient;Family member/caregiver   Family Member Consulted sister      Patient will benefit from skilled therapeutic intervention in order to improve the following deficits and impairments:  Decreased coordination, Decreased range of motion, Decreased strength, Impaired UE functional use, Impaired sensation  Visit Diagnosis: Hemiplegia and hemiparesis following cerebral infarction affecting right dominant side (HCC)  Muscle weakness (generalized)  Other lack of coordination  Other  disturbances of skin sensation  Apraxia    Problem List Patient Active Problem List   Diagnosis Date Noted  . Acute embolic stroke ([facility])   . Cerebrovascular accident (CVA) due to thrombosis of precerebral artery ([facility])   . Cerebrovascular accident (CVA) ([facility]) 09/26/2016  . S/P right THA, AA 04/26/2016    Norton PastelPulaski, Karen Halliday, OTR/L 10/13/2016, 12:10 PM  [facility] [address]  [place], [place], [address] Phone: [phone]   Fax:  [phone]  Name: Johnny Sullivan MRN: [id number] Date of Birth: 02-06-1956

## 2016-10-13 NOTE — Therapy (Signed)
John[place] [address] [place], [place], [address] Phone: [phone]   Fax:  [phone]  Speech Language Pathology Treatment  Patient Details  Name: Johnny Sullivan MRN: [id number] Date of Birth: 05/31/1955 Referring Provider: Dr. Rosalin Hawking  Encounter Date: 10/13/2016      End of Session - 10/13/16 1831    Visit Number 4   Number of Visits 17   Date for SLP Re-Evaluation 12/02/16   SLP Start Time 1017   SLP Stop Time  1101   SLP Time Calculation (min) 44 min   Activity Tolerance Patient tolerated treatment well      Past Medical History:  Diagnosis Date  . Arthritis    Osteoarthritis- right hip  . Coronary artery disease   . GERD (gastroesophageal reflux disease)   . Hypertension   . Myocardial infarction ([place])    " mild"-Dr. Debbora Lacrosse follows -gives clearance.  Marland Kitchen Urethral stricture    'uses self Johnny [place] caths weekly" to correct.    Past Surgical History:  Procedure Laterality Date  . BACK SURGERY     lumbar discectomy  . CARDIAC CATHETERIZATION    . CORONARY ARTERY BYPASS GRAFT     09-20-2012 x3 -[place]  . IR PERCUTANEOUS ART THROMBECTOMY/INFUSION INTRACRANIAL INC DIAG ANGIO  09/26/2016  . LOOP RECORDER INSERTION N/A 09/30/2016   Procedure: Loop Recorder Insertion;  Surgeon: Thompson Grayer, MD;  Location: [facility];  Service: Cardiovascular;  Laterality: N/A;  . RADIOLOGY WITH ANESTHESIA N/A 09/26/2016   Procedure: RADIOLOGY WITH ANESTHESIA;  Surgeon: Luanne Bras, MD;  Location: [facility];  Service: Radiology;  Laterality: N/A;  . TEE WITHOUT CARDIOVERSION N/A 09/30/2016   Procedure: TRANSESOPHAGEAL ECHOCARDIOGRAM (TEE);  Surgeon: Larey Dresser, MD;  Location: [facility];  Service: Cardiovascular;  Laterality: N/A;  . TOTAL HIP ARTHROPLASTY Right 04/26/2016   Procedure: RIGHT TOTAL HIP ARTHROPLASTY ANTERIOR APPROACH;  Surgeon: Paralee Cancel, MD;  Location: [facility];   Service: Orthopedics;  Laterality: Right;  Marland Kitchen VASECTOMY    . WRIST SURGERY     "implant of plastic bone"-mild ROM limits    There were no vitals filed for this visit.      Subjective Assessment - 10/13/16 1825    Subjective "[name]" (stereotypic utterance)    Patient is accompained by: Family member  Sister   Currently in Pain? No/denies               ADULT SLP TREATMENT - 10/13/16 1017      General Information   Behavior/Cognition Alert;Cooperative;Pleasant mood   Patient Positioning Upright in chair   Oral care provided N/A     Treatment Provided   Treatment provided Cognitive-Linquistic     Pain Assessment   Pain Assessment No/denies pain     Cognitive-Linquistic Treatment   Treatment focused on Aphasia;Apraxia;Patient/family/caregiver education   Skilled Treatment SLP provided education re: articulatory positions, manner of sound production and voicing, providing verbal, tactile, demonstration cues as well as biofeedback for plosive sounds. Pt's sister inquiring re: activites for home practice on tablet; introduced Constant Therapy application and provided pt with receptive language activities for home. Verbal expression tasks trialed but are too advanced given pt's severe apraxia.      Assessment / Recommendations / Plan   Plan Continue with current plan of care     Progression Toward Goals   Progression toward goals Progressing toward goals          SLP Education - 10/13/16 1830  Education provided Yes   Education Details constant therapy application, visual and tactile self-cues   Person(s) Educated Patient;Other (comment)  sister   Methods Explanation;Demonstration;Tactile cues;Verbal cues   Comprehension Verbalized understanding;Returned demonstration;Need further instruction          SLP Short Term Goals - 10/13/16 1832      SLP SHORT TERM GOAL #1   Title Pt will answer simple yes/no questions accurately, either by head nod, pointing to the  correct word, or verbalizing "yes" or "no"   Time 3   Period Weeks   Status On-going     SLP SHORT TERM GOAL #2   Title Pt will follow 1-step verbal directions consistently, given mod (verbal/visual/tactile) cues.   Time 3   Period Weeks   Status On-going     SLP SHORT TERM GOAL #3   Title Pt will imitate 3 sounds accurately given max visual and verbal cues   Time 3   Period Weeks   Status On-going     SLP SHORT TERM GOAL #4   Title Pt will participate in further assessment of reading comprehension and written expression   Time 3   Period Weeks   Status Partially Met          SLP Long Term Goals - 10/13/16 1832      SLP LONG TERM GOAL #1   Title Pt will answer complex yes/no questions accurately, either by head nod, pointing to the correct word, or verbalizing "yes" or "no"   Time 7   Period Weeks   Status On-going     SLP LONG TERM GOAL #2   Title Pt will follow 2-step verbal directions consistently, given min (verbal only) cues.   Time 7   Period Weeks   Status On-going     SLP LONG TERM GOAL #3   Title Pt will imitate 5 sounds accurately given max visual and verbal cues   Time 7   Period Weeks   Status On-going          Plan - 10/13/16 1831    Clinical Impression Statement Pt motivated and hard working in therapy. Pt able to produce 7/9 articulatory positions with max cues required. Vowel production includes /ah/ only. Pt's son indicates family is motivated to assist pt, but pt gets frustrated easily. Continued ST intervention is recommended to maximize functional and effective communication.   Speech Therapy Frequency 3x / week   Treatment/Interventions Cueing hierarchy;SLP instruction and feedback;Language facilitation;Environmental controls;Functional tasks;Compensatory strategies;Multimodal communcation approach;Patient/family education;Compensatory techniques   Potential to Achieve Goals Good   Potential Considerations Ability to learn/carryover  information;Family/community support;Previous level of function;Cooperation/participation level   SLP Home Exercise Plan reviewed and provided   Consulted and Agree with Plan of Care Patient;Family member/caregiver   Family Member Consulted sister Suanne Marker      Patient will benefit from skilled therapeutic intervention in order to improve the following deficits and impairments:   Apraxia following other cerebrovascular disease  Aphasia following other cerebrovascular disease    Problem List Patient Active Problem List   Diagnosis Date Noted  . Acute embolic stroke ([place])   . Cerebrovascular accident (CVA) due to thrombosis of precerebral artery ([place])   . Cerebrovascular accident (CVA) ([place]) 09/26/2016  . S/P right THA, AA 04/26/2016   Deneise Lever, [place], [place] 10/13/2016, 6:33 PM  [facility] [address] [place], [place], [address] Phone: [phone]   Fax:  [phone]  Name: Johnny Sullivan MRN: [id number] Date of Birth: [birth year]

## 2016-10-14 ENCOUNTER — Ambulatory Visit: Payer: BLUE CROSS/BLUE SHIELD | Admitting: *Deleted

## 2016-10-14 DIAGNOSIS — I69351 Hemiplegia and hemiparesis following cerebral infarction affecting right dominant side: Secondary | ICD-10-CM | POA: Diagnosis not present

## 2016-10-14 DIAGNOSIS — R482 Apraxia: Secondary | ICD-10-CM

## 2016-10-14 NOTE — Telephone Encounter (Signed)
APPt made with CarolynNP for JUly. FMLA form can be filled out.

## 2016-10-14 NOTE — Therapy (Signed)
[facility] [address] [place], [place], [place] Phone: [phone]   Fax:  [phone]  Speech Language Pathology Treatment  Patient Details  Name: Johnny Sullivan MRN: [id number] Date of Birth: 08/17/1955 Referring Provider: Dr. Marvel PlanJindong Xu  Encounter Date: 10/14/2016      End of Session - 10/14/16 1118    Visit Number 5   Number of Visits 17   Date for SLP Re-Evaluation 12/02/16   SLP Start Time 1020   SLP Stop Time  1105   SLP Time Calculation (min) 45 min   Activity Tolerance Patient tolerated treatment well      Past Medical History:  Diagnosis Date  . Arthritis    Osteoarthritis- right hip  . Coronary artery disease   . GERD (gastroesophageal reflux disease)   . Hypertension   . Myocardial infarction ([facility])    " mild"-Dr. Abbey ChattersZakhary,cardiolgy-[place] follows -gives clearance.  Marland Kitchen. Urethral stricture    'uses self [place] caths weekly" to correct.    Past Surgical History:  Procedure Laterality Date  . BACK SURGERY     lumbar discectomy  . CARDIAC CATHETERIZATION    . CORONARY ARTERY BYPASS GRAFT     09-20-2012 x3 -[place]  . IR PERCUTANEOUS ART THROMBECTOMY/INFUSION INTRACRANIAL INC DIAG ANGIO  09/26/2016  . LOOP RECORDER INSERTION N/A 09/30/2016   Procedure: Loop Recorder Insertion;  Surgeon: Hillis RangeAllred, James, MD;  [facility];  Service: Cardiovascular;  Laterality: N/A;  . RADIOLOGY WITH ANESTHESIA N/A 09/26/2016   Procedure: RADIOLOGY WITH ANESTHESIA;  Surgeon: Julieanne Cottoneveshwar, Sanjeev, MD;  [facility];  Service: Radiology;  Laterality: N/A;  . TEE WITHOUT CARDIOVERSION N/A 09/30/2016   Procedure: TRANSESOPHAGEAL ECHOCARDIOGRAM (TEE);  Surgeon: Laurey MoraleMcLean, Dalton S, MD;  [facility];  Service: Cardiovascular;  Laterality: N/A;  . TOTAL HIP ARTHROPLASTY Right 04/26/2016   Procedure: RIGHT TOTAL HIP ARTHROPLASTY ANTERIOR APPROACH;  Surgeon: Durene RomansMatthew Olin, MD;  [facility];   Service: Orthopedics;  Laterality: Right;  Marland Kitchen. VASECTOMY    . WRIST SURGERY     "implant of plastic bone"-mild ROM limits    There were no vitals filed for this visit.      Subjective Assessment - 10/14/16 1112    Subjective "[name]"   Patient is accompained by: Family member  sister Bjorn LoserRhonda   Currently in Pain? No/denies               ADULT SLP TREATMENT - 10/14/16 0001      General Information   Behavior/Cognition Alert;Cooperative;Pleasant mood     Treatment Provided   Treatment provided Cognitive-Linquistic     Pain Assessment   Pain Assessment No/denies pain     Cognitive-Linquistic Treatment   Treatment focused on Aphasia;Apraxia;Patient/family/caregiver education   Skilled Treatment ST session targeted assessment of written expression and apraxia treatment. Pt was able to write his name accurately, write the name of common objects with 50% correct spelling ([place], [place], Florence[place]aretin). He was able to write 1/2 colors, but was unable to write his favorite food. Goals for written comprehension and expression were added to POC. Pt able to demonstrate 9/9 articulatory positions today. CV combination with /m/ added - pt was able to approximate lip placement for /e/, /oh/ and /oo/, but had difficulty with correct tongue placement for correct vowel production. "My" was elicited intermittently, however, not on command. Pt/sister were encouraged to acquire a notebook with dividers for worksheets, functional lists, and schedule, and to continue practice at home.  Assessment / Recommendations / Plan   Plan Continue with current plan of care     Progression Toward Goals   Progression toward goals Progressing toward goals          SLP Education - 10/14/16 1117    Education provided Yes   Education Details practice writing, continue apraxia self cueing program   Person(s) Educated Patient;Other (comment)  sister [name]   Methods Explanation;Demonstration;Verbal  cues;Handout;Tactile cues   Comprehension Verbalized understanding;Returned demonstration;Need further instruction;Verbal cues required          SLP Short Term Goals - 10/14/16 1121      SLP SHORT TERM GOAL #1   Title Pt will answer simple yes/no questions accurately, either by head nod, pointing to the correct word, or verbalizing "yes" or "no"   Time 3   Period Weeks   Status On-going     SLP SHORT TERM GOAL #2   Title Pt will follow 1-step verbal directions consistently, given mod (verbal/visual/tactile) cues.   Time 3   Period Weeks   Status On-going     SLP SHORT TERM GOAL #3   Title Pt will imitate 3 sounds accurately given max visual and verbal cues   Time 3   Period Weeks   Status On-going     SLP SHORT TERM GOAL #4   Title Pt will participate in further assessment of reading comprehension and written expression   Baseline able to point to single word, field of 2, writes single words to dictation with 75% accuracy   Status Achieved     SLP SHORT TERM GOAL #5   Title Pt will demonstrate comprehension of short phrases with 80% accuracy, given mod cues   Baseline ID single word, fo2   Time 3   Period Weeks   Status New     Additional Short Term Goals   Additional Short Term Goals Yes     SLP SHORT TERM GOAL #6   Title Pt will write single words with 95% accuracy, given min cues.   Baseline writes single words to dictation with 50% accuracy   Time 3   Period Weeks   Status New          SLP Long Term Goals - 10/14/16 1124      SLP LONG TERM GOAL #1   Title Pt will answer complex yes/no questions accurately, either by head nod, pointing to the correct word, or verbalizing "yes" or "no"   Time 7   Period Weeks   Status On-going     SLP LONG TERM GOAL #2   Title Pt will follow 2-step verbal directions consistently, given min (verbal only) cues.   Time 7   Period Weeks   Status On-going     SLP LONG TERM GOAL #3   Title Pt will imitate 5 sounds  accurately given max visual and verbal cues   Time 7   Period Weeks   Status On-going     SLP LONG TERM GOAL #4   Title Pt will demonstrate comprehension of sentence length material with 80% accuracy   Time 7   Period Weeks   Status New     SLP LONG TERM GOAL #5   Title Pt will write short phrases with 80% accuracy   Time 7   Period Weeks   Status New          Plan - 10/14/16 1118    Clinical Impression Statement Pt continues to be motivated. Sister reports  family is working diligently with pt at home. Improved approximation of vowels (appropriate lip position). Pt able to write words, which may facilitate more effective communication. Continued skilled ST intervention is recommended to maximize communication.    Speech Therapy Frequency 3x / week   Duration --  8 weeks   Treatment/Interventions Cueing hierarchy;SLP instruction and feedback;Language facilitation;Environmental controls;Functional tasks;Compensatory strategies;Multimodal communcation approach;Patient/family education;Compensatory techniques   Potential to Achieve Goals Good   Potential Considerations Ability to learn/carryover information;Family/community support;Previous level of function;Cooperation/participation level;Severity of impairments   SLP Home Exercise Plan reviewed and provided   Consulted and Agree with Plan of Care Patient;Family member/caregiver   Family Member Consulted sister [name]      Patient will benefit from skilled therapeutic intervention in order to improve the following deficits and impairments:   Apraxia    Problem List Patient Active Problem List   Diagnosis Date Noted  . Acute embolic stroke ([facility])   . Cerebrovascular accident (CVA) due to thrombosis of precerebral artery ([facility])   . Cerebrovascular accident (CVA) ([facility]) 09/26/2016  . S/P right THA, AA 04/26/2016   Abriel Hattery B. Unity, MSP, CCC-SLP  Leigh Aurora 10/14/2016, 11:26 AM  [facility] [address] [place], [place], [address] Phone: [phone]   Fax:  [phone]   Name: Tabari Volkert MRN: [id number] Date of Birth: Dec 20, 1955

## 2016-10-14 NOTE — Patient Instructions (Signed)
Encourage Johnny Sullivan to write lists of things that will facilitate communication:  Foods Feelings Places  Have him generate words, but also ask him to write specific things  Get a notebook with dividers to keep lists, apraxia cards, homework, schedules, etc

## 2016-10-17 ENCOUNTER — Ambulatory Visit: Payer: BLUE CROSS/BLUE SHIELD | Admitting: Speech Pathology

## 2016-10-17 ENCOUNTER — Encounter: Payer: Self-pay | Admitting: Occupational Therapy

## 2016-10-17 ENCOUNTER — Ambulatory Visit: Payer: BLUE CROSS/BLUE SHIELD | Admitting: Occupational Therapy

## 2016-10-17 DIAGNOSIS — R278 Other lack of coordination: Secondary | ICD-10-CM

## 2016-10-17 DIAGNOSIS — I69820 Aphasia following other cerebrovascular disease: Secondary | ICD-10-CM

## 2016-10-17 DIAGNOSIS — I69351 Hemiplegia and hemiparesis following cerebral infarction affecting right dominant side: Secondary | ICD-10-CM | POA: Diagnosis not present

## 2016-10-17 DIAGNOSIS — R208 Other disturbances of skin sensation: Secondary | ICD-10-CM

## 2016-10-17 DIAGNOSIS — R482 Apraxia: Secondary | ICD-10-CM

## 2016-10-17 DIAGNOSIS — M6281 Muscle weakness (generalized): Secondary | ICD-10-CM

## 2016-10-17 NOTE — Therapy (Signed)
[facility] [address] [place], [place], [address] Phone: [phone]   Fax:  [phone]  Speech Language Pathology Treatment  Patient Details  Name: Johnny Sullivan MRN: [id number] Date of Birth: [birth year] Referring Provider: Dr. Marvel Plan  Encounter Date: 10/17/2016      End of Session - 10/17/16 1308    Visit Number 6   Number of Visits 17   Date for SLP Re-Evaluation 12/02/16   SLP Start Time 1148   SLP Stop Time  1230   SLP Time Calculation (min) 42 min   Activity Tolerance Patient tolerated treatment well      Past Medical History:  Diagnosis Date  . Arthritis    Osteoarthritis- right hip  . Coronary artery disease   . GERD (gastroesophageal reflux disease)   . Hypertension   . Myocardial infarction ([facility])    " mild"-Dr. Abbey Chatters follows -gives clearance.  Marland Kitchen Urethral stricture    'uses self [place] caths weekly" to correct.    Past Surgical History:  Procedure Laterality Date  . BACK SURGERY     lumbar discectomy  . CARDIAC CATHETERIZATION    . CORONARY ARTERY BYPASS GRAFT     09-20-2012 x3 -[place]  . IR PERCUTANEOUS ART THROMBECTOMY/INFUSION INTRACRANIAL INC DIAG ANGIO  09/26/2016  . LOOP RECORDER INSERTION N/A 09/30/2016   Procedure: Loop Recorder Insertion;  Surgeon: Hillis Range, MD;  [facility];  Service: Cardiovascular;  Laterality: N/A;  . RADIOLOGY WITH ANESTHESIA N/A 09/26/2016   Procedure: RADIOLOGY WITH ANESTHESIA;  Surgeon: Julieanne Cotton, MD;  [facility];  Service: Radiology;  Laterality: N/A;  . TEE WITHOUT CARDIOVERSION N/A 09/30/2016   Procedure: TRANSESOPHAGEAL ECHOCARDIOGRAM (TEE);  Surgeon: Laurey Morale, MD;  Location[facility];  Service: Cardiovascular;  Laterality: N/A;  . TOTAL HIP ARTHROPLASTY Right 04/26/2016   Procedure: RIGHT TOTAL HIP ARTHROPLASTY ANTERIOR APPROACH;  Surgeon: Durene Romans, MD;  [facility];  Service: Orthopedics;  Laterality: Right;  Marland Kitchen VASECTOMY    . WRIST SURGERY     "implant of plastic bone"-mild ROM limits    There were no vitals filed for this visit.      Subjective Assessment - 10/17/16 1151    Subjective "[name]"   Patient is accompained by: Family member   Currently in Pain? No/denies               ADULT SLP TREATMENT - 10/17/16 1151      General Information   Behavior/Cognition Alert;Cooperative;Pleasant mood   Patient Positioning Upright in chair   Oral care provided N/A     Treatment Provided   Treatment provided Cognitive-Linquistic     Pain Assessment   Pain Assessment No/denies pain     Cognitive-Linquistic Treatment   Treatment focused on Aphasia;Apraxia;Patient/family/caregiver education   Skilled Treatment SLP targeted apraxia treatment with visual, verbal and tactile cuing for target sound productions. Pt only able to imitate nasal consonants and nasalized vowel sounds at this time. SLP attempted several strategies to cue velopharyngeal closure (review of pt's MBS shows adequate closure of soft palate during swallow) including visual feedback with pressure consonants, velar consonants, tactile-kinesthetic feedback with clinician lifting soft palate via tongue blade. Utilized nose pinch to facilitate sound awareness of /b/ vs /m/, /d/ vs /n/. Educated pt and son re: the physiology of oral resonant sound production via visual of pharynx. Provided education re: use of alternative/augmentative methods of communication to facilitate pt's expressive communication. Pt became tearful in  session, expressing frustration with difficulty imitating speech sounds. SLP provided encouragement and explained purposes of ST are not only to target speech but functional communication. Encouraged pt and his son to take photographs on his tablet of his home, friends and family, hobbies, frequently visited locations. Goal for AAC added.      Assessment /  Recommendations / Plan   Plan Goals updated     Progression Toward Goals   Progression toward goals Progressing toward goals          SLP Education - 10/17/16 1308    Education provided Yes   Education Details AAC to augment expressive communication   Person(s) Educated Patient;Child(ren)   Methods Explanation;Demonstration;Verbal cues;Handout   Comprehension Verbalized understanding;Need further instruction          SLP Short Term Goals - 10/17/16 1312      SLP SHORT TERM GOAL #1   Title Pt will answer simple yes/no questions accurately, either by head nod, pointing to the correct word, or verbalizing "yes" or "no"   Time 2   Status On-going     SLP SHORT TERM GOAL #2   Title Pt will follow 1-step verbal directions consistently, given mod (verbal/visual/tactile) cues.   Time 2   Period Weeks   Status On-going     SLP SHORT TERM GOAL #3   Title Pt will imitate 3 sounds accurately given max visual and verbal cues   Time 2   Period Weeks   Status On-going     SLP SHORT TERM GOAL #4   Title Pt will participate in further assessment of reading comprehension and written expression   Status Achieved     SLP SHORT TERM GOAL #5   Title Pt will demonstrate comprehension of short phrases with 80% accuracy, given mod cues   Time 2   Period Weeks   Status On-going     Additional Short Term Goals   Additional Short Term Goals Yes     SLP SHORT TERM GOAL #6   Title Pt will write single words with 95% accuracy, given min cues.   Time 2   Period Weeks   Status On-going     SLP SHORT TERM GOAL #7   Title Pt will demonstrate use of AAC/ multimodal communication to respond to simple questions with min-mod A.   Time 2   Period Weeks   Status New          SLP Long Term Goals - 10/17/16 1314      SLP LONG TERM GOAL #1   Title Pt will answer complex yes/no questions accurately, either by head nod, pointing to the correct word, or verbalizing "yes" or "no"   Time 6    Period Weeks   Status On-going     SLP LONG TERM GOAL #2   Title Pt will follow 2-step verbal directions consistently, given min (verbal only) cues.   Time 6   Period Weeks   Status On-going     SLP LONG TERM GOAL #3   Title Pt will imitate 5 sounds accurately given max visual and verbal cues   Time 6   Period Weeks   Status On-going     SLP LONG TERM GOAL #4   Title Pt will demonstrate comprehension of sentence length material with 80% accuracy   Time 6   Period Weeks   Status On-going     SLP LONG TERM GOAL #5   Title Pt will write short phrases with 80%  accuracy   Time 6   Period Weeks   Status On-going     Additional Long Term Goals   Additional Long Term Goals Yes     SLP LONG TERM GOAL #6   Title Pt/family will report 25% improvement in expressive communication with use of AAC/multimodal means of communication.   Time 6   Period Weeks   Status New          Plan - 10/17/16 1311    Clinical Impression Statement Pt motivated but today expresses discouragement with speech. At this time he is able to imitate nasal consonants and nasalized /a/ consistently. Provided education this session re: AAC as a means to augment pt's expressive communication. Goal added for AAC. Continued skilled ST intervention is recommended to maximize communication.    Speech Therapy Frequency 3x / week   Treatment/Interventions Cueing hierarchy;SLP instruction and feedback;Language facilitation;Environmental controls;Functional tasks;Compensatory strategies;Multimodal communcation approach;Patient/family education;Compensatory techniques   Potential to Achieve Goals Good   Potential Considerations Ability to learn/carryover information;Family/community support;Previous level of function;Cooperation/participation level;Severity of impairments   SLP Home Exercise Plan take photographs for AAC   Consulted and Agree with Plan of Care Patient;Family member/caregiver   Family Member Consulted son       Patient will benefit from skilled therapeutic intervention in order to improve the following deficits and impairments:   Apraxia  Aphasia following other cerebrovascular disease    Problem List Patient Active Problem List   Diagnosis Date Noted  . Acute embolic stroke ([facility])   . Cerebrovascular accident (CVA) due to thrombosis of precerebral artery ([facility])   . Cerebrovascular accident (CVA) ([facility]) 09/26/2016  . S/P right THA, AA 04/26/2016   Rondel BatonMary Beth Nichol Ator, MS, CCC-SLP Speech-Language Pathologist  Arlana LindauMary E Hawke Villalpando 10/17/2016, 1:16 PM   [facility] [address] [place], [place], [address] Phone: [phone]   Fax:  [phone]   Name: Read Drivershillip Schaller MRN: [id number] Date of Birth: 06/04/1955

## 2016-10-17 NOTE — Therapy (Signed)
[facility] [address] [place], [place], [address] Phone: [phone]   Fax:  [phone]  Occupational Therapy Treatment  Patient Details  Name: Johnny Sullivan MRN: [id number] Date of Birth: 11/26/1955 Referring Provider: Dr. Roda ShuttersXu  Encounter Date: 10/17/2016      OT End of Session - 10/17/16 1325    Visit Number 4   Number of Visits 16   Date for OT Re-Evaluation 11/29/16   Authorization Type BCBS- no visit limitations   OT Start Time 1102   OT Stop Time 1145   OT Time Calculation (min) 43 min   Activity Tolerance Patient tolerated treatment well      Past Medical History:  Diagnosis Date  . Arthritis    Osteoarthritis- right hip  . Coronary artery disease   . GERD (gastroesophageal reflux disease)   . Hypertension   . Myocardial infarction (HCC)    " mild"-Dr. Abbey ChattersZakhary,cardiolgy[place] -gives clearance.  Marland Kitchen. Urethral stricture    'uses self [place] caths weekly" to correct.    Past Surgical History:  Procedure Laterality Date  . BACK SURGERY     lumbar discectomy  . CARDIAC CATHETERIZATION    . CORONARY ARTERY BYPASS GRAFT     09-20-2012 x3 -[place]  . IR PERCUTANEOUS ART THROMBECTOMY/INFUSION INTRACRANIAL INC DIAG ANGIO  09/26/2016  . LOOP RECORDER INSERTION N/A 09/30/2016   Procedure: Loop Recorder Insertion;  Surgeon: Hillis RangeAllred, James, MD;  [facility];  Service: Cardiovascular;  Laterality: N/A;  . RADIOLOGY WITH ANESTHESIA N/A 09/26/2016   Procedure: RADIOLOGY WITH ANESTHESIA;  Surgeon: Julieanne Cottoneveshwar, Sanjeev, MD;  Location: [facility] OR;  Service: Radiology;  Laterality: N/A;  . TEE WITHOUT CARDIOVERSION N/A 09/30/2016   Procedure: TRANSESOPHAGEAL ECHOCARDIOGRAM (TEE);  Surgeon: Laurey MoraleMcLean, Dalton S, MD;  [facility];  Service: Cardiovascular;  Laterality: N/A;  . TOTAL HIP ARTHROPLASTY Right 04/26/2016   Procedure: RIGHT TOTAL HIP ARTHROPLASTY ANTERIOR APPROACH;  Surgeon:  Durene RomansMatthew Olin, MD;  Location: [facility] ORS;  Service: Orthopedics;  Laterality: Right;  Marland Kitchen. VASECTOMY    . WRIST SURGERY     "implant of plastic bone"-mild ROM limits    There were no vitals filed for this visit.      Subjective Assessment - 10/17/16 1107    Subjective  Uhuh when asked how he was today   Patient is accompained by: Family member  son   Pertinent History see epic . 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   Patient Stated Goals unable to state - pointed to right hand    Currently in Pain? No/denies                      OT Treatments/Exercises (OP) - 10/17/16 0001      Neurological Re-education Exercises   Other Exercises 1 Neuro re ed to address functional use of R hand with emphasis on 2 point, 3 point and lateral pinch, pinch strength, hand orientation, motor planning, gross coordination.                   OT Short Term Goals - 10/17/16 1322      OT SHORT TERM GOAL #1   Title Pt and wife will be mod I with HEP - 11/01/2016   Status On-going     OT SHORT TERM GOAL #2   Title Pt will demonstrate improved grip strength by at least 5 pounds to assist with functional activities.  (baseline= 10  pounds)   Status Achieved  35     OT SHORT TERM GOAL #3   Title Pt will demonstrate ability to pick up small light weight object with 2 or 3 point pinch with min a.   Status On-going     OT SHORT TERM GOAL #4   Title Pt will be mod I with bathing at shower level   Status On-going     OT SHORT TERM GOAL #5   Title Pt will demonstrate ability to use RUE as gross assist for basic ADL tasks.    Status On-going           OT Long Term Goals - 10/17/16 1323      OT LONG TERM GOAL #1   Title Pt and wife will be mod I with upgraded HEP - 11/29/2016   Status On-going     OT LONG TERM GOAL #2   Title Pt will demonstrate increased grip strength by 8 pounds to assist with functional tasks (baseline= 10 pounds)   Status On-going     OT  LONG TERM GOAL #3   Title Pt will demonstrate ability to use RUE as non dominant during basic ADL tasks at least 50% of the time.    Status On-going     OT LONG TERM GOAL #4   Title Pt will be able to eat with R hand as dominant hand    Status On-going     OT LONG TERM GOAL #5   Title Pt will be mod I with dressing    Status On-going     OT LONG TERM GOAL #6   Title Pt will be mod I with grooming.    Status On-going               Plan - 10/17/16 1323    Clinical Impression Statement Pt progressing toward goals. Pt with improved AROM and strength in R hand however needs max structure to carry this into functional activity due to apraxia and poor sensation.   Rehab Potential Good   Current Impairments/barriers affecting progress: apraxia, global aphasia   OT Frequency 2x / week   OT Duration 8 weeks   OT Treatment/Interventions Self-care/ADL training;Therapeutic exercise;Neuromuscular education;DME and/or AE instruction;Passive range of motion;Manual Therapy;Splinting;Therapeutic activities;Patient/family education   Plan NMR for hand function via functional tasks, constrain L hand, grip strength, bilateral functional tasks.    Consulted and Agree with Plan of Care Patient;Family member/caregiver   Family Member Consulted son      Patient will benefit from skilled therapeutic intervention in order to improve the following deficits and impairments:  Decreased coordination, Decreased range of motion, Decreased strength, Impaired UE functional use, Impaired sensation  Visit Diagnosis: Apraxia  Hemiplegia and hemiparesis following cerebral infarction affecting right dominant side (HCC)  Muscle weakness (generalized)  Other lack of coordination  Other disturbances of skin sensation    Problem List Patient Active Problem List   Diagnosis Date Noted  . Acute embolic stroke (HCC)   . Cerebrovascular accident (CVA) due to thrombosis of precerebral artery (HCC)   .  Cerebrovascular accident (CVA) (HCC) 09/26/2016  . S/P right THA, AA 04/26/2016    Norton Pastel, OTR/L 10/17/2016, 1:26 PM  [facility] [address] [place], [place], [address] Phone: [phone]   Fax:  [phone]  Name: Eliya Geiman MRN: [id number] Date of Birth: 1955-07-10

## 2016-10-18 ENCOUNTER — Ambulatory Visit: Payer: BLUE CROSS/BLUE SHIELD | Admitting: Occupational Therapy

## 2016-10-18 ENCOUNTER — Ambulatory Visit: Payer: BLUE CROSS/BLUE SHIELD | Admitting: *Deleted

## 2016-10-18 DIAGNOSIS — M6281 Muscle weakness (generalized): Secondary | ICD-10-CM

## 2016-10-18 DIAGNOSIS — R482 Apraxia: Secondary | ICD-10-CM

## 2016-10-18 DIAGNOSIS — R278 Other lack of coordination: Secondary | ICD-10-CM

## 2016-10-18 DIAGNOSIS — R208 Other disturbances of skin sensation: Secondary | ICD-10-CM

## 2016-10-18 DIAGNOSIS — I69820 Aphasia following other cerebrovascular disease: Secondary | ICD-10-CM

## 2016-10-18 DIAGNOSIS — I69351 Hemiplegia and hemiparesis following cerebral infarction affecting right dominant side: Secondary | ICD-10-CM | POA: Diagnosis not present

## 2016-10-18 NOTE — Therapy (Signed)
[facility] [address] [place], [place], [address] Phone: [phone]   Fax:  [phone]  Occupational Therapy Treatment  Patient Details  Name: Johnny Sullivan MRN: [id number] Date of Birth: 04/17/1956 Referring Provider: Dr. Roda ShuttersXu  Encounter Date: 10/18/2016      OT End of Session - 10/18/16 1112    Visit Number 5   Number of Visits 16   Date for OT Re-Evaluation 11/29/16   Authorization Type BCBS- no visit limitations   OT Start Time 0935   OT Stop Time 1018   OT Time Calculation (min) 43 min   Activity Tolerance Patient tolerated treatment well      Past Medical History:  Diagnosis Date  . Arthritis    Osteoarthritis- right hip  . Coronary artery disease   . GERD (gastroesophageal reflux disease)   . Hypertension   . Myocardial infarction (HCC)    " mild"-Dr. Abbey ChattersZakhary,cardiolgy[place] -gives clearance.  Marland Kitchen. Urethral stricture    'uses self [place] caths weekly" to correct.    Past Surgical History:  Procedure Laterality Date  . BACK SURGERY     lumbar discectomy  . CARDIAC CATHETERIZATION    . CORONARY ARTERY BYPASS GRAFT     09-20-2012 x3 -[place]  . IR PERCUTANEOUS ART THROMBECTOMY/INFUSION INTRACRANIAL INC DIAG ANGIO  09/26/2016  . LOOP RECORDER INSERTION N/A 09/30/2016   Procedure: Loop Recorder Insertion;  Surgeon: Hillis RangeAllred, James, MD;  [facility];  Service: Cardiovascular;  Laterality: N/A;  . RADIOLOGY WITH ANESTHESIA N/A 09/26/2016   Procedure: RADIOLOGY WITH ANESTHESIA;  Surgeon: Julieanne Cottoneveshwar, Sanjeev, MD;  [facility]: [facility] OR;  Service: Radiology;  Laterality: N/A;  . TEE WITHOUT CARDIOVERSION N/A 09/30/2016   Procedure: TRANSESOPHAGEAL ECHOCARDIOGRAM (TEE);  Surgeon: Laurey MoraleMcLean, Dalton S, MD;  [facility];  Service: Cardiovascular;  Laterality: N/A;  . TOTAL HIP ARTHROPLASTY Right 04/26/2016   Procedure: RIGHT TOTAL HIP ARTHROPLASTY ANTERIOR APPROACH;  Surgeon:  Durene RomansMatthew Olin, MD;  [facility]: [facility];  Service: Orthopedics;  Laterality: Right;  Marland Kitchen. VASECTOMY    . WRIST SURGERY     "implant of plastic bone"-mild ROM limits    There were no vitals filed for this visit.      Subjective Assessment - 10/18/16 0938    Subjective  Nodded head yes when asked about a task   Patient is accompained by: Family member  son   Pertinent History see epic . 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   Patient Stated Goals unable to state - pointed to right hand    Currently in Pain? No/denies                      OT Treatments/Exercises (OP) - 10/18/16 0001      Neurological Re-education Exercises   Other Exercises 1 Neuro re ed via functional tasks with emphasis on bilateral tasks to force use of RUE, cueing to use RUE as either dominant or nomdominant depending on difficulty of task, hand orientation, motor planning, and alternating tasks demands on RUE within the task.  Pt improves within the task with repetition and practice. Provided son with rationale and discussed several tasks pt and family could do at home with as long as family can cue.  Son asked appropriate questions and verbalized understanding                   OT Short Term Goals - 10/18/16 1109  OT SHORT TERM GOAL #1   Title Pt and wife will be mod I with HEP - 11/01/2016   Status On-going     OT SHORT TERM GOAL #2   Title Pt will demonstrate improved grip strength by at least 5 pounds to assist with functional activities.  (baseline= 10 pounds)   Status Achieved  35     OT SHORT TERM GOAL #3   Title Pt will demonstrate ability to pick up small light weight object with 2 or 3 point pinch with min a.   Status On-going     OT SHORT TERM GOAL #4   Title Pt will be mod I with bathing at shower level   Status Achieved     OT SHORT TERM GOAL #5   Title Pt will demonstrate ability to use RUE as gross assist for basic ADL tasks.    Status Achieved            OT Long Term Goals - 10/18/16 1109      OT LONG TERM GOAL #1   Title Pt and wife will be mod I with upgraded HEP - 11/29/2016   Status On-going     OT LONG TERM GOAL #2   Title Pt will demonstrate increased grip strength by 8 pounds to assist with functional tasks (baseline= 10 pounds)   Status On-going     OT LONG TERM GOAL #3   Title Pt will demonstrate ability to use RUE as non dominant during basic ADL tasks at least 50% of the time.    Status On-going     OT LONG TERM GOAL #4   Title Pt will be able to eat with R hand as dominant hand    Status On-going     OT LONG TERM GOAL #5   Title Pt will be mod I with dressing    Status On-going     OT LONG TERM GOAL #6   Title Pt will be mod I with grooming.    Status On-going               Plan - 10/18/16 1110    Clinical Impression Statement Pt is progressing toward goals. Pt has very suppotive family who are able to structure therapeutic activities at home.    Rehab Potential Good   Current Impairments/barriers affecting progress: apraxia, global aphasia   OT Frequency 2x / week   OT Duration 8 weeks   OT Treatment/Interventions Self-care/ADL training;Therapeutic exercise;Neuromuscular education;DME and/or AE instruction;Passive range of motion;Manual Therapy;Splinting;Therapeutic activities;Patient/family education   Plan NMR for hand function via functional tasks, constrain L hand, grip strenght, bilateral functional tasks.   Consulted and Agree with Plan of Care Patient;Family member/caregiver   Family Member Consulted son      Patient will benefit from skilled therapeutic intervention in order to improve the following deficits and impairments:  Decreased coordination, Decreased range of motion, Decreased strength, Impaired UE functional use, Impaired sensation  Visit Diagnosis: Apraxia  Hemiplegia and hemiparesis following cerebral infarction affecting right dominant side (HCC)  Muscle weakness  (generalized)  Other lack of coordination  Other disturbances of skin sensation    Problem List Patient Active Problem List   Diagnosis Date Noted  . Acute embolic stroke (HCC)   . Cerebrovascular accident (CVA) due to thrombosis of precerebral artery (HCC)   . Cerebrovascular accident (CVA) (HCC) 09/26/2016  . S/P right THA, AA 04/26/2016    Norton Pastel, OTR/L 10/18/2016, 11:13 AM  [place]  [facility] [address] [place], [place], [address] Phone: [phone]   Fax:  [phone]  Name: Ceaser Ebeling MRN: [id number] Date of Birth: Dec 29, 1955

## 2016-10-18 NOTE — Patient Instructions (Signed)
  Strategies for elevating soft palate: 1.  /ah/ loud and long - lifts the soft palate 2. Exaggerate any yawns to encourage maximum soft palate elevation  Functional and effective communication: 1. Continue working on accuracy of yes/no - try having written words, keep questions simple, being with questions you know the answer to. 2. Make a list of topics Phil needs/wants to discuss 3. Encourage Phil to write a key word, gesture 4. Start with what works 5. There should be 2 different "modes" at home - working on therapy stuff, and just communicating.

## 2016-10-18 NOTE — Therapy (Signed)
[facility] [address] [place], [place], [address] Phone: [phone]   Fax:  [phone]  Speech Language Pathology Treatment  Patient Details  Name: Johnny Sullivan MRN: [id number] Date of Birth: 12-17-1955 Referring Provider: Dr. Marvel Plan  Encounter Date: 10/18/2016      End of Session - 10/18/16 0945    Visit Number 7   Number of Visits 17   Date for SLP Re-Evaluation 12/02/16   SLP Start Time 0845   SLP Stop Time  0935   SLP Time Calculation (min) 50 min   Activity Tolerance Patient tolerated treatment well      Past Medical History:  Diagnosis Date  . Arthritis    Osteoarthritis- right hip  . Coronary artery disease   . GERD (gastroesophageal reflux disease)   . Hypertension   . Myocardial infarction (HCC)    " mild"-Dr. Abbey Chatters follows -gives clearance.  Marland Kitchen Urethral stricture    'uses self [place] caths weekly" to correct.    Past Surgical History:  Procedure Laterality Date  . BACK SURGERY     lumbar discectomy  . CARDIAC CATHETERIZATION    . CORONARY ARTERY BYPASS GRAFT     09-20-2012 x3 [place]  . IR PERCUTANEOUS ART THROMBECTOMY/INFUSION INTRACRANIAL INC DIAG ANGIO  09/26/2016  . LOOP RECORDER INSERTION N/A 09/30/2016   Procedure: Loop Recorder Insertion;  Surgeon: Hillis Range, MD;  [facility];  Service: Cardiovascular;  Laterality: N/A;  . RADIOLOGY WITH ANESTHESIA N/A 09/26/2016   Procedure: RADIOLOGY WITH ANESTHESIA;  Surgeon: Julieanne Cotton, MD;  Location: [facility] OR;  Service: Radiology;  Laterality: N/A;  . TEE WITHOUT CARDIOVERSION N/A 09/30/2016   Procedure: TRANSESOPHAGEAL ECHOCARDIOGRAM (TEE);  Surgeon: Laurey Morale, MD;  Location: [facility] ENDOSCOPY;  Service: Cardiovascular;  Laterality: N/A;  . TOTAL HIP ARTHROPLASTY Right 04/26/2016   Procedure: RIGHT TOTAL HIP ARTHROPLASTY ANTERIOR APPROACH;  Surgeon: Durene Romans, MD;  Location: [facility]  ORS;  Service: Orthopedics;  Laterality: Right;  Marland Kitchen VASECTOMY    . WRIST SURGERY     "implant of plastic bone"-mild ROM limits    There were no vitals filed for this visit.      Subjective Assessment - 10/18/16 0850    Subjective "[name]"   Patient is accompained by: Family member  son Alycia Rossetti   Currently in Pain? No/denies               ADULT SLP TREATMENT - 10/18/16 0001      General Information   Behavior/Cognition Alert;Cooperative;Pleasant mood     Treatment Provided   Treatment provided Cognitive-Linquistic     Pain Assessment   Pain Assessment No/denies pain     Cognitive-Linquistic Treatment   Treatment focused on Aphasia;Apraxia;Patient/family/caregiver education   Skilled Treatment ST session focused on education with pt's son Alycia Rossetti), including explanation of exercises to facilitate soft palate elevation and strategies for improving effective communication. Pt imitated 7/9 articulatory positions, increasing to 9/9 with max visual/verbal cues. Pt stimulable for /ma, mam/, but has difficulty producing /pa/ despite max verbal and visual cues. Pt/son were encouraged to continue working on exercises and drills at home, but keep therapy work separate from normal communication attempts, beginning with what pt is successful with (writing, gestures), to decrease frustration, increase success, and keep a distinction between "therapy activity" and "not therapy activity". Pt and son receptive to SLP education and instruction.      Assessment / Recommendations / Plan   Plan Continue with current plan  of care     Progression Toward Goals   Progression toward goals Progressing toward goals          SLP Education - 10/18/16 0945    Education provided Yes   Education Details soft palate exercises, improving communication effectiveness/decreasing frustration   Person(s) Educated Patient;Child(ren)   Methods Explanation;Demonstration;Verbal cues;Handout   Comprehension  Verbalized understanding;Returned demonstration;Verbal cues required;Need further instruction          SLP Short Term Goals - 10/18/16 0948      SLP SHORT TERM GOAL #1   Title Pt will answer simple yes/no questions accurately, either by head nod, pointing to the correct word, or verbalizing "yes" or "no"   Time 2   Period Weeks   Status On-going     SLP SHORT TERM GOAL #2   Title Pt will follow 1-step verbal directions consistently, given mod (verbal/visual/tactile) cues.   Time 2   Period Weeks   Status On-going     SLP SHORT TERM GOAL #3   Title Pt will imitate 3 sounds accurately given max visual and verbal cues   Time 2   Period Weeks   Status On-going     SLP SHORT TERM GOAL #4   Title Pt will participate in further assessment of reading comprehension and written expression   Status Achieved     SLP SHORT TERM GOAL #5   Title Pt will demonstrate comprehension of short phrases with 80% accuracy, given mod cues   Time 2   Period Weeks   Status On-going     SLP SHORT TERM GOAL #6   Title Pt will write single words with 95% accuracy, given min cues.   Time 2   Period Weeks   Status On-going     SLP SHORT TERM GOAL #7   Title Pt will demonstrate use of AAC/ multimodal communication to respond to simple questions with min-mod A.   Time 2   Period Weeks   Status On-going          SLP Long Term Goals - 10/18/16 0949      SLP LONG TERM GOAL #1   Title Pt will answer complex yes/no questions accurately, either by head nod, pointing to the correct word, or verbalizing "yes" or "no"   Time 6   Period Weeks   Status On-going     SLP LONG TERM GOAL #2   Title Pt will follow 2-step verbal directions consistently, given min (verbal only) cues.   Time 6   Period Weeks   Status On-going     SLP LONG TERM GOAL #3   Title Pt will imitate 5 sounds accurately given max visual and verbal cues   Time 6   Period Weeks   Status On-going     SLP LONG TERM GOAL #4    Title Pt will demonstrate comprehension of sentence length material with 80% accuracy   Time 6   Period Weeks   Status On-going     SLP LONG TERM GOAL #5   Title Pt will write short phrases with 80% accuracy   Time 6   Period Weeks   Status On-going     SLP LONG TERM GOAL #6   Title Pt/family will report 25% improvement in expressive communication with use of AAC/multimodal means of communication.   Time 6   Period Weeks   Status On-going          Plan - 10/18/16 0946    Clinical  Impression Statement Pt works diligently in therapy. Son Alycia Rossetti present today, and asked several good questions about working with his dad at home. Pt is making slow but gradual progress in therapy. Continued skilled ST intervention is recommended to facilitate functional and effective communication and decrease caregiver burden.   Speech Therapy Frequency 3x / week   Duration --  8 weeks   Treatment/Interventions Cueing hierarchy;SLP instruction and feedback;Language facilitation;Environmental controls;Functional tasks;Compensatory strategies;Multimodal communcation approach;Patient/family education;Compensatory techniques   Potential to Achieve Goals Good   Potential Considerations Ability to learn/carryover information;Family/community support;Previous level of function;Cooperation/participation level;Severity of impairments   SLP Home Exercise Plan reviewed   Consulted and Agree with Plan of Care Patient;Family member/caregiver   Family Member Consulted son Alycia Rossetti      Patient will benefit from skilled therapeutic intervention in order to improve the following deficits and impairments:   Apraxia  Aphasia following other cerebrovascular disease    Problem List Patient Active Problem List   Diagnosis Date Noted  . Acute embolic stroke (HCC)   . Cerebrovascular accident (CVA) due to thrombosis of precerebral artery (HCC)   . Cerebrovascular accident (CVA) (HCC) 09/26/2016  . S/P right THA, AA  04/26/2016   Celia B. [place], MSP, CCC-SLP  Leigh Aurora 10/18/2016, 9:50 AM  [facility] [address] [place], [place], [address] Phone: [phone]   Fax:  [phone]   Name: Harish Bram MRN: [id number] Date of Birth: 1955-11-10

## 2016-10-19 ENCOUNTER — Ambulatory Visit: Payer: BLUE CROSS/BLUE SHIELD | Admitting: Speech Pathology

## 2016-10-19 DIAGNOSIS — I69351 Hemiplegia and hemiparesis following cerebral infarction affecting right dominant side: Secondary | ICD-10-CM | POA: Diagnosis not present

## 2016-10-19 DIAGNOSIS — I69820 Aphasia following other cerebrovascular disease: Secondary | ICD-10-CM

## 2016-10-19 DIAGNOSIS — R482 Apraxia: Secondary | ICD-10-CM

## 2016-10-19 NOTE — Therapy (Signed)
[facility] [address] [place], [place], [address] Phone: [phone]   Fax:  [phone]  Speech Language Pathology Treatment  Patient Details  Name: Johnny Sullivan MRN: [id number] Date of Birth: 1955-11-02 Referring Provider: Dr. Marvel Plan  Encounter Date: 10/19/2016      End of Session - 10/19/16 1454    Visit Number 8   Number of Visits 17   Date for SLP Re-Evaluation 12/02/16   SLP Start Time 1233   SLP Stop Time  1315   SLP Time Calculation (min) 42 min   Activity Tolerance Patient tolerated treatment well      Past Medical History:  Diagnosis Date  . Arthritis    Osteoarthritis- right hip  . Coronary artery disease   . GERD (gastroesophageal reflux disease)   . Hypertension   . Myocardial infarction (HCC)    " mild"-Dr. Abbey Chatters follows -gives clearance.  Marland Kitchen Urethral stricture    'uses self [place] caths weekly" to correct.    Past Surgical History:  Procedure Laterality Date  . BACK SURGERY     lumbar discectomy  . CARDIAC CATHETERIZATION    . CORONARY ARTERY BYPASS GRAFT     09-20-2012 x3 -[place]  . IR PERCUTANEOUS ART THROMBECTOMY/INFUSION INTRACRANIAL INC DIAG ANGIO  09/26/2016  . LOOP RECORDER INSERTION N/A 09/30/2016   Procedure: Loop Recorder Insertion;  Surgeon: Hillis Range, MD;  [facility];  Service: Cardiovascular;  Laterality: N/A;  . RADIOLOGY WITH ANESTHESIA N/A 09/26/2016   Procedure: RADIOLOGY WITH ANESTHESIA;  Surgeon: Julieanne Cotton, MD;  [facility]: [facility] OR;  Service: Radiology;  Laterality: N/A;  . TEE WITHOUT CARDIOVERSION N/A 09/30/2016   Procedure: TRANSESOPHAGEAL ECHOCARDIOGRAM (TEE);  Surgeon: Laurey Morale, MD;  Location: [facility] ENDOSCOPY;  Service: Cardiovascular;  Laterality: N/A;  . TOTAL HIP ARTHROPLASTY Right 04/26/2016   Procedure: RIGHT TOTAL HIP ARTHROPLASTY ANTERIOR APPROACH;  Surgeon: Durene Romans, MD;  [facility]: [facility]  ORS;  Service: Orthopedics;  Laterality: Right;  Marland Kitchen VASECTOMY    . WRIST SURGERY     "implant of plastic bone"-mild ROM limits    There were no vitals filed for this visit.      Subjective Assessment - 10/19/16 1237    Subjective mamamma   Patient is accompained by: Family member   Special Tests daughter, Baxter Hire   Currently in Pain? No/denies               ADULT SLP TREATMENT - 10/19/16 1237      General Information   Behavior/Cognition Alert;Cooperative;Pleasant mood     Treatment Provided   Treatment provided Cognitive-Linquistic     Pain Assessment   Pain Assessment No/denies pain     Cognitive-Linquistic Treatment   Treatment focused on Aphasia;Apraxia;Patient/family/caregiver education   Skilled Treatment Pt imitated /p/, /t/, /k/, /f/, /s/ in isolation with 65% accuracy, however when voiced consonants attempted or cv utterace attempted, pt reverted to nasal consonants. He located picutre of 3 family members to command on his ipad. Encouraged family to continue making a Counsellor for pt with hobbies, favorite restaurants, friends,etc to assist with communication. Written expression 50% accurate in response to questions. Pt tearful during session.     Assessment / Recommendations / Plan   Plan Continue with current plan of care     Progression Toward Goals   Progression toward goals Progressing toward goals          SLP Education - 10/18/16 0945    Education provided  Yes   Education Details soft palate exercises, improving communication effectiveness/decreasing frustration   Person(s) Educated Patient;Child(ren)   Methods Explanation;Demonstration;Verbal cues;Handout   Comprehension Verbalized understanding;Returned demonstration;Verbal cues required;Need further instruction          SLP Short Term Goals - 10/19/16 1453      SLP SHORT TERM GOAL #1   Title Pt will answer simple yes/no questions accurately, either by head nod, pointing to the  correct word, or verbalizing "yes" or "no"   Time 2   Period Weeks   Status On-going     SLP SHORT TERM GOAL #2   Title Pt will follow 1-step verbal directions consistently, given mod (verbal/visual/tactile) cues.   Time 2   Period Weeks   Status On-going     SLP SHORT TERM GOAL #3   Title Pt will imitate 3 sounds accurately given max visual and verbal cues   Time 2   Period Weeks   Status On-going     SLP SHORT TERM GOAL #4   Title Pt will participate in further assessment of reading comprehension and written expression   Status Achieved     SLP SHORT TERM GOAL #5   Title Pt will demonstrate comprehension of short phrases with 80% accuracy, given mod cues   Time 2   Period Weeks   Status On-going     SLP SHORT TERM GOAL #6   Title Pt will write single words with 95% accuracy, given min cues.   Time 2   Period Weeks   Status On-going     SLP SHORT TERM GOAL #7   Title Pt will demonstrate use of AAC/ multimodal communication to respond to simple questions with min-mod A.   Time 2   Period Weeks   Status On-going          SLP Long Term Goals - 10/19/16 1454      SLP LONG TERM GOAL #1   Title Pt will answer complex yes/no questions accurately, either by head nod, pointing to the correct word, or verbalizing "yes" or "no"   Time 6   Period Weeks   Status On-going     SLP LONG TERM GOAL #2   Title Pt will follow 2-step verbal directions consistently, given min (verbal only) cues.   Time 6   Period Weeks   Status On-going     SLP LONG TERM GOAL #3   Title Pt will imitate 5 sounds accurately given max visual and verbal cues   Time 6   Period Weeks   Status On-going     SLP LONG TERM GOAL #4   Title Pt will demonstrate comprehension of sentence length material with 80% accuracy   Time 6   Period Weeks   Status On-going     SLP LONG TERM GOAL #5   Title Pt will write short phrases with 80% accuracy   Time 6   Period Weeks   Status On-going     SLP  LONG TERM GOAL #6   Title Pt/family will report 25% improvement in expressive communication with use of AAC/multimodal means of communication.   Time 6   Period Weeks   Status On-going          Plan - 10/19/16 1453    Clinical Impression Statement Pt works diligently in therapy. Daughter, [name] present today, and asked several good questions about working with his dad at home. Pt is making slow but gradual progress in therapy. Continued skilled ST intervention  is recommended to facilitate functional and effective communication and decrease caregiver burden.   Speech Therapy Frequency 3x / week   Treatment/Interventions Cueing hierarchy;SLP instruction and feedback;Language facilitation;Environmental controls;Functional tasks;Compensatory strategies;Multimodal communcation approach;Patient/family education;Compensatory techniques   Potential Considerations Ability to learn/carryover information;Severity of impairments   Consulted and Agree with Plan of Care Patient   Family Member Consulted daughter Baxter Hire      Patient will benefit from skilled therapeutic intervention in order to improve the following deficits and impairments:   Apraxia  Aphasia following other cerebrovascular disease    Problem List Patient Active Problem List   Diagnosis Date Noted  . Acute embolic stroke (HCC)   . Cerebrovascular accident (CVA) due to thrombosis of precerebral artery (HCC)   . Cerebrovascular accident (CVA) (HCC) 09/26/2016  . S/P right THA, AA 04/26/2016    Lovvorn, Radene Journey MS, CCC-SLP 10/19/2016, 2:54 PM   [facility] [address] [place], [place], [address] Phone: [phone]   Fax:  [phone]   Name: Yandriel Boening MRN: [id number] Date of Birth: 1955/07/03

## 2016-10-20 NOTE — Telephone Encounter (Signed)
FMlA form sent to medical records for payment of 50.00 dollars. Pt has appt in July 2018 for hospital stroke follow up.

## 2016-10-24 ENCOUNTER — Ambulatory Visit: Payer: BLUE CROSS/BLUE SHIELD | Admitting: Speech Pathology

## 2016-10-24 ENCOUNTER — Encounter: Payer: Self-pay | Admitting: Occupational Therapy

## 2016-10-24 ENCOUNTER — Ambulatory Visit: Payer: BLUE CROSS/BLUE SHIELD | Admitting: Occupational Therapy

## 2016-10-24 DIAGNOSIS — I69351 Hemiplegia and hemiparesis following cerebral infarction affecting right dominant side: Secondary | ICD-10-CM

## 2016-10-24 DIAGNOSIS — R482 Apraxia: Secondary | ICD-10-CM

## 2016-10-24 DIAGNOSIS — M6281 Muscle weakness (generalized): Secondary | ICD-10-CM

## 2016-10-24 DIAGNOSIS — I69820 Aphasia following other cerebrovascular disease: Secondary | ICD-10-CM

## 2016-10-24 DIAGNOSIS — R208 Other disturbances of skin sensation: Secondary | ICD-10-CM

## 2016-10-24 DIAGNOSIS — R278 Other lack of coordination: Secondary | ICD-10-CM

## 2016-10-24 NOTE — Patient Instructions (Signed)
  Coordination Activities  Perform the following activities for 0-15 minutes 2 times per day with right hand(s).   Flip cards 1 at a time as fast as you can.  Pick up coins and place in container or coin bank.  Practice writing and/or typing.  Screw together nuts and bolts, then unfasten.

## 2016-10-24 NOTE — Therapy (Signed)
[facility] [address] [place], [place], [address] Phone: [phone]   Fax:  [phone]  Occupational Therapy Treatment  Patient Details  Name: Johnny Sullivan MRN: [id number] Date of Birth: [birth year] Referring Provider: Dr. Erlinda Hong  Encounter Date: 10/24/2016      OT End of Session - 10/24/16 1308    Visit Number 6   Number of Visits 16   Date for OT Re-Evaluation 11/29/16   Authorization Type [employer]- no visit limitations   OT Start Time 0932   OT Stop Time 1015   OT Time Calculation (min) 43 min   Activity Tolerance Patient tolerated treatment well      Past Medical History:  Diagnosis Date  . Arthritis    Osteoarthritis- right hip  . Coronary artery disease   . GERD (gastroesophageal reflux disease)   . Hypertension   . Myocardial infarction ([place])    " mild"-Dr. Debbora Lacrosse follows -gives clearance.  Marland Kitchen Urethral stricture    'uses self [place] caths weekly" to correct.    Past Surgical History:  Procedure Laterality Date  . BACK SURGERY     lumbar discectomy  . CARDIAC CATHETERIZATION    . CORONARY ARTERY BYPASS GRAFT     09-20-2012 x3 -[place]  . IR PERCUTANEOUS ART THROMBECTOMY/INFUSION INTRACRANIAL INC DIAG ANGIO  09/26/2016  . LOOP RECORDER INSERTION N/A 09/30/2016   Procedure: Loop Recorder Insertion;  Surgeon: Thompson Grayer, MD;  Location: [facility] CV LAB;  Service: Cardiovascular;  Laterality: N/A;  . RADIOLOGY WITH ANESTHESIA N/A 09/26/2016   Procedure: RADIOLOGY WITH ANESTHESIA;  Surgeon: Luanne Bras, MD;  Location: [facility];  Service: Radiology;  Laterality: N/A;  . TEE WITHOUT CARDIOVERSION N/A 09/30/2016   Procedure: TRANSESOPHAGEAL ECHOCARDIOGRAM (TEE);  Surgeon: Larey Dresser, MD;  Location: [facility] ENDOSCOPY;  Service: Cardiovascular;  Laterality: N/A;  . TOTAL HIP ARTHROPLASTY Right 04/26/2016   Procedure: RIGHT TOTAL HIP ARTHROPLASTY ANTERIOR APPROACH;  Surgeon:  Paralee Cancel, MD;  [facility] ORS;  Service: Orthopedics;  Laterality: Right;  Marland Kitchen VASECTOMY    . WRIST SURGERY     "implant of plastic bone"-mild ROM limits    There were no vitals filed for this visit.      Subjective Assessment - 10/24/16 0934    Subjective  OK when told he had speech therapy next   Patient is accompained by: Family member  sister   Pertinent History see epic . 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   Patient Stated Goals unable to state - pointed to right hand    Currently in Pain? No/denies                      OT Treatments/Exercises (OP) - 10/24/16 0001      ADLs   Eating Addressed using all utensils during a meal without red foam.  Pt able to use spoon and knife with no AE and fork with small wrap of coban. Pt needs cueing and increased time to hold utensils correctly and to manipulate and orient utensils in R hand.  Encouraged pt and family for pt to pick and orient utensil in R hand  vs placing utensil in right hand with left hand.     ADL Comments Checked STG's - pt has met all STG's and is now progressing toward LTG's.     Neurological Re-education Exercises   Other Exercises 1 Issued several activities for pt to complete at home to address  increased functional use of the R hand, hand orientation and bilateral activities (see pt instruction section).  Pt able to return demonstrate after repetitive instruction and sister also able to verbalize understanding and cue pt as needed.                 OT Education - 10/24/16 1306    Education provided Yes   Education Details fine motor coordination, self feeding.   Person(s) Educated Patient;Other (comment)  sister   Methods Explanation;Demonstration;Tactile cues;Verbal cues;Handout   Comprehension Verbalized understanding;Returned demonstration  sister able to cue and assist prn          OT Short Term Goals - 10/24/16 1301      OT SHORT TERM GOAL #1   Title Pt  and wife will be mod I with HEP - 11/01/2016   Status Achieved     OT SHORT TERM GOAL #2   Title Pt will demonstrate improved grip strength by at least 5 pounds to assist with functional activities.  (baseline= 10 pounds)   Status Achieved  35     OT SHORT TERM GOAL #3   Title Pt will demonstrate ability to pick up small light weight object with 2 or 3 point pinch with min a.   Status Achieved     OT SHORT TERM GOAL #4   Title Pt will be mod I with bathing at shower level   Status Achieved     OT SHORT TERM GOAL #5   Title Pt will demonstrate ability to use RUE as gross assist for basic ADL tasks.    Status Achieved           OT Long Term Goals - 10/24/16 1301      OT LONG TERM GOAL #1   Title Pt and wife will be mod I with upgraded HEP - 11/29/2016   Status On-going     OT LONG TERM GOAL #2   Title Pt will demonstrate increased grip strength by 8 pounds to assist with functional tasks (baseline= 10 pounds)   Status On-going     OT LONG TERM GOAL #3   Title Pt will demonstrate ability to use RUE as non dominant during basic ADL tasks at least 50% of the time.    Status On-going     OT LONG TERM GOAL #4   Title Pt will be able to eat with R hand as dominant hand    Status On-going     OT LONG TERM GOAL #5   Title Pt will be mod I with dressing    Status On-going     OT LONG TERM GOAL #6   Title Pt will be mod I with grooming.    Status On-going               Plan - 10/24/16 1306    Clinical Impression Statement Pt has met all STG's and is now working toward LTG's. Pt continues to demonstrate improved functional use of the RUE.   Rehab Potential Good   Current Impairments/barriers affecting progress: apraxia, global aphasia   OT Frequency 2x / week   OT Duration 8 weeks   OT Treatment/Interventions Self-care/ADL training;Therapeutic exercise;Neuromuscular education;DME and/or AE instruction;Passive range of motion;Manual Therapy;Splinting;Therapeutic  activities;Patient/family education   Plan NMR for hand function via functional tasks, constrain L hand, bilatearl functional tasks   Consulted and Agree with Plan of Care Patient;Family member/caregiver   Family Member Consulted sister      Patient will  benefit from skilled therapeutic intervention in order to improve the following deficits and impairments:  Decreased coordination, Decreased range of motion, Decreased strength, Impaired UE functional use, Impaired sensation  Visit Diagnosis: Apraxia  Hemiplegia and hemiparesis following cerebral infarction affecting right dominant side (HCC)  Muscle weakness (generalized)  Other lack of coordination  Other disturbances of skin sensation    Problem List Patient Active Problem List   Diagnosis Date Noted  . Acute embolic stroke ([place])   . Cerebrovascular accident (CVA) due to thrombosis of precerebral artery ([place])   . Cerebrovascular accident (CVA) ([place]) 09/26/2016  . S/P right THA, AA 04/26/2016    Quay Burow , OTR/L 10/24/2016, 1:09 PM  [facility] [address] [place], [place], [address] Phone: [phone]   Fax:  [phone]  Name: Johnny Sullivan MRN: [id number] Date of Birth: [birth year]

## 2016-10-24 NOTE — Therapy (Signed)
[facility] [address] [place], [place], [address] Phone: [phone]   Fax:  [phone]  Speech Language Pathology Treatment  Patient Details  Name: Johnny Sullivan MRN: [id number] Date of Birth: Jul 15, 1955 Referring Provider: Dr. Marvel Plan  Encounter Date: 10/24/2016      End of Session - 10/24/16 1220    Visit Number 9   Number of Visits 17   Date for SLP Re-Evaluation 12/02/16   SLP Start Time 0932   SLP Stop Time  1013   SLP Time Calculation (min) 41 min      Past Medical History:  Diagnosis Date  . Arthritis    Osteoarthritis- right hip  . Coronary artery disease   . GERD (gastroesophageal reflux disease)   . Hypertension   . Myocardial infarction (HCC)    " mild"-Dr. Abbey Chatters follows -gives clearance.  Marland Kitchen Urethral stricture    'uses self [place] caths weekly" to correct.    Past Surgical History:  Procedure Laterality Date  . BACK SURGERY     lumbar discectomy  . CARDIAC CATHETERIZATION    . CORONARY ARTERY BYPASS GRAFT     09-20-2012 x3 -[place]  . IR PERCUTANEOUS ART THROMBECTOMY/INFUSION INTRACRANIAL INC DIAG ANGIO  09/26/2016  . LOOP RECORDER INSERTION N/A 09/30/2016   Procedure: Loop Recorder Insertion;  Surgeon: Hillis Range, MD;  [facility];  Service: Cardiovascular;  Laterality: N/A;  . RADIOLOGY WITH ANESTHESIA N/A 09/26/2016   Procedure: RADIOLOGY WITH ANESTHESIA;  Surgeon: Julieanne Cotton, MD;  [facility] OR;  Service: Radiology;  Laterality: N/A;  . TEE WITHOUT CARDIOVERSION N/A 09/30/2016   Procedure: TRANSESOPHAGEAL ECHOCARDIOGRAM (TEE);  Surgeon: Laurey Morale, MD;  Location: [facility] ENDOSCOPY;  Service: Cardiovascular;  Laterality: N/A;  . TOTAL HIP ARTHROPLASTY Right 04/26/2016   Procedure: RIGHT TOTAL HIP ARTHROPLASTY ANTERIOR APPROACH;  Surgeon: Durene Romans, MD;  [facility] ORS;  Service: Orthopedics;  Laterality: Right;  Marland Kitchen  VASECTOMY    . WRIST SURGERY     "implant of plastic bone"-mild ROM limits    There were no vitals filed for this visit.      Subjective Assessment - 10/24/16 1020    Subjective [place]   Patient is accompained by: Family member   Special Tests sister [name]   Currently in Pain? No/denies               ADULT SLP TREATMENT - 10/24/16 1021      General Information   Behavior/Cognition Alert;Cooperative;Pleasant mood     Treatment Provided   Treatment provided Cognitive-Linquistic     Pain Assessment   Pain Assessment No/denies pain     Cognitive-Linquistic Treatment   Treatment focused on Aphasia;Apraxia;Patient/family/caregiver education   Skilled Treatment Pt followed simple 1 step commands with consistent repetition less than 50%, slight improvement with gesture cues. He answered simple yes/no questions (no context) with usual repetition and occasional min gestural cues with 70% accuracy. Pt's sister [name] with him today. Pt consistently producing voiceless consonants /p, t, k, s, f/ in unison, fading to placement cues. Imitating vowels /a, u, aI, o/ with consistent unison and placement cues. Did not produce /i/. When producing cv syllables with voicing on vowel, voiceless consonants became voiced consonants. CV syllables of voiceless consonants completed with whisper (no phonation) CVC voiceless syllables produced with unison fading to placement cues - Pt did not produce /k/ or /g/ today, however with mod to max tactile and visual cues, he approximated lingual movement for  velar ststops.  Recorded pt producing sounds on his phone for home practice. Reading comprehension at simple phrase level with consistent mod A - homework provided reading comprehension at 3-4 words.      Assessment / Recommendations / Plan   Plan Continue with current plan of care     Progression Toward Goals   Progression toward goals Progressing toward goals            SLP Short Term Goals -  10/24/16 1218      SLP SHORT TERM GOAL #1   Title Pt will answer simple yes/no questions accurately, either by head nod, pointing to the correct word, or verbalizing "yes" or "no"   Time 1   Period Weeks   Status On-going     SLP SHORT TERM GOAL #2   Title Pt will follow 1-step verbal directions consistently, given mod (verbal/visual/tactile) cues.   Time 1   Period Weeks   Status On-going     SLP SHORT TERM GOAL #3   Title Pt will imitate 3 sounds accurately given max visual and verbal cues   Time 1   Period Weeks   Status Achieved     SLP SHORT TERM GOAL #4   Title Pt will participate in further assessment of reading comprehension and written expression   Status Achieved     SLP SHORT TERM GOAL #5   Title Pt will demonstrate comprehension of short phrases with 80% accuracy, given mod cues   Time 1   Period Weeks   Status On-going     SLP SHORT TERM GOAL #6   Title Pt will write single words with 95% accuracy, given min cues.   Time 1   Period Weeks   Status On-going     SLP SHORT TERM GOAL #7   Title Pt will demonstrate use of AAC/ multimodal communication to respond to simple questions with min-mod A.   Time 1   Period Weeks   Status On-going          SLP Long Term Goals - 10/24/16 1220      SLP LONG TERM GOAL #1   Title Pt will answer complex yes/no questions accurately, either by head nod, pointing to the correct word, or verbalizing "yes" or "no"   Time 5   Period Weeks   Status On-going     SLP LONG TERM GOAL #2   Title Pt will follow 2-step verbal directions consistently, given min (verbal only) cues.   Time 5   Period Weeks   Status On-going     SLP LONG TERM GOAL #3   Title Pt will imitate 5 sounds accurately given max visual and verbal cues   Time 5   Period Weeks   Status On-going     SLP LONG TERM GOAL #4   Title Pt will demonstrate comprehension of sentence length material with 80% accuracy   Time 5   Period Weeks   Status On-going      SLP LONG TERM GOAL #5   Title Pt will write short phrases with 80% accuracy   Time 5   Period Weeks   Status On-going     SLP LONG TERM GOAL #6   Title Pt/family will report 25% improvement in expressive communication with use of AAC/multimodal means of communication.   Time 5   Period Weeks   Status On-going          Plan - 10/24/16 1214    Clinical Impression  Statement Pt continues to work hard in therapy. Sister, Bjorn LoserRhonda present today.  He continues to make gradual gains, with excellent family assistance and carryover of home practice. Social responses and spontaneous utterances continue to be perseverative nasal consonants. Auditory and reading comprehension at sentence leve with max A. Continue skilled ST to maximize functional communication for QOL and to reduce caregiver burden.    Treatment/Interventions Cueing hierarchy;SLP instruction and feedback;Language facilitation;Environmental controls;Functional tasks;Compensatory strategies;Multimodal communcation approach;Patient/family education;Compensatory techniques   Potential to Achieve Goals Good   Potential Considerations Ability to learn/carryover information;Severity of impairments      Patient will benefit from skilled therapeutic intervention in order to improve the following deficits and impairments:   Apraxia  Aphasia following other cerebrovascular disease    Problem List Patient Active Problem List   Diagnosis Date Noted  . Acute embolic stroke (HCC)   . Cerebrovascular accident (CVA) due to thrombosis of precerebral artery (HCC)   . Cerebrovascular accident (CVA) (HCC) 09/26/2016  . S/P right THA, AA 04/26/2016    Xian Apostol, Radene JourneyLaura Ann MS, CCC-SLP 10/24/2016, 12:21 PM  [facility] [address] [place], [place], [address] Phone: [phone]   Fax:  [phone]   Name: Johnny Sullivan MRN: [id number] Date of Birth: 02/22/1956

## 2016-10-25 ENCOUNTER — Ambulatory Visit: Payer: BLUE CROSS/BLUE SHIELD | Admitting: Occupational Therapy

## 2016-10-25 ENCOUNTER — Encounter: Payer: Self-pay | Admitting: Occupational Therapy

## 2016-10-25 ENCOUNTER — Ambulatory Visit: Payer: BLUE CROSS/BLUE SHIELD | Admitting: Speech Pathology

## 2016-10-25 DIAGNOSIS — R278 Other lack of coordination: Secondary | ICD-10-CM

## 2016-10-25 DIAGNOSIS — R482 Apraxia: Secondary | ICD-10-CM

## 2016-10-25 DIAGNOSIS — I69351 Hemiplegia and hemiparesis following cerebral infarction affecting right dominant side: Secondary | ICD-10-CM | POA: Diagnosis not present

## 2016-10-25 DIAGNOSIS — I69820 Aphasia following other cerebrovascular disease: Secondary | ICD-10-CM

## 2016-10-25 DIAGNOSIS — R208 Other disturbances of skin sensation: Secondary | ICD-10-CM

## 2016-10-25 DIAGNOSIS — M6281 Muscle weakness (generalized): Secondary | ICD-10-CM

## 2016-10-25 NOTE — Therapy (Signed)
[facility] [address] [place], [place], [address] Phone: [phone]   Fax:  [phone]  Occupational Therapy Treatment  Patient Details  Name: Johnny Sullivan MRN: [id number] Date of Birth: 09/08/1955 Referring Provider: Dr. Roda ShuttersXu  Encounter Date: 10/25/2016      OT End of Session - 10/25/16 1429    Visit Number 7   Number of Visits 16   Date for OT Re-Evaluation 11/29/16   Authorization Type [employer]- no visit limitations   OT Start Time 1316   OT Stop Time 1401   OT Time Calculation (min) 45 min   Activity Tolerance Patient tolerated treatment well      Past Medical History:  Diagnosis Date  . Arthritis    Osteoarthritis- right hip  . Coronary artery disease   . GERD (gastroesophageal reflux disease)   . Hypertension   . Myocardial infarction (HCC)    " mild"-Dr. Abbey ChattersZakhary,cardiolgy-[place] follows -gives clearance.  Marland Kitchen. Urethral stricture    'uses self [place] cath[place] weekly" to correct.    Past Surgical History:  Procedure Laterality Date  . BACK SURGERY     lumbar discectomy  . CARDIAC CATHETERIZATION    . CORONARY ARTERY BYPASS GRAFT     09-20-2012 x3 -[place]  . IR PERCUTANEOUS ART THROMBECTOMY/INFUSION INTRACRANIAL INC DIAG ANGIO  09/26/2016  . LOOP RECORDER INSERTION N/A 09/30/2016   Procedure: Loop Recorder Insertion;  Surgeon: Hillis RangeAllred, James, MD;  [facility];  Service: Cardiovascular;  Laterality: N/A;  . RADIOLOGY WITH ANESTHESIA N/A 09/26/2016   Procedure: RADIOLOGY WITH ANESTHESIA;  Surgeon: Julieanne Cottoneveshwar, Sanjeev, MD;  Location: [facility] OR;  Service: Radiology;  Laterality: N/A;  . TEE WITHOUT CARDIOVERSION N/A 09/30/2016   Procedure: TRANSESOPHAGEAL ECHOCARDIOGRAM (TEE);  Surgeon: Laurey MoraleMcLean, Dalton S, MD;  [facility];  Service: Cardiovascular;  Laterality: N/A;  . TOTAL HIP ARTHROPLASTY Right 04/26/2016   Procedure: RIGHT TOTAL HIP ARTHROPLASTY ANTERIOR APPROACH;  Surgeon:  Durene RomansMatthew Olin, MD;  Location: [facility] ORS;  Service: Orthopedics;  Laterality: Right;  Marland Kitchen. VASECTOMY    . WRIST SURGERY     "implant of plastic bone"-mild ROM limits    There were no vitals filed for this visit.      Subjective Assessment - 10/25/16 1320    Subjective  Yep when asked how he was   Patient is accompained by: Family member  sister   Pertinent History see epic . 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   Patient Stated Goals unable to state - pointed to right hand    Currently in Pain? Yes   Pain Score --  pt unable to rate   Pain Location Hand   Pain Orientation Right   Pain Descriptors / Indicators --  pt unable to state.  Pt has h/o of arthritis and was taking daily NSAID. This was stopped due to stroke. Pt indicates yes when asked if this is his arthritis pain.    Pain Type Chronic pain   Pain Onset Other (comment)   Pain Frequency Intermittent   Aggravating Factors  h/o of arthritis and no longer able to take daily NSAID. Sister to contact MD to see there is something else pt can take or use.    Pain Relieving Factors heat folllowed by stretching exercises followed by ice appeared to help- pt indicated that pain was less.  OT Treatments/Exercises (OP) - 10/25/16 0001      Exercises   Exercises Hand     Hand Exercises   Other Hand Exercises Pt arrived today and sister stated that pt had been indicating that his arthritis in his right hand has flaired up despite the fact that therapy has avoided any aggressie strengthening.  Pt was taking daily NSAID prior to stroke for years and no longer is able to take this. Instructed pt to decrease theraputty to yellow and every other day temporarily (suspect this is not causing issue as pt has signifcant grip strength in R hand and is currently only using red putty - purposesly avoided aggressive strenthening due to history of arthritis).  Pt issued gentle ROM and stretching  exercises designed for arthritis tightness and pain.  Pt able to return demonstrate after instruction and sister states she can help prn. Handout provided.      Modalities   Modalities Cryotherapy;Paraffin     Cryotherapy   Number Minutes Cryotherapy 5 Minutes   Cryotherapy Location Hand   Type of Cryotherapy Ice massage  pt/sister instructed in how and when to do at home     RUE Paraffin   Number Minutes Paraffin 8 Minutes   RUE Paraffin Location Hand   Comments to reduce pain and stiffness prior to manual therapy and exercises.      Manual Therapy   Manual Therapy Joint mobilization;Soft tissue mobilization   Manual therapy comments joint and soft tissue mob to R hand prior to exercises to reduce tightness and improve pain free ROM.  At end of session pt could indicate that hand was not as tight and not as painful.  Sister to follow up with MD.                OT Education - 10/25/16 1418    Education provided Yes   Education Details gentle ROM and stretching exercises, use of heat and ice to address athritis pain in R hand.   Person(s) Educated Patient   Methods Explanation;Demonstration;Handout;Verbal cues   Comprehension Verbalized understanding;Returned demonstration  sister to help PRN          OT Short Term Goals - 10/25/16 1427      OT SHORT TERM GOAL #1   Title Pt and wife will be mod I with HEP - 11/01/2016   Status Achieved     OT SHORT TERM GOAL #2   Title Pt will demonstrate improved grip strength by at least 5 pounds to assist with functional activities.  (baseline= 10 pounds)   Status Achieved  35     OT SHORT TERM GOAL #3   Title Pt will demonstrate ability to pick up small light weight object with 2 or 3 point pinch with min a.   Status Achieved     OT SHORT TERM GOAL #4   Title Pt will be mod I with bathing at shower level   Status Achieved     OT SHORT TERM GOAL #5   Title Pt will demonstrate ability to use RUE as gross assist for basic  ADL tasks.    Status Achieved           OT Long Term Goals - 10/25/16 1427      OT LONG TERM GOAL #1   Title Pt and wife will be mod I with upgraded HEP - 11/29/2016   Status On-going     OT LONG TERM GOAL #2   Title Pt will demonstrate  increased grip strength by 8 pounds to assist with functional tasks (baseline= 10 pounds)   Status On-going     OT LONG TERM GOAL #3   Title Pt will demonstrate ability to use RUE as non dominant during basic ADL tasks at least 50% of the time.    Status On-going     OT LONG TERM GOAL #4   Title Pt will be able to eat with R hand as dominant hand    Status On-going     OT LONG TERM GOAL #5   Title Pt will be mod I with dressing    Status On-going     OT LONG TERM GOAL #6   Title Pt will be mod I with grooming.    Status On-going               Plan - 10/25/16 1427    Clinical Impression Statement Pt today with arthritis pain in R hand.  Responded well to treatment today.    Rehab Potential Good   Current Impairments/barriers affecting progress: apraxia, global aphasia   OT Frequency 2x / week   OT Duration 8 weeks   OT Treatment/Interventions Self-care/ADL training;Therapeutic exercise;Neuromuscular education;DME and/or AE instruction;Passive range of motion;Manual Therapy;Splinting;Therapeutic activities;Patient/family education   Plan Assess arthritis pain, NMR for hand function via functional tasks, constrain Lhand, bilateral functional tasks   Consulted and Agree with Plan of Care Patient;Family member/caregiver   Family Member Consulted sister      Patient will benefit from skilled therapeutic intervention in order to improve the following deficits and impairments:  Decreased coordination, Decreased range of motion, Decreased strength, Impaired UE functional use, Impaired sensation  Visit Diagnosis: Apraxia  Hemiplegia and hemiparesis following cerebral infarction affecting right dominant side (HCC)  Muscle weakness  (generalized)  Other lack of coordination  Other disturbances of skin sensation    Problem List Patient Active Problem List   Diagnosis Date Noted  . Acute embolic stroke (HCC)   . Cerebrovascular accident (CVA) due to thrombosis of precerebral artery (HCC)   . Cerebrovascular accident (CVA) (HCC) 09/26/2016  . 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 [place], [place], [address] Phone: [phone]   Fax:  [phone]  Name: Johnny Sullivan MRN: [id number] Date of Birth: [birth year]

## 2016-10-27 ENCOUNTER — Ambulatory Visit: Payer: BLUE CROSS/BLUE SHIELD | Admitting: Speech Pathology

## 2016-10-27 DIAGNOSIS — I69820 Aphasia following other cerebrovascular disease: Secondary | ICD-10-CM

## 2016-10-27 DIAGNOSIS — R482 Apraxia: Secondary | ICD-10-CM

## 2016-10-27 DIAGNOSIS — I69351 Hemiplegia and hemiparesis following cerebral infarction affecting right dominant side: Secondary | ICD-10-CM | POA: Diagnosis not present

## 2016-10-27 NOTE — Therapy (Signed)
[facility] [address] [place], [place], [address] Phone: [phone]   Fax:  [phone]  Speech Language Pathology Treatment  Patient Details  Name: Johnny Sullivan MRN: [id number] Date of Birth: [birth year] Referring Provider: Dr. Marvel Plan  Encounter Date: 10/27/2016      End of Session - 10/27/16 1545    Visit Number 11   Number of Visits 17   Date for SLP Re-Evaluation 12/02/16   SLP Start Time 1402   SLP Stop Time  1443   SLP Time Calculation (min) 41 min   Activity Tolerance Patient tolerated treatment well      Past Medical History:  Diagnosis Date  . Arthritis    Osteoarthritis- right hip  . Coronary artery disease   . GERD (gastroesophageal reflux disease)   . Hypertension   . Myocardial infarction (HCC)    " mild"-Dr. Abbey Chatters follows -gives clearance.  Marland Kitchen Urethral stricture    'uses self [place] caths weekly" to correct.    Past Surgical History:  Procedure Laterality Date  . BACK SURGERY     lumbar discectomy  . CARDIAC CATHETERIZATION    . CORONARY ARTERY BYPASS GRAFT     09-20-2012 x3 -[place]  . IR PERCUTANEOUS ART THROMBECTOMY/INFUSION INTRACRANIAL INC DIAG ANGIO  09/26/2016  . LOOP RECORDER INSERTION N/A 09/30/2016   Procedure: Loop Recorder Insertion;  Surgeon: Hillis Range, MD;  [facility];  Service: Cardiovascular;  Laterality: N/A;  . RADIOLOGY WITH ANESTHESIA N/A 09/26/2016   Procedure: RADIOLOGY WITH ANESTHESIA;  Surgeon: Julieanne Cotton, MD;  Location: [facility] OR;  Service: Radiology;  Laterality: N/A;  . TEE WITHOUT CARDIOVERSION N/A 09/30/2016   Procedure: TRANSESOPHAGEAL ECHOCARDIOGRAM (TEE);  Surgeon: Laurey Morale, MD;  Location: [facility] ENDOSCOPY;  Service: Cardiovascular;  Laterality: N/A;  . TOTAL HIP ARTHROPLASTY Right 04/26/2016   Procedure: RIGHT TOTAL HIP ARTHROPLASTY ANTERIOR APPROACH;  Surgeon: Durene Romans, MD;  [facility]: [facility]  ORS;  Service: Orthopedics;  Laterality: Right;  Marland Kitchen VASECTOMY    . WRIST SURGERY     "implant of plastic bone"-mild ROM limits    There were no vitals filed for this visit.      Subjective Assessment - 10/27/16 1411    Subjective "He had trouble with some of the homework   Patient is accompained by: Family member   Special Tests son Alycia Rossetti               ADULT SLP TREATMENT - 10/27/16 1412      General Information   Behavior/Cognition Alert;Cooperative;Pleasant mood     Treatment Provided   Treatment provided Cognitive-Linquistic     Pain Assessment   Pain Assessment No/denies pain     Cognitive-Linquistic Treatment   Treatment focused on Aphasia;Apraxia;Patient/family/caregiver education   Skilled Treatment Pt making targeted phonemes in isolation and with cv syllabes with vowel /i, a, o, u and aI/ with consistent placement cues and in unison with ST fading out. Pt continues to require consistent max A for /k,g/ tactile, visual, placement cues. Phonemes continue to revert to nasal consonants usually.  Reading comprehension at simple phrase leve with usual min to mod A. Written expression at word level with frequent mod A.     Assessment / Recommendations / Plan   Plan Continue with current plan of care     Progression Toward Goals   Progression toward goals Progressing toward goals            SLP Short Term Goals -  10/27/16 1543      SLP SHORT TERM GOAL #1   Title Pt will answer simple yes/no questions accurately, either by head nod, pointing to the correct word, or verbalizing "yes" or "no"   Time 1   Period Weeks   Status On-going     SLP SHORT TERM GOAL #2   Title Pt will follow 1-step verbal directions consistently, given mod (verbal/visual/tactile) cues.   Time 1   Period Weeks   Status On-going     SLP SHORT TERM GOAL #3   Title Pt will imitate 3 sounds accurately given max visual and verbal cues   Status Achieved     SLP SHORT TERM GOAL #4    Title Pt will participate in further assessment of reading comprehension and written expression   Status Achieved     SLP SHORT TERM GOAL #5   Title Pt will demonstrate comprehension of short phrases with 80% accuracy, given mod cues   Baseline ID single word, fo2   Time 1   Period Weeks   Status Achieved          SLP Long Term Goals - 10/27/16 1544      SLP LONG TERM GOAL #1   Title Pt will answer complex yes/no questions accurately, either by head nod, pointing to the correct word, or verbalizing "yes" or "no"   Time 5   Period Weeks   Status On-going     SLP LONG TERM GOAL #2   Title Pt will follow 2-step verbal directions consistently, given min (verbal only) cues.   Time 5   Period Weeks   Status On-going     SLP LONG TERM GOAL #3   Title Pt will imitate 5 sounds accurately given max visual and verbal cues   Time 5   Period Weeks   Status On-going     SLP LONG TERM GOAL #4   Title Pt will demonstrate comprehension of sentence length material with 80% accuracy   Time 5   Period Weeks   Status On-going     SLP LONG TERM GOAL #5   Title Pt will write short phrases with 80% accuracy   Time 5   Period Weeks   Status On-going     SLP LONG TERM GOAL #6   Title Pt/family will report 25% improvement in expressive communication with use of AAC/multimodal means of communication.   Time 5   Period Weeks   Status On-going          Plan - 10/27/16 1541    Clinical Impression Statement Son Alycia RossettiRyan present today. Production of phonemes in isolation and cv syllables with less A from ST today. Reading comprehension at phrase level with frequent mod A. Spontaneous utterances continue to be perseverative and unintelligible. Continue skilled ST to maximize functional multimodal communication for QOL and to reduce caregiver burden.    Speech Therapy Frequency 3x / week   Treatment/Interventions Cueing hierarchy;SLP instruction and feedback;Language facilitation;Environmental  controls;Functional tasks;Compensatory strategies;Multimodal communcation approach;Patient/family education;Compensatory techniques   Potential to Achieve Goals Good   Potential Considerations Ability to learn/carryover information;Severity of impairments      Patient will benefit from skilled therapeutic intervention in order to improve the following deficits and impairments:   Aphasia following other cerebrovascular disease  Apraxia    Problem List Patient Active Problem List   Diagnosis Date Noted  . Acute embolic stroke (HCC)   . Cerebrovascular accident (CVA) due to thrombosis of precerebral artery (HCC)   .  Cerebrovascular accident (CVA) (HCC) 09/26/2016  . 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

## 2016-10-27 NOTE — Therapy (Signed)
[facility] [address] [place], [place], [address] Phone: [phone]   Fax:  [phone]  Speech Language Pathology Treatment  Patient Details  Name: Johnny Sullivan MRN: [id number] Date of Birth: 03-02-1956 Referring Provider: Dr. Marvel Plan  Encounter Date: 10/25/2016    Past Medical History:  Diagnosis Date  . Arthritis    Osteoarthritis- right hip  . Coronary artery disease   . GERD (gastroesophageal reflux disease)   . Hypertension   . Myocardial infarction (HCC)    " mild"-Dr. Abbey Chatters follows -gives clearance.  Marland Kitchen Urethral stricture    'uses self [place] caths weekly" to correct.    Past Surgical History:  Procedure Laterality Date  . BACK SURGERY     lumbar discectomy  . CARDIAC CATHETERIZATION    . CORONARY ARTERY BYPASS GRAFT     09-20-2012 x3 -[place]  . IR PERCUTANEOUS ART THROMBECTOMY/INFUSION INTRACRANIAL INC DIAG ANGIO  09/26/2016  . LOOP RECORDER INSERTION N/A 09/30/2016   Procedure: Loop Recorder Insertion;  Surgeon: Hillis Range, MD;  [facility];  Service: Cardiovascular;  Laterality: N/A;  . RADIOLOGY WITH ANESTHESIA N/A 09/26/2016   Procedure: RADIOLOGY WITH ANESTHESIA;  Surgeon: Julieanne Cotton, MD;  Location: [facility] OR;  Service: Radiology;  Laterality: N/A;  . TEE WITHOUT CARDIOVERSION N/A 09/30/2016   Procedure: TRANSESOPHAGEAL ECHOCARDIOGRAM (TEE);  Surgeon: Laurey Morale, MD;  Location: [facility] ENDOSCOPY;  Service: Cardiovascular;  Laterality: N/A;  . TOTAL HIP ARTHROPLASTY Right 04/26/2016   Procedure: RIGHT TOTAL HIP ARTHROPLASTY ANTERIOR APPROACH;  Surgeon: Durene Romans, MD;  Location: [facility] ORS;  Service: Orthopedics;  Laterality: Right;  Marland Kitchen VASECTOMY    . WRIST SURGERY     "implant of plastic bone"-mild ROM limits    There were no vitals filed for this visit.     10/25/16 1407  Symptoms/Limitations  Subjective "Nain"  Patient is  accompained by: Family member  Special Tests sister [name]  Pain Assessment  Currently in Pain? No/denies     10/25/16 1408  General Information  Behavior/Cognition Alert;Cooperative;Pleasant mood  Patient Positioning Upright in chair  Oral care provided N/A  Treatment Provided  Treatment provided Cognitive-Linquistic  Pain Assessment  Pain Assessment No/denies pain  Cognitive-Linquistic Treatment  Treatment focused on Aphasia;Apraxia;Patient/family/caregiver education  Skilled Treatment Y/N questions with repetition 50% accuracy. SLP targeted voiceless consonants /p, t, k, s, f/; pt with good awareness of errors and self-cues/corrects using cell phone videos. Imitates vowels /a, u, aI, o/ with consistent unison and placement cues. SLP provided visual, placement and tactile cues to elicit /i/ production with max cues, fading to mod cues and pt with 50% accuracy. SLP facilitated CVC shaping pt?Ts response ?onain? to /fain/(fine). Pt with 25% success, max cues, with initial voiceless consonant becoming voiced in errors. Reading comprehension at simple phrase level with occasional mod A - homework provided reading comprehension at 3-4 words.  Assessment / Recommendations / Plan  Plan Continue with current plan of care  Progression Toward Goals  Progression toward goals Progressing toward goals     10/25/16 1408  Education  Education provided Yes  PT Education  Education Details difference between apraxia and aphasia, music/singing different from speech, but pt can use familiar/favorite songs to feel rhythm  Person(s) Educated Patient;Other (comment) (sister [name])  Methods Explanation  Comprehension Verbalized understanding      10/25/16 1408  SLP Visits / Re-Eval  Visit Number 10  Number of Visits 17  Date for SLP Re-Evaluation 12/02/16  SLP  Time Calculation  SLP Start Time 1403  SLP Stop Time  1445  SLP Time Calculation (min) 42 min  SLP - End of Session  Activity  Tolerance Patient tolerated treatment well     10/25/16 1408  SLP Assessment/Plan  Clinical Impression Statement Pt continues to work hard in therapy. Sister, [name] present today.  He continues to make gradual gains, with excellent family assistance and carryover of home practice. Social responses and spontaneous utterances continue to be perseverative nasal consonants. Auditory and reading comprehension at sentence leve with max A. Continue skilled ST to maximize functional communication for QOL and to reduce caregiver burden.   Speech Therapy Frequency 3x / week  Treatment/Interventions Cueing hierarchy;SLP instruction and feedback;Language facilitation;Environmental controls;Functional tasks;Compensatory strategies;Multimodal communcation approach;Patient/family education;Compensatory techniques  Potential to Achieve Goals Good  Potential Considerations Ability to learn/carryover information;Severity of impairments  SLP Home Exercise Plan reviewed  Consulted and Agree with Plan of Care Patient  Family Member Consulted sister [name]          SLP Short Term Goals - 10/25/16 1408      SLP SHORT TERM GOAL #1   Title Pt will answer simple yes/no questions accurately, either by head nod, pointing to the correct word, or verbalizing "yes" or "no"   Time 1   Period Weeks   Status On-going     SLP SHORT TERM GOAL #2   Title Pt will follow 1-step verbal directions consistently, given mod (verbal/visual/tactile) cues.   Time 1   Period Weeks   Status On-going     SLP SHORT TERM GOAL #3   Title Pt will imitate 3 sounds accurately given max visual and verbal cues   Status Achieved     SLP SHORT TERM GOAL #4   Title Pt will participate in further assessment of reading comprehension and written expression   Status Achieved     SLP SHORT TERM GOAL #5   Title Pt will demonstrate comprehension of short phrases with 80% accuracy, given mod cues   Baseline ID single word, fo2   Time 1    Period Weeks   Status On-going          SLP Long Term Goals - 10/25/16 1408      SLP LONG TERM GOAL #1   Time 5   Period Weeks   Status On-going     SLP LONG TERM GOAL #2   Title Pt will follow 2-step verbal directions consistently, given min (verbal only) cues.   Time 5   Period Weeks   Status On-going     SLP LONG TERM GOAL #3   Title Pt will imitate 5 sounds accurately given max visual and verbal cues   Time 5   Period Weeks   Status On-going     SLP LONG TERM GOAL #4   Title Pt will demonstrate comprehension of sentence length material with 80% accuracy   Time 5   Period Weeks   Status On-going     SLP LONG TERM GOAL #5   Title Pt will write short phrases with 80% accuracy   Time 5   Period Weeks   Status On-going     SLP LONG TERM GOAL #6   Title Pt/family will report 25% improvement in expressive communication with use of AAC/multimodal means of communication.   Time 5   Period Weeks   Status On-going        Patient will benefit from skilled therapeutic intervention in order to improve  the following deficits and impairments:   Apraxia  Aphasia following other cerebrovascular disease    Problem List Patient Active Problem List   Diagnosis Date Noted  . Acute embolic stroke (HCC)   . Cerebrovascular accident (CVA) due to thrombosis of precerebral artery (HCC)   . Cerebrovascular accident (CVA) (HCC) 09/26/2016  . 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  Objective Measurements: Pt has achieved 2/4 short term goals, progressing toward all goals.   Goal Update: Progressing toward goals  Plan: Continue as written; please see clinical impressions above for details.  Reason Skilled Services are Required: Continue skilled ST to maximize functional communication for QOL and to  reduce caregiver burden.  Rondel Baton, [place], CCC-SLP Speech-Language Pathologist   Arlana Lindau 10/27/2016, 7:20 AM  [facility] [address] [place], [place], [address] Phone: [phone]   Fax:  [phone]   Name: Juanluis Kero MRN: [id number] Date of Birth: [birth year]

## 2016-10-31 ENCOUNTER — Ambulatory Visit: Payer: BLUE CROSS/BLUE SHIELD | Admitting: Occupational Therapy

## 2016-10-31 ENCOUNTER — Ambulatory Visit: Payer: BLUE CROSS/BLUE SHIELD | Admitting: Speech Pathology

## 2016-10-31 ENCOUNTER — Encounter: Payer: Self-pay | Admitting: Occupational Therapy

## 2016-10-31 ENCOUNTER — Ambulatory Visit (INDEPENDENT_AMBULATORY_CARE_PROVIDER_SITE_OTHER): Payer: BLUE CROSS/BLUE SHIELD | Admitting: *Deleted

## 2016-10-31 DIAGNOSIS — R482 Apraxia: Secondary | ICD-10-CM

## 2016-10-31 DIAGNOSIS — R208 Other disturbances of skin sensation: Secondary | ICD-10-CM

## 2016-10-31 DIAGNOSIS — I69351 Hemiplegia and hemiparesis following cerebral infarction affecting right dominant side: Secondary | ICD-10-CM | POA: Diagnosis not present

## 2016-10-31 DIAGNOSIS — R278 Other lack of coordination: Secondary | ICD-10-CM

## 2016-10-31 DIAGNOSIS — M6281 Muscle weakness (generalized): Secondary | ICD-10-CM

## 2016-10-31 DIAGNOSIS — I639 Cerebral infarction, unspecified: Secondary | ICD-10-CM

## 2016-10-31 DIAGNOSIS — I69820 Aphasia following other cerebrovascular disease: Secondary | ICD-10-CM

## 2016-10-31 NOTE — Therapy (Signed)
[facility] [address] [place], [place], [address] Phone: [phone]   Fax:  [phone]  Occupational Therapy Treatment  Patient Details  Name: Johnny Sullivan MRN: [id number] Date of Birth: [birth year] Referring Provider: [name]  Encounter Date: 10/31/2016      OT End of Session - 10/31/16 1643    Visit Number 8   Number of Visits 16   Date for OT Re-Evaluation 11/29/16   Authorization Type BCBS- no visit limitations   OT Start Time 1316   OT Stop Time 1359   OT Time Calculation (min) 43 min   Activity Tolerance Patient tolerated treatment well      Past Medical History:  Diagnosis Date  . Arthritis    Osteoarthritis- right hip  . Coronary artery disease   . GERD (gastroesophageal reflux disease)   . Hypertension   . Myocardial infarction (HCC)    " mild"-Dr. Abbey Chatters follows -gives clearance.  Marland Kitchen Urethral stricture    'uses self [place] caths weekly" to correct.    Past Surgical History:  Procedure Laterality Date  . BACK SURGERY     lumbar discectomy  . CARDIAC CATHETERIZATION    . CORONARY ARTERY BYPASS GRAFT     09-20-2012 x3 -[place]  . IR PERCUTANEOUS ART THROMBECTOMY/INFUSION INTRACRANIAL INC DIAG ANGIO  09/26/2016  . LOOP RECORDER INSERTION N/A 09/30/2016   Procedure: Loop Recorder Insertion;  Surgeon: Hillis Range, MD;  [facility] IN[facility];  Service: Cardiovascular;  Laterality: N/A;  . RADIOLOGY WITH ANESTHESIA N/A 09/26/2016   Procedure: RADIOLOGY WITH ANESTHESIA;  Surgeon: Julieanne Cotton, MD;  Location: [facility] OR;  Service: Radiology;  Laterality: N/A;  . TEE WITHOUT CARDIOVERSION N/A 09/30/2016   Procedure: TRANSESOPHAGEAL ECHOCARDIOGRAM (TEE);  Surgeon: Laurey Morale, MD;  Location: [facility] ENDOSCOPY;  Service: Cardiovascular;  Laterality: N/A;  . TOTAL HIP ARTHROPLASTY Right 04/26/2016   Procedure: RIGHT TOTAL HIP ARTHROPLASTY ANTERIOR APPROACH;  Surgeon:  Durene Romans, MD;  Location: [facility] ORS;  Service: Orthopedics;  Laterality: Right;  Marland Kitchen VASECTOMY    . WRIST SURGERY     "implant of plastic bone"-mild ROM limits    There were no vitals filed for this visit.      Subjective Assessment - 10/31/16 1319    Subjective  Fine asked how he was   Pertinent History see epic . 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   Patient Stated Goals unable to state - pointed to right hand    Currently in Pain? Yes   Pain Score 2   indicated just a little pain in his R hand today - attempting to say 2/10   Pain Location Hand   Pain Orientation Right   Pain Descriptors / Indicators Aching;Sore   Pain Type Chronic pain   Pain Onset Other (comment)  h/o of arthritis pain in R hand   Pain Frequency Intermittent   Aggravating Factors  h/o of arthritis and no longer able to take daily NSAID.     Pain Relieving Factors heat followed by stretching exercises followed by ice appeared to help;                      OT Treatments/Exercises (OP) - 10/31/16 0001      ADLs   ADL Comments Assessed box and blocks (45) today -pt unable to do at all at evaluation. Also assessed 9 hole peg - pt able to complete in 1.25.89- unable to pick  up even one peg during eval.      Neurological Re-education Exercises   Other Exercises 1 Utlized functional tasks (i.e. self feeding with focus on picking and orienting utensils in hand correctly, unloading dishwasher only using R hand to focus on forced use and motor planning).  ALso addressed unilateral functional use of RUE with game with small pieces that pt had to pick and place appropriately. Pt with slowly improving motor planning and functional use of R hand.                  OT Short Term Goals - 10/31/16 1641      OT SHORT TERM GOAL #1   Title Pt and wife will be mod I with HEP - 11/01/2016   Status Achieved     OT SHORT TERM GOAL #2   Title Pt will demonstrate improved grip strength  by at least 5 pounds to assist with functional activities.  (baseline= 10 pounds)   Status Achieved  35     OT SHORT TERM GOAL #3   Title Pt will demonstrate ability to pick up small light weight object with 2 or 3 point pinch with min a.   Status Achieved     OT SHORT TERM GOAL #4   Title Pt will be mod I with bathing at shower level   Status Achieved     OT SHORT TERM GOAL #5   Title Pt will demonstrate ability to use RUE as gross assist for basic ADL tasks.    Status Achieved           OT Long Term Goals - 10/31/16 1641      OT LONG TERM GOAL #1   Title Pt and wife will be mod I with upgraded HEP - 11/29/2016   Status On-going     OT LONG TERM GOAL #2   Title Pt will demonstrate increased grip strength by 8 pounds to assist with functional tasks (baseline= 10 pounds)   Status Achieved  60 pounds      OT LONG TERM GOAL #3   Title Pt will demonstrate ability to use RUE as non dominant during basic ADL tasks at least 50% of the time.    Status On-going     OT LONG TERM GOAL #4   Title Pt will be able to eat with R hand as dominant hand    Status On-going     OT LONG TERM GOAL #5   Title Pt will be mod I with dressing    Status On-going     OT LONG TERM GOAL #6   Title Pt will be mod I with grooming.    Status On-going               Plan - 10/31/16 1642    Clinical Impression Statement Pt continues to make good progress with functional use of RUE. Pt with very supportive family   Rehab Potential Good   Current Impairments/barriers affecting progress: apraxia, global aphasia   OT Frequency 2x / week   OT Duration 8 weeks   OT Treatment/Interventions Self-care/ADL training;Therapeutic exercise;Neuromuscular education;DME and/or AE instruction;Passive range of motion;Manual Therapy;Splinting;Therapeutic activities;Patient/family education   Plan NMR for hand function via functional tasks, constrain L hand, bilateral functional tasks - begin to assess LTG's.     Consulted and Agree with Plan of Care Patient;Family member/caregiver   Family Member Consulted dtr      Patient will benefit from skilled therapeutic intervention in  order to improve the following deficits and impairments:  Decreased coordination, Decreased range of motion, Decreased strength, Impaired UE functional use, Impaired sensation  Visit Diagnosis: Apraxia  Hemiplegia and hemiparesis following cerebral infarction affecting right dominant side (HCC)  Muscle weakness (generalized)  Other lack of coordination  Other disturbances of skin sensation    Problem List Patient Active Problem List   Diagnosis Date Noted  . Acute embolic stroke (HCC)   . Cerebrovascular accident (CVA) due to thrombosis of precerebral artery (HCC)   . Cerebrovascular accident (CVA) (HCC) 09/26/2016  . 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

## 2016-10-31 NOTE — Therapy (Signed)
[facility] [address] [place], [place], [address] Phone: [phone]   Fax:  [phone]  Speech Language Pathology Treatment  Patient Details  Name: Johnny Sullivan MRN: [id number] Date of Birth: 28-Jun-1955 Referring Provider: Dr. Marvel Plan  Encounter Date: 10/31/2016      End of Session - 10/31/16 1923    Visit Number 12   Number of Visits 17   Date for SLP Re-Evaluation 12/02/16   SLP Start Time 1402   SLP Stop Time  1448   SLP Time Calculation (min) 46 min   Activity Tolerance Patient tolerated treatment well      Past Medical History:  Diagnosis Date  . Arthritis    Osteoarthritis- right hip  . Coronary artery disease   . GERD (gastroesophageal reflux disease)   . Hypertension   . Myocardial infarction (HCC)    " mild"-Dr. Abbey Chatters follows -gives clearance.  Marland Kitchen Urethral stricture    'uses self [place] caths weekly" to correct.    Past Surgical History:  Procedure Laterality Date  . BACK SURGERY     lumbar discectomy  . CARDIAC CATHETERIZATION    . CORONARY ARTERY BYPASS GRAFT     09-20-2012 x3 -[place]  . IR PERCUTANEOUS ART THROMBECTOMY/INFUSION INTRACRANIAL INC DIAG ANGIO  09/26/2016  . LOOP RECORDER INSERTION N/A 09/30/2016   Procedure: Loop Recorder Insertion;  Surgeon: Hillis Range, MD;  [facility] LAB;  Service: Cardiovascular;  Laterality: N/A;  . RADIOLOGY WITH ANESTHESIA N/A 09/26/2016   Procedure: RADIOLOGY WITH ANESTHESIA;  Surgeon: Julieanne Cotton, MD;  Location: [facility] OR;  Service: Radiology;  Laterality: N/A;  . TEE WITHOUT CARDIOVERSION N/A 09/30/2016   Procedure: TRANSESOPHAGEAL ECHOCARDIOGRAM (TEE);  Surgeon: Laurey Morale, MD;  Location: [facility] ENDOSCOPY;  Service: Cardiovascular;  Laterality: N/A;  . TOTAL HIP ARTHROPLASTY Right 04/26/2016   Procedure: RIGHT TOTAL HIP ARTHROPLASTY ANTERIOR APPROACH;  Surgeon: Durene Romans, MD;  [facility]  ORS;  Service: Orthopedics;  Laterality: Right;  Marland Kitchen VASECTOMY    . WRIST SURGERY     "implant of plastic bone"-mild ROM limits    There were no vitals filed for this visit.             ADULT SLP TREATMENT - 10/31/16 1913      General Information   Behavior/Cognition Alert;Cooperative;Pleasant mood     Treatment Provided   Treatment provided Cognitive-Linquistic     Pain Assessment   Pain Assessment No/denies pain     Cognitive-Linquistic Treatment   Treatment focused on Aphasia;Apraxia;Patient/family/caregiver education   Skilled Treatment Facilitated multi modal communication/written expression, initally with writing names of basic objects, then writing word in response to questions, with rare min A and 85% accuracy. Phrase level writing using present perfect verbs to picture and questions required usual mod A for phrase, as pt continued to write at word level - aphasic errors increased in simple phrases, pt indicated error awareness with rare min A. Consistent max A to combine phonemes for cvc syllables and alterate or combine vowels. Social responses and simple conversation remian severely apraxic with repetition of nasal sounds.      Assessment / Recommendations / Plan   Plan Continue with current plan of care     Progression Toward Goals   Progression toward goals Progressing toward goals          SLP Education - 10/31/16 1918    Education provided Yes   Education Details encourage spontaneous written responses and written requests  to augment attempts at verbal communication   Person(s) Educated Patient;Child(ren)   Methods Demonstration;Verbal cues   Comprehension Verbalized understanding;Returned demonstration;Verbal cues required          SLP Short Term Goals - 10/31/16 1922      SLP SHORT TERM GOAL #1   Title Pt will answer simple yes/no questions accurately, either by head nod, pointing to the correct word, or verbalizing "yes" or "no"   Time 1    Period Weeks   Status On-going     SLP SHORT TERM GOAL #2   Title Pt will follow 1-step verbal directions consistently, given mod (verbal/visual/tactile) cues.   Time 1   Period Weeks   Status On-going     SLP SHORT TERM GOAL #3   Title Pt will imitate 3 sounds accurately given max visual and verbal cues   Status Achieved     SLP SHORT TERM GOAL #4   Title Pt will participate in further assessment of reading comprehension and written expression   Status Achieved     SLP SHORT TERM GOAL #5   Title Pt will demonstrate comprehension of short phrases with 80% accuracy, given mod cues   Baseline ID single word, fo2   Time 1   Period Weeks   Status Achieved          SLP Long Term Goals - 10/31/16 1923      SLP LONG TERM GOAL #1   Title Pt will answer complex yes/no questions accurately, either by head nod, pointing to the correct word, or verbalizing "yes" or "no"   Time 4   Period Weeks   Status On-going     SLP LONG TERM GOAL #2   Title Pt will follow 2-step verbal directions consistently, given min (verbal only) cues.   Time 4   Period Weeks   Status On-going     SLP LONG TERM GOAL #3   Title Pt will imitate 5 sounds accurately given max visual and verbal cues   Time 4   Period Weeks   Status On-going     SLP LONG TERM GOAL #4   Title Pt will demonstrate comprehension of sentence length material with 80% accuracy   Time 4   Period Weeks   Status On-going     SLP LONG TERM GOAL #5   Title Pt will write short phrases with 80% accuracy   Time 4   Period Weeks   Status On-going     SLP LONG TERM GOAL #6   Title Pt/family will report 25% improvement in expressive communication with use of AAC/multimodal means of communication.   Time 5   Period Weeks   Status On-going          Plan - 10/31/16 1919    Clinical Impression Statement Daughter [name] present today - focus on multi modal communciation with written responses to questions at word level with min  A. Phrase level responses (2-3 words with verbs) with usual mod to max A,. Consistent max A to imitated cvc syllables and to combine or alternate vowels. Daughter does report increased success of written expression to augment attempts at verbal expression. Severe verbal apraxia persisits with spontaneous and automatic speech unintelligible with perseveration on nasal cv utterances. Continue skilled ST to maximize communcation for independence and QOL and to reduce caregiver burden   Speech Therapy Frequency 3x / week   Treatment/Interventions Cueing hierarchy;SLP instruction and feedback;Language facilitation;Environmental controls;Functional tasks;Compensatory strategies;Multimodal communcation approach;Patient/family education;Compensatory techniques  Potential to Achieve Goals Fair   Potential Considerations Ability to learn/carryover information;Severity of impairments      Patient will benefit from skilled therapeutic intervention in order to improve the following deficits and impairments:   Apraxia  Aphasia following other cerebrovascular disease    Problem List Patient Active Problem List   Diagnosis Date Noted  . Acute embolic stroke (HCC)   . Cerebrovascular accident (CVA) due to thrombosis of precerebral artery (HCC)   . Cerebrovascular accident (CVA) (HCC) 09/26/2016  . 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 [place], [place], [address] Phone: [phone]   Fax:  [phone]   Name: Johnny Sullivan MRN: [id number] Date of Birth: 12-13-1955

## 2016-11-01 NOTE — Progress Notes (Signed)
[facility] Summary Report / Loop Recorder 

## 2016-11-03 ENCOUNTER — Encounter: Payer: BLUE CROSS/BLUE SHIELD | Admitting: Occupational Therapy

## 2016-11-03 ENCOUNTER — Ambulatory Visit: Payer: BLUE CROSS/BLUE SHIELD | Admitting: Occupational Therapy

## 2016-11-03 ENCOUNTER — Encounter: Payer: Self-pay | Admitting: Occupational Therapy

## 2016-11-03 ENCOUNTER — Ambulatory Visit: Payer: BLUE CROSS/BLUE SHIELD | Admitting: Speech Pathology

## 2016-11-03 DIAGNOSIS — R208 Other disturbances of skin sensation: Secondary | ICD-10-CM

## 2016-11-03 DIAGNOSIS — M6281 Muscle weakness (generalized): Secondary | ICD-10-CM

## 2016-11-03 DIAGNOSIS — R482 Apraxia: Secondary | ICD-10-CM

## 2016-11-03 DIAGNOSIS — R278 Other lack of coordination: Secondary | ICD-10-CM

## 2016-11-03 DIAGNOSIS — I69820 Aphasia following other cerebrovascular disease: Secondary | ICD-10-CM

## 2016-11-03 DIAGNOSIS — I69351 Hemiplegia and hemiparesis following cerebral infarction affecting right dominant side: Secondary | ICD-10-CM | POA: Diagnosis not present

## 2016-11-03 NOTE — Therapy (Signed)
[facility] [address] [place], [place], [address] Phone: [phone]   Fax:  [phone]  Occupational Therapy Treatment  Patient Details  Name: Johnny Sullivan MRN: [id number] Date of Birth: 11-25-1955 Referring Provider: Dr. Erlinda Hong  Encounter Date: 11/03/2016      OT End of Session - 11/03/16 1613    Visit Number 9   Number of Visits 16   Date for OT Re-Evaluation 11/29/16   Authorization Type BCBS- no visit limitations   OT Start Time 1450   OT Stop Time 1540   OT Time Calculation (min) 50 min      Past Medical History:  Diagnosis Date  . Arthritis    Osteoarthritis- right hip  . Coronary artery disease   . GERD (gastroesophageal reflux disease)   . Hypertension   . Myocardial infarction ([place])    " mild"-Dr. Debbora Lacrosse follows -gives clearance.  Marland Kitchen Urethral stricture    'uses self [place] caths weekly" to correct.    Past Surgical History:  Procedure Laterality Date  . BACK SURGERY     lumbar discectomy  . CARDIAC CATHETERIZATION    . CORONARY ARTERY BYPASS GRAFT     09-20-2012 x3 -[place]  . IR PERCUTANEOUS ART THROMBECTOMY/INFUSION INTRACRANIAL INC DIAG ANGIO  09/26/2016  . LOOP RECORDER INSERTION N/A 09/30/2016   Procedure: Loop Recorder Insertion;  Surgeon: Thompson Grayer, MD;  Location: [facility] CV LAB;  Service: Cardiovascular;  Laterality: N/A;  . RADIOLOGY WITH ANESTHESIA N/A 09/26/2016   Procedure: RADIOLOGY WITH ANESTHESIA;  Surgeon: Luanne Bras, MD;  Location: [facility];  Service: Radiology;  Laterality: N/A;  . TEE WITHOUT CARDIOVERSION N/A 09/30/2016   Procedure: TRANSESOPHAGEAL ECHOCARDIOGRAM (TEE);  Surgeon: Larey Dresser, MD;  Location: [facility] ENDOSCOPY;  Service: Cardiovascular;  Laterality: N/A;  . TOTAL HIP ARTHROPLASTY Right 04/26/2016   Procedure: RIGHT TOTAL HIP ARTHROPLASTY ANTERIOR APPROACH;  Surgeon: Paralee Cancel, MD;  Location: [facility] ORS;  Service:  Orthopedics;  Laterality: Right;  Marland Kitchen VASECTOMY    . WRIST SURGERY     "implant of plastic bone"-mild ROM limits    There were no vitals filed for this visit.      Subjective Assessment - 11/03/16 1451    Subjective  Good when asked how he was today   Patient is accompained by: Family member  sister   Pertinent History see epic . 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   Patient Stated Goals unable to state - pointed to right hand    Currently in Pain? Yes   Pain Score 2    Pain Location Hand   Pain Orientation Right   Pain Descriptors / Indicators Aching   Pain Type Chronic pain   Pain Onset Other (comment)  h/o of arthitis in R hand and no longer able to take NSAIDS   Pain Frequency Intermittent   Aggravating Factors  h/o of arthritis and no longer able to take daily NSAID   Pain Relieving Factors heat followed by stretching exercises followed by ice appeard to help                      OT Treatments/Exercises (OP) - 11/03/16 0001      ADLs   Driving Wife asked for information on return to driving. Provided written information as well as verbally reviewed options and dicussed beneifts of formal driving evaluation especially given pt's language and potential cogntive issues (wife feels pt does have some  cognitive deficits). Wife to follow through with MD order in the future when she feels pt is ready.  WIfe in agreement with plan for driving eval. Pt rolled eyes but then nodded yes when asked if he would do it.    ADL Comments Assessed remaining LTG's.  Pt has met or exceeded all goals - see goals for update.  Shared status with pt and wife.  Pt and wife both indicated that they very pleased with improvement in hand function and independence in functional tasks. WIfe reports pt is using RUE primarily as dominant hand at this point with only occassional assist from L hand.       Hand Exercises   Other Hand Exercises Following paraffin and manual  therapy followed up with gentle ROM exercises as well as tendon glides. At end of session used ice massage and pt able to indicated 0/10 pain by writing score on paper.      Modalities   Modalities Cryotherapy;Paraffin     Cryotherapy   Number Minutes Cryotherapy 5 Minutes   Cryotherapy Location Hand   Type of Cryotherapy Ice massage     Manual Therapy   Manual Therapy Joint mobilization;Soft tissue mobilization   Manual therapy comments joint mob and soft tissue mob after paraffin to address tightness and pain (pt able to indicated 2/10) by writing number on paper) in R hand. At end of session pt reported 0/10 pain.                 OT Education - 11/03/16 1611    Education provided Yes   Education Details driving evaluation    Person(s) Educated Patient;Spouse   Methods Explanation;Handout   Comprehension Verbalized understanding  wife          OT Short Term Goals - 11/03/16 1611      OT SHORT TERM GOAL #1   Title Pt and wife will be mod I with HEP - 11/01/2016   Status Achieved     OT SHORT TERM GOAL #2   Title Pt will demonstrate improved grip strength by at least 5 pounds to assist with functional activities.  (baseline= 10 pounds)   Status Achieved  35     OT SHORT TERM GOAL #3   Title Pt will demonstrate ability to pick up small light weight object with 2 or 3 point pinch with min a.   Status Achieved     OT SHORT TERM GOAL #4   Title Pt will be mod I with bathing at shower level   Status Achieved     OT SHORT TERM GOAL #5   Title Pt will demonstrate ability to use RUE as gross assist for basic ADL tasks.    Status Achieved           OT Long Term Goals - 11/03/16 1611      OT LONG TERM GOAL #1   Title Pt and wife will be mod I with upgraded HEP - 11/29/2016   Status Achieved     OT LONG TERM GOAL #2   Title Pt will demonstrate increased grip strength by 8 pounds to assist with functional tasks (baseline= 10 pounds)   Status Achieved  60  pounds      OT LONG TERM GOAL #3   Title Pt will demonstrate ability to use RUE as non dominant during basic ADL tasks at least 50% of the time.    Status Achieved     OT LONG TERM GOAL #4  Title Pt will be able to eat with R hand as dominant hand    Status Achieved     OT LONG TERM GOAL #5   Title Pt will be mod I with dressing    Status Achieved     OT LONG TERM GOAL #6   Title Pt will be mod I with grooming.    Status Achieved               Plan - 11/03/16 1612    Clinical Impression Statement Pt has made excellent progress and has met or exceeded all goals for OT. Pt is ready for d/c from OT. PT and wife in agreement.    Rehab Potential Good   Current Impairments/barriers affecting progress: apraxia, global aphasia   OT Frequency 2x / week   OT Duration 8 weeks   OT Treatment/Interventions Self-care/ADL training;Therapeutic exercise;Neuromuscular education;DME and/or AE instruction;Passive range of motion;Manual Therapy;Splinting;Therapeutic activities;Patient/family education   Plan d/c from OT   Consulted and Agree with Plan of Care Patient;Family member/caregiver   Family Member Consulted wife      Patient will benefit from skilled therapeutic intervention in order to improve the following deficits and impairments:  Decreased coordination, Decreased range of motion, Decreased strength, Impaired UE functional use, Impaired sensation  Visit Diagnosis: Apraxia  Hemiplegia and hemiparesis following cerebral infarction affecting right dominant side (HCC)  Muscle weakness (generalized)  Other lack of coordination  Other disturbances of skin sensation    Problem List Patient Active Problem List   Diagnosis Date Noted  . Acute embolic stroke ([place])   . Cerebrovascular accident (CVA) due to thrombosis of precerebral artery ([place])   . Cerebrovascular accident (CVA) ([place]) 09/26/2016  . S/P right THA, AA 04/26/2016   OCCUPATIONAL THERAPY DISCHARGE  SUMMARY  Visits from Start of Care: 9  Current functional level related to goals / functional outcomes: seeabove   Remaining deficits: Decreased coordination RUE, impaired sensation RUE, global aphasia   Education / Equipment: HEP Plan: Patient agrees to discharge.  Patient goals were partially met. Patient is being discharged due to meeting the stated rehab goals.  ?????       Quay Burow, OTR/L 11/03/2016, 4:15 PM  [facility] [address] [place], [place], [address] Phone: [phone]   Fax:  [phone]  Name: Johnny Sullivan MRN: [id number] Date of Birth: 06-May-1956

## 2016-11-03 NOTE — Patient Instructions (Signed)
Local Driver Evaluation Programs: ° °Comprehensive Evaluation: includes clinical and in vehicle behind the wheel testing by OCCUPATIONAL THERAPIST. Programs have varying levels of adaptive controls available for trial.  ° °[facility] °[address] °[place], [place]  [address] °[phone] or [phone] °http://www.driver-rehab.com °Evaluator:  Cyndee Crompton, OT/CDRS/CDI/SCDCM/Low Vision Certification ° °[facility] °[address] °[place], [place] [address] °[phone] °https://www.[facility].org/home/services/rehabilitation.aspx °Evaluators:  Shannon Sheek, OT and Jill Tucker, OT ° °[facility] [place] [facility] (ONLY SERVES VETERANS!!) °[facility] °[address] °[place], [place]  [address] °[phone] °http://www.[place].va.gov/services/Physical_Medicine_Rehabilitation_Services.asp °Evaluators:  Eric Andrews, KT; Heidi Harris, KT;  Gary Whitaker, KT (KT=kiniesotherapist) ° ° °Clinical evaluations only:  Includes clinical testing, refers to other programs or local certified driving instructor for behind the wheel testing. ° °[facility] (outpatient Rehab) °[facility] °[address] °[place], [place] [address] °[phone] for scheduling °http://www.[facility].edu/Outpatient-Rehabilitation/Neurorehabilitation-Therapy.htm °Evaluators:  Kelly Lambeth, OT; Kate Phillips, OT ° °Other area clinical evaluators available upon request including [facility], [facility] and [facility]. ° ° °    Resource List °What is a Driver Evaluation: °Your Road Ahead - A Guide to Comprehensive Driving Evaluations °http://www.thehartford.com/resources/mature-market-excellence/publications-on-aging ° °Association for [employer] [facility] [employer] - Disability and Driving Fact Sheets °http://www.aded.net/?page=510 ° °Driving after a Brain  Injury: °[employer] °http://www.biausa.org/tbims-abstracts/if-there-is-an-effective-way-to-determine-if-someone-is-ready-to-drive-after-tbi?A=SearchResult&SearchID=9495675&ObjectID=2758842&ObjectType=35 ° °Driving with Adaptive Equipment: °[facility] °http://www.driver-rehab.com/adaptive-equipment ° °[employer] °http://www.nmeda.com/ ° ° ° ° ° ° °  °

## 2016-11-03 NOTE — Therapy (Signed)
[facility] [address] [place], [place], [address] Phone: [phone]   Fax:  [phone]  Speech Language Pathology Treatment  Patient Details  Name: Johnny Sullivan MRN: [id number] Date of Birth: Apr 23, 1956 Referring Provider: Dr. Rosalin Hawking  Encounter Date: 11/03/2016      End of Session - 11/03/16 1417    Visit Number 13   Number of Visits 17   Date for SLP Re-Evaluation 12/02/16   SLP Start Time 1316   SLP Stop Time  1400   SLP Time Calculation (min) 44 min   Activity Tolerance Patient tolerated treatment well      Past Medical History:  Diagnosis Date  . Arthritis    Osteoarthritis- right hip  . Coronary artery disease   . GERD (gastroesophageal reflux disease)   . Hypertension   . Myocardial infarction ([place])    " mild"-Dr. Debbora Lacrosse follows -gives clearance.  Marland Kitchen Urethral stricture    'uses self [place] caths weekly" to correct.    Past Surgical History:  Procedure Laterality Date  . BACK SURGERY     lumbar discectomy  . CARDIAC CATHETERIZATION    . CORONARY ARTERY BYPASS GRAFT     09-20-2012 x3 -[place]  . IR PERCUTANEOUS ART THROMBECTOMY/INFUSION INTRACRANIAL INC DIAG ANGIO  09/26/2016  . LOOP RECORDER INSERTION N/A 09/30/2016   Procedure: Loop Recorder Insertion;  Surgeon: Thompson Grayer, MD;  Location: [facility];  Service: Cardiovascular;  Laterality: N/A;  . RADIOLOGY WITH ANESTHESIA N/A 09/26/2016   Procedure: RADIOLOGY WITH ANESTHESIA;  Surgeon: Luanne Bras, MD;  Location: [facility];  Service: Radiology;  Laterality: N/A;  . TEE WITHOUT CARDIOVERSION N/A 09/30/2016   Procedure: TRANSESOPHAGEAL ECHOCARDIOGRAM (TEE);  Surgeon: Larey Dresser, MD;  Location: [facility] ENDOS[facility];  Service: Cardiovascular;  Laterality: N/A;  . TOTAL HIP ARTHROPLASTY Right 04/26/2016   Procedure: RIGHT TOTAL HIP ARTHROPLASTY ANTERIOR APPROACH;  Surgeon: Paralee Cancel, MD;  Location: [facility];  Service: Orthopedics;  Laterality: Right;  Marland Kitchen VASECTOMY    . WRIST SURGERY     "implant of plastic bone"-mild ROM limits    There were no vitals filed for this visit.      Subjective Assessment - 11/03/16 1341    Subjective "the sentences got harder"   Patient is accompained by: Family member   Currently in Pain? No/denies               ADULT SLP TREATMENT - 11/03/16 1341      General Information   Behavior/Cognition Alert;Cooperative;Pleasant mood     Treatment Provided   Treatment provided Cognitive-Linquistic     Pain Assessment   Pain Assessment No/denies pain     Cognitive-Linquistic Treatment   Treatment focused on Aphasia;Apraxia;Patient/family/caregiver education   Skilled Treatment Attempted cloze procedure to failitate automatic speech - despite max A pt did not comprehend the instructions for idiom completion. Pt filled in with the pledge and Lord's Prayer with 20% accracy and max A. Written phrases generated with a given word with max A for error awareness /correction. Pt continues to required visual, placement and verbal instructions for phoneme imitation and cv syllables. Consistent max A to combine vowels or consonant phoneme syllables. Reading comprehension of simple phrase with occasional min to mod A.      Assessment / Recommendations / Plan   Plan Continue with current plan of care     Progression Toward Goals   Progression toward goals Progressing toward goals  progessing with written expression,  speech with limited prog          SLP Education - 11/03/16 1413    Education provided Yes   Education Details encourage spontaneous social responses and fill in the blank with lyrics, idioms - written and spoken   Person(s) Educated Patient;Child(ren)  [name]   Methods Explanation;Demonstration;Verbal cues   Comprehension Verbalized understanding;Returned demonstration;Verbal cues required          SLP Short Term Goals - 11/03/16 1416       SLP SHORT TERM GOAL #1   Title Pt will answer simple yes/no questions accurately, either by head nod, pointing to the correct word, or verbalizing "yes" or "no"   Time 1   Period Weeks   Status Not Met     SLP SHORT TERM GOAL #2   Title Pt will follow 1-step verbal directions consistently, given mod (verbal/visual/tactile) cues.   Time 1   Period Weeks   Status Achieved     SLP SHORT TERM GOAL #3   Title Pt will imitate 3 sounds accurately given max visual and verbal cues   Status Achieved     SLP SHORT TERM GOAL #4   Title Pt will participate in further assessment of reading comprehension and written expression   Status Achieved     SLP SHORT TERM GOAL #5   Title Pt will demonstrate comprehension of short phrases with 80% accuracy, given mod cues   Baseline ID single word, fo2   Time 1   Period Weeks   Status Achieved          SLP Long Term Goals - 11/03/16 1416      SLP LONG TERM GOAL #1   Title Pt will answer complex yes/no questions accurately, either by head nod, pointing to the correct word, or verbalizing "yes" or "no"   Time 4   Period Weeks   Status On-going     SLP LONG TERM GOAL #2   Title Pt will follow 2-step verbal directions consistently, given min (verbal only) cues.   Time 4   Period Weeks   Status On-going     SLP LONG TERM GOAL #3   Title Pt will imitate 5 sounds accurately given max visual and verbal cues   Time 4   Period Weeks   Status Achieved     SLP LONG TERM GOAL #4   Title Pt will demonstrate comprehension of sentence length material with 80% accuracy   Time 4   Period Weeks   Status On-going     SLP LONG TERM GOAL #5   Title Pt will write short phrases with 80% accuracy   Time 4   Period Weeks   Status On-going     SLP LONG TERM GOAL #6   Title Pt/family will report 25% improvement in expressive communication with use of AAC/multimodal means of communication.   Time 4   Period Weeks   Status On-going          Plan  - 11/03/16 1414    Clinical Impression Statement Pt continues to make progress with written expression at simple phrase level with mod A, reading comprehension at phrase level with min A. Pt continues to imitate simple cv syllables, however not progressing to combining vowels or consonants in 1 utterance. Continue skilled ST to maximize communication.    Speech Therapy Frequency 3x / week   Treatment/Interventions Cueing hierarchy;SLP instruction and feedback;Language facilitation;Environmental controls;Functional tasks;Compensatory strategies;Multimodal communcation approach;Patient/family education;Compensatory techniques   Potential to Achieve  Goals Fair   Potential Considerations Ability to learn/carryover information;Severity of impairments      Patient will benefit from skilled therapeutic intervention in order to improve the following deficits and impairments:   Apraxia  Aphasia following other cerebrovascular disease    Problem List Patient Active Problem List   Diagnosis Date Noted  . Acute embolic stroke ([place])   . Cerebrovascular accident (CVA) due to thrombosis of precerebral artery ([place])   . Cerebrovascular accident (CVA) ([place]) 09/26/2016  . S/P right THA, AA 04/26/2016    Tameko Halder, Annye Rusk MS, CCC-SLP 11/03/2016, 2:18 PM  [facility] [address] [place], [place], [address] Phone: [phone]   Fax:  [phone]   Name: Johnny Sullivan MRN: [id number] Date of Birth: Sep 13, 1955

## 2016-11-06 LAB — CUP PACEART REMOTE DEVICE CHECK
Implantable Pulse Generator Implant Date: 20180525
MDC IDC SESS DTM: 20180624200556

## 2016-11-07 ENCOUNTER — Ambulatory Visit: Payer: BLUE CROSS/BLUE SHIELD | Attending: Neurology | Admitting: Speech Pathology

## 2016-11-07 DIAGNOSIS — I69820 Aphasia following other cerebrovascular disease: Secondary | ICD-10-CM | POA: Diagnosis present

## 2016-11-07 DIAGNOSIS — I639 Cerebral infarction, unspecified: Secondary | ICD-10-CM | POA: Insufficient documentation

## 2016-11-07 DIAGNOSIS — R482 Apraxia: Secondary | ICD-10-CM | POA: Insufficient documentation

## 2016-11-07 DIAGNOSIS — I69890 Apraxia following other cerebrovascular disease: Secondary | ICD-10-CM | POA: Insufficient documentation

## 2016-11-07 NOTE — Therapy (Signed)
[facility] [address] [place], [place], [address] Phone: [phone]   Fax:  [phone]  Speech Language Pathology Treatment  Patient Details  Name: Johnny Sullivan MRN: [id number] Date of Birth: 1956/01/01 Referring Provider: Dr. Rosalin Hawking  Encounter Date: 11/07/2016      End of Session - 11/07/16 1204    Visit Number 14   Number of Visits 17   Date for SLP Re-Evaluation 12/02/16   SLP Start Time 1105   SLP Stop Time  1148   SLP Time Calculation (min) 43 min   Activity Tolerance Patient tolerated treatment well      Past Medical History:  Diagnosis Date  . Arthritis    Osteoarthritis- right hip  . Coronary artery disease   . GERD (gastroesophageal reflux disease)   . Hypertension   . Myocardial infarction ([place])    " mild"-Dr. Debbora Lacrosse follows -gives clearance.  Marland Kitchen Urethral stricture    'uses self [place] caths weekly" to correct.    Past Surgical History:  Procedure Laterality Date  . BACK SURGERY     lumbar discectomy  . CARDIAC CATHETERIZATION    . CORONARY ARTERY BYPASS GRAFT     09-20-2012 x3 -[place]  . IR PERCUTANEOUS ART THROMBECTOMY/INFUSION INTRACRANIAL INC DIAG ANGIO  09/26/2016  . LOOP RECORDER INSERTION N/A 09/30/2016   Procedure: Loop Recorder Insertion;  Surgeon: Thompson Grayer, MD;  Location: [facility];  Service: Cardiovascular;  Laterality: N/A;  . RADIOLOGY WITH ANESTHESIA N/A 09/26/2016   Procedure: RADIOLOGY WITH ANESTHESIA;  Surgeon: Luanne Bras, MD;  Location: [facility];  Service: Radiology;  Laterality: N/A;  . TEE WITHOUT CARDIOVERSION N/A 09/30/2016   Procedure: TRANSESOPHAGEAL ECHOCARDIOGRAM (TEE);  Surgeon: Larey Dresser, MD;  Location: [facility] ENDOSCOPY;  Service: Cardiovascular;  Laterality: N/A;  . TOTAL HIP ARTHROPLASTY Right 04/26/2016   Procedure: RIGHT TOTAL HIP ARTHROPLASTY ANTERIOR APPROACH;  Surgeon: Paralee Cancel, MD;  [facility];  Service: Orthopedics;  Laterality: Right;  Marland Kitchen VASECTOMY    . WRIST SURGERY     "implant of plastic bone"-mild ROM limits    There were no vitals filed for this visit.      Subjective Assessment - 11/07/16 1155    Subjective "Mem - uh mem"   Patient is accompained by: Family member   Special Tests son Thurmond Butts               ADULT SLP TREATMENT - 11/07/16 1155      General Information   Behavior/Cognition Alert;Cooperative;Pleasant mood     Treatment Provided   Treatment provided Cognitive-Linquistic     Pain Assessment   Pain Assessment No/denies pain     Cognitive-Linquistic Treatment   Treatment focused on Aphasia;Apraxia;Patient/family/caregiver education   Skilled Treatment Facilitated verbal expression imitating vowel combinations with usual min to mod A.  Voiceless consonant with voice vowel combinations with max A.  Spontaneous speech approximations of  "I don't know" and "OK" 3x this session. Utilized Geologist, engineering for successful production of /l/ in isolation and /la/ with  consistent max A. Reading comprehension of mid frequency words f:6 85% accurate with extended time and rare min A. Written expression required frequent repetition of my questions for auditory comprehension. Pt wrore children's ages with usual mod verbal cues from his son.      Assessment / Recommendations / Plan   Plan Continue with current plan of care     Progression Toward Goals   Progression toward goals Progressing toward  goals          SLP Education - 11/07/16 1201    Education provided Yes   Education Details use open ended questions to facilitate written expression of 1-3 word phrases   Person(s) Educated Patient;Child(ren)   Methods Explanation;Demonstration   Comprehension Verbalized understanding          SLP Short Term Goals - 11/07/16 1203      SLP SHORT TERM GOAL #1   Title Pt will answer simple yes/no questions accurately, either by head nod, pointing to the correct  word, or verbalizing "yes" or "no"   Time 1   Period Weeks   Status Not Met     SLP SHORT TERM GOAL #2   Title Pt will follow 1-step verbal directions consistently, given mod (verbal/visual/tactile) cues.   Time 1   Period Weeks   Status Achieved     SLP SHORT TERM GOAL #3   Title Pt will imitate 3 sounds accurately given max visual and verbal cues   Status Achieved     SLP SHORT TERM GOAL #4   Title Pt will participate in further assessment of reading comprehension and written expression   Status Achieved     SLP SHORT TERM GOAL #5   Title Pt will demonstrate comprehension of short phrases with 80% accuracy, given mod cues   Baseline ID single word, fo2   Time 1   Period Weeks   Status Achieved          SLP Long Term Goals - 11/07/16 1204      SLP LONG TERM GOAL #1   Title Pt will answer complex yes/no questions accurately, either by head nod, pointing to the correct word, or verbalizing "yes" or "no"   Time 4   Period Weeks   Status On-going     SLP LONG TERM GOAL #2   Title Pt will follow 2-step verbal directions consistently, given min (verbal only) cues.   Time 4   Period Weeks   Status On-going     SLP LONG TERM GOAL #3   Title Pt will imitate 5 sounds accurately given max visual and verbal cues   Time 4   Period Weeks   Status Achieved     SLP LONG TERM GOAL #4   Title Pt will demonstrate comprehension of sentence length material with 80% accuracy   Time 4   Period Weeks   Status On-going     SLP LONG TERM GOAL #5   Title Pt will write short phrases with 80% accuracy   Time 4   Period Weeks   Status On-going     SLP LONG TERM GOAL #6   Title Pt/family will report 25% improvement in expressive communication with use of AAC/multimodal means of communication.   Time 4   Period Weeks   Status On-going          Plan - 11/07/16 1201    Clinical Impression Statement Pt progressing slowly with motor speech - he is combining 2 vowels and more  consistently producing /l/. He can produce prior target sounds with mod A. Continue to encourate written expession to augment verbal expression. Continue skilled ST to maximize mulitmodal communication.    Speech Therapy Frequency 3x / week   Treatment/Interventions Cueing hierarchy;SLP instruction and feedback;Language facilitation;Environmental controls;Functional tasks;Compensatory strategies;Multimodal communcation approach;Patient/family education;Compensatory techniques   Potential to Achieve Goals Fair   Potential Considerations Ability to learn/carryover information;Severity of impairments      Patient  will benefit from skilled therapeutic intervention in order to improve the following deficits and impairments:   Apraxia  Aphasia following other cerebrovascular disease    Problem List Patient Active Problem List   Diagnosis Date Noted  . Acute embolic stroke ([place])   . Cerebrovascular accident (CVA) due to thrombosis of precerebral artery ([place])   . Cerebrovascular accident (CVA) ([place]) 09/26/2016  . S/P right THA, AA 04/26/2016    [place], Annye Rusk MS, CCC-SLP 11/07/2016, 12:07 PM  [facility] [address] [place], [place], [address] Phone: [phone]   Fax:  [phone]   Name: Johnny Sullivan MRN: [id number] Date of Birth: 03-Dec-1955

## 2016-11-08 ENCOUNTER — Encounter: Payer: BLUE CROSS/BLUE SHIELD | Admitting: Occupational Therapy

## 2016-11-08 ENCOUNTER — Ambulatory Visit: Payer: BLUE CROSS/BLUE SHIELD | Admitting: *Deleted

## 2016-11-08 DIAGNOSIS — R482 Apraxia: Secondary | ICD-10-CM | POA: Diagnosis not present

## 2016-11-08 DIAGNOSIS — I639 Cerebral infarction, unspecified: Secondary | ICD-10-CM

## 2016-11-08 NOTE — Therapy (Signed)
[facility] [address] [place], [place], [address] Phone: [phone]   Fax:  [phone]  Speech Language Pathology Treatment  Patient Details  Name: Johnny Sullivan MRN: [id number] Date of Birth: August 14, 1955 Referring Provider: Dr. Rosalin Hawking  Encounter Date: 11/08/2016      End of Session - 11/08/16 1032    Visit Number 15   Number of Visits 17   Date for SLP Re-Evaluation 12/02/16   SLP Start Time 0935   SLP Stop Time  1020   SLP Time Calculation (min) 45 min   Activity Tolerance Patient tolerated treatment well      Past Medical History:  Diagnosis Date  . Arthritis    Osteoarthritis- right hip  . Coronary artery disease   . GERD (gastroesophageal reflux disease)   . Hypertension   . Myocardial infarction ([place])    " mild"-Dr. Debbora Lacrosse follows -gives clearance.  Marland Kitchen Urethral stricture    'uses self [place] caths weekly" to correct.    Past Surgical History:  Procedure Laterality Date  . BACK SURGERY     lumbar discectomy  . CARDIAC CATHETERIZATION    . CORONARY ARTERY BYPASS GRAFT     09-20-2012 x3 -[place]  . IR PERCUTANEOUS ART THROMBECTOMY/INFUSION INTRACRANIAL INC DIAG ANGIO  09/26/2016  . LOOP RECORDER INSERTION N/A 09/30/2016   Procedure: Loop Recorder Insertion;  Surgeon: Thompson Grayer, MD;  Location: [facility];  Service: Cardiovascular;  Laterality: N/A;  . RADIOLOGY WITH ANESTHESIA N/A 09/26/2016   Procedure: RADIOLOGY WITH ANESTHESIA;  Surgeon: Luanne Bras, MD;  Location: [facility];  Service: Radiology;  Laterality: N/A;  . TEE WITHOUT CARDIOVERSION N/A 09/30/2016   Procedure: TRANSESOPHAGEAL ECHOCARDIOGRAM (TEE);  Surgeon: Larey Dresser, MD;  Location: [facility] ENDOSCOPY;  Service: Cardiovascular;  Laterality: N/A;  . TOTAL HIP ARTHROPLASTY Right 04/26/2016   Procedure: RIGHT TOTAL HIP ARTHROPLASTY ANTERIOR APPROACH;  Surgeon: Paralee Cancel, MD;  Location: [facility]  ORS;  Service: Orthopedics;  Laterality: Right;  Marland Kitchen VASECTOMY    . WRIST SURGERY     "implant of plastic bone"-mild ROM limits    There were no vitals filed for this visit.      Subjective Assessment - 11/08/16 1024    Subjective My   Patient is accompained by: Family member   Special Tests son Thurmond Butts   Currently in Pain? No/denies               ADULT SLP TREATMENT - 11/08/16 0001      General Information   Behavior/Cognition Alert;Cooperative;Pleasant mood     Treatment Provided   Treatment provided Cognitive-Linquistic     Pain Assessment   Pain Assessment No/denies pain     Cognitive-Linquistic Treatment   Treatment focused on Aphasia;Apraxia;Patient/family/caregiver education   Skilled Treatment Skilled ST session targeted auditory and reading comprehension goals including answering yes/no questions: pt 60% accurate for "thumbs up/down" response to Level 1 & 2 questions. Son, Thurmond Butts, indicates pt tends to be impulsive with responses, and continues to be yes dominant. Reading comprehension: pt 75% accurate choosing the correct sentence to match a picture. Apraxia goals also addressed: pt able to approximate /m/, /pie/ and /bye/ inconsistently. Homeworkd was given for following written directions.      Assessment / Recommendations / Plan   Plan Continue with current plan of care     Progression Toward Goals   Progression toward goals Progressing toward goals          SLP Education -  11/08/16 1031    Education provided Yes   Education Details continue practice of bilabials and vowel /I/, following written directions, take breaks when pt/family frustrated or tired.   Person(s) Educated Patient;Child(ren)   Methods Demonstration;Explanation;Handout   Comprehension Verbalized understanding;Returned demonstration;Verbal cues required          SLP Short Term Goals - 11/08/16 1036      SLP SHORT TERM GOAL #1   Title Pt will answer simple yes/no questions  accurately, either by head nod, pointing to the correct word, or verbalizing "yes" or "no"   Time 1   Period Weeks   Status On-going     SLP SHORT TERM GOAL #2   Title Pt will follow 1-step verbal directions consistently, given mod (verbal/visual/tactile) cues.   Status Achieved     SLP SHORT TERM GOAL #3   Title Pt will imitate 3 sounds accurately given max visual and verbal cues   Status Achieved     SLP SHORT TERM GOAL #4   Title Pt will participate in further assessment of reading comprehension and written expression   Status Achieved     SLP SHORT TERM GOAL #5   Title Pt will demonstrate comprehension of short phrases with 80% accuracy, given mod cues   Status Achieved     SLP SHORT TERM GOAL #6   Title Pt will write single words with 95% accuracy, given min cues.   Status Partially Met     SLP SHORT TERM GOAL #7   Title Pt will demonstrate use of AAC/ multimodal communication to respond to simple questions with min-mod A.   Status Partially Met          SLP Long Term Goals - 11/08/16 1035      SLP LONG TERM GOAL #1   Title Pt will answer complex yes/no questions accurately, either by head nod, pointing to the correct word, or verbalizing "yes" or "no"   Time 4   Period Weeks   Status On-going     SLP LONG TERM GOAL #2   Title Pt will follow 2-step verbal directions consistently, given min (verbal only) cues.     SLP LONG TERM GOAL #3   Title Pt will imitate 5 sounds accurately given max visual and verbal cues   Status Achieved     SLP LONG TERM GOAL #4   Title Pt will demonstrate comprehension of sentence length material with 80% accuracy   Time 4   Period Weeks   Status On-going     SLP LONG TERM GOAL #5   Title Pt will write short phrases with 80% accuracy   Time 4   Period Weeks   Status On-going     SLP LONG TERM GOAL #6   Title Pt/family will report 25% improvement in expressive communication with use of AAC/multimodal means of communication.    Time 4   Period Weeks   Status On-going          Plan - 11/08/16 1032    Clinical Impression Statement Pt has made progress since last seen by this SLP, with the addition of more vowel and consonant sounds. Pt/family continue to be highly motivated to improve and are carrying over strategies, techniques, and exercises at home. Continued skilled ST intervention is recommended to maximize effective multimodal communication and reduce caregiver burden.   Speech Therapy Frequency 3x / week   Duration --  8 weeks   Treatment/Interventions Cueing hierarchy;SLP instruction and feedback;Language facilitation;Environmental controls;Functional tasks;Compensatory  strategies;Multimodal communcation approach;Patient/family education;Compensatory techniques   Potential to Achieve Goals Fair   Potential Considerations Ability to learn/carryover information;Severity of impairments;Cooperation/participation level;Family/community support;Previous level of function   SLP Home Exercise Plan reviewed   Consulted and Agree with Plan of Care Patient;Family member/caregiver   Family Member Consulted son Thurmond Butts      Patient will benefit from skilled therapeutic intervention in order to improve the following deficits and impairments:   Acute embolic stroke [facility])    Problem List Patient Active Problem List   Diagnosis Date Noted  . Acute embolic stroke ([place])   . Cerebrovascular accident (CVA) due to thrombosis of precerebral artery ([place])   . Cerebrovascular accident (CVA) ([place]) 09/26/2016  . S/P right THA, AA 04/26/2016   Celia B. [place], [place], CCC-SLP  Shonna Chock 11/08/2016, 10:38 AM  [facility] [address] [place], [place], [address] Phone: [phone]   Fax:  [phone]   Name: Lynell Kussman MRN: [id number] Date of Birth: 11/24/1955

## 2016-11-10 ENCOUNTER — Ambulatory Visit: Payer: BLUE CROSS/BLUE SHIELD | Admitting: Speech Pathology

## 2016-11-10 ENCOUNTER — Encounter: Payer: BLUE CROSS/BLUE SHIELD | Admitting: Occupational Therapy

## 2016-11-10 DIAGNOSIS — I69820 Aphasia following other cerebrovascular disease: Secondary | ICD-10-CM

## 2016-11-10 DIAGNOSIS — R482 Apraxia: Secondary | ICD-10-CM | POA: Diagnosis not present

## 2016-11-10 DIAGNOSIS — I69890 Apraxia following other cerebrovascular disease: Secondary | ICD-10-CM

## 2016-11-10 NOTE — Therapy (Signed)
[facility] [address] [place], [place], [address] Phone: [phone]   Fax:  [phone]  Speech Language Pathology Treatment  Patient Details  Name: Johnny Sullivan MRN: [id number] Date of Birth: 1955-11-07 Referring Provider: Dr. Rosalin Hawking  Encounter Date: 11/10/2016      End of Session - 11/10/16 1421    Visit Number 15   Number of Visits 23   Date for SLP Re-Evaluation 12/02/16   SLP Start Time 7989   SLP Stop Time  1405   SLP Time Calculation (min) 48 min   Activity Tolerance Patient tolerated treatment well      Past Medical History:  Diagnosis Date  . Arthritis    Osteoarthritis- right hip  . Coronary artery disease   . GERD (gastroesophageal reflux disease)   . Hypertension   . Myocardial infarction ([place])    " mild"-Dr. Debbora Lacrosse follows -gives clearance.  Marland Kitchen Urethral stricture    'uses self [place] caths weekly" to correct.    Past Surgical History:  Procedure Laterality Date  . BACK SURGERY     lumbar discectomy  . CARDIAC CATHETERIZATION    . CORONARY ARTERY BYPASS GRAFT     09-20-2012 x3 -[place]  . IR PERCUTANEOUS ART THROMBECTOMY/INFUSION INTRACRANIAL INC DIAG ANGIO  09/26/2016  . LOOP RECORDER INSERTION N/A 09/30/2016   Procedure: Loop Recorder Insertion;  Surgeon: Thompson Grayer, MD;  Location: [facility];  Service: Cardiovascular;  Laterality: N/A;  . RADIOLOGY WITH ANESTHESIA N/A 09/26/2016   Procedure: RADIOLOGY WITH ANESTHESIA;  Surgeon: Luanne Bras, MD;  Location: [facility];  Service: Radiology;  Laterality: N/A;  . TEE WITHOUT CARDIOVERSION N/A 09/30/2016   Procedure: TRANSESOPHAGEAL ECHOCARDIOGRAM (TEE);  Surgeon: Larey Dresser, MD;  [facility] ENDOSCOPY;  Service: Cardiovascular;  Laterality: N/A;  . TOTAL HIP ARTHROPLASTY Right 04/26/2016   Procedure: RIGHT TOTAL HIP ARTHROPLASTY ANTERIOR APPROACH;  Surgeon: Paralee Cancel, MD;  Location: [facility];  Service: Orthopedics;  Laterality: Right;  Marland Kitchen VASECTOMY    . WRIST SURGERY     "implant of plastic bone"-mild ROM limits    There were no vitals filed for this visit.             ADULT SLP TREATMENT - 11/10/16 1405      General Information   Behavior/Cognition Alert;Cooperative;Pleasant mood     Treatment Provided   Treatment provided Cognitive-Linquistic     Pain Assessment   Pain Assessment No/denies pain     Cognitive-Linquistic Treatment   Treatment focused on Aphasia;Apraxia;Patient/family/caregiver education   Skilled Treatment Reading comprehension sentence level homework with difficulty - pt id'd correct word f:4 with 60% accuracy, however he did not follow directions to cross out, circle, box etc. Reading comprehension facilitated in therapy with mid frequency word level with occasional min A, Written expression to answer concrete questions with 1-2 words with usual mod A for  wife and daughter's profression and other activities plannied today. Pt required max A to comprehend questions re: his dog's breed. Motor speech facilitated imitating cv syllables with targeted sounds with initial placement and sound cues, fading to rare min A. Attempt of vc syllables and using voiceless consonant with vowel with max A, 50% success.      Assessment / Recommendations / Plan   Plan Continue with current plan of care     Progression Toward Goals   Progression toward goals Progressing toward goals  with reading/writing. Motor speech not progressing  SLP Short Term Goals - 11/10/16 1417      SLP SHORT TERM GOAL #1   Title Pt will answer simple yes/no questions accurately, either by head nod, pointing to the correct word, or verbalizing "yes" or "no"   Time 1   Period Weeks   Status On-going     SLP SHORT TERM GOAL #2   Title Pt will follow 1-step verbal directions consistently, given mod (verbal/visual/tactile) cues.   Status Achieved     SLP SHORT  TERM GOAL #3   Title Pt will imitate 3 sounds accurately given max visual and verbal cues   Status Achieved     SLP SHORT TERM GOAL #4   Title Pt will participate in further assessment of reading comprehension and written expression   Status Achieved     SLP SHORT TERM GOAL #5   Title Pt will demonstrate comprehension of short phrases with 80% accuracy, given mod cues   Status Achieved     SLP SHORT TERM GOAL #6   Title Pt will write single words with 95% accuracy, given min cues.   Status Partially Met     SLP SHORT TERM GOAL #7   Title Pt will demonstrate use of AAC/ multimodal communication to respond to simple questions with min-mod A.   Status Partially Met          SLP Long Term Goals - 11/10/16 1419      SLP LONG TERM GOAL #1   Title Pt will answer complex yes/no questions accurately, either by head nod, pointing to the correct word, or verbalizing "yes" or "no"   Time 4   Period Weeks   Status On-going     SLP LONG TERM GOAL #2   Title Pt will follow 2-step verbal directions consistently, given min (verbal only) cues.   Time 4   Period Weeks   Status On-going     SLP LONG TERM GOAL #3   Title Pt will imitate 5 sounds accurately given max visual and verbal cues   Baseline f,v,p,b,sh,th,l,m,n   Time 4   Period Weeks   Status Achieved     SLP LONG TERM GOAL #4   Title Pt will demonstrate comprehension of sentence length material with 80% accuracy   Time 4   Status On-going     SLP LONG TERM GOAL #5   Title Pt will write short phrases with 80% accuracy   Time 4   Period Weeks   Status On-going     SLP LONG TERM GOAL #6   Title Pt/family will report 25% improvement in expressive communication with use of AAC/multimodal means of communication.   Time 4   Period Weeks   Status On-going          Plan - 11/10/16 1422    Speech Therapy Frequency 3x / week   Duration --  8 weeks or total of 23 visits      Patient will benefit from skilled  therapeutic intervention in order to improve the following deficits and impairments:   Aphasia following other cerebrovascular disease  Apraxia following other cerebrovascular disease    Problem List Patient Active Problem List   Diagnosis Date Noted  . Acute embolic stroke ([place])   . Cerebrovascular accident (CVA) due to thrombosis of precerebral artery ([place])   . Cerebrovascular accident (CVA) ([place]) 09/26/2016  . S/P right THA, AA 04/26/2016    Sanskriti Greenlaw, Annye Rusk  MS, CCC-SLP 11/10/2016, 2:22 PM  [facility] [address] [place], [place], [address] Phone: [phone]   Fax:  [phone]   Name: Johnny Sullivan MRN: [id number] Date of Birth: 1955-06-25

## 2016-11-15 ENCOUNTER — Encounter: Payer: BLUE CROSS/BLUE SHIELD | Admitting: Occupational Therapy

## 2016-11-15 ENCOUNTER — Ambulatory Visit: Payer: BLUE CROSS/BLUE SHIELD | Admitting: *Deleted

## 2016-11-15 DIAGNOSIS — R482 Apraxia: Secondary | ICD-10-CM

## 2016-11-15 DIAGNOSIS — I69820 Aphasia following other cerebrovascular disease: Secondary | ICD-10-CM

## 2016-11-15 NOTE — Therapy (Signed)
[facility] [address] [place], [place], [address] Phone: [phone]   Fax:  [phone]  Speech Language Pathology Treatment  Patient Details  Name: Johnny Sullivan MRN: [id number] Date of Birth: [birth year] Referring Provider: Dr. Rosalin Hawking  Encounter Date: 11/15/2016      End of Session - 11/15/16 1209    Visit Number 16   Number of Visits 23   Date for SLP Re-Evaluation 12/02/16   SLP Start Time 0930   SLP Stop Time  3557   SLP Time Calculation (min) 45 min   Activity Tolerance Patient tolerated treatment well      Past Medical History:  Diagnosis Date  . Arthritis    Osteoarthritis- right hip  . Coronary artery disease   . GERD (gastroesophageal reflux disease)   . Hypertension   . Myocardial infarction ([place])    " mild"-Dr. Debbora Lacrosse follows -gives clearance.  Marland Kitchen Urethral stricture    'uses self [place] caths weekly" to correct.    Past Surgical History:  Procedure Laterality Date  . BACK SURGERY     lumbar discectomy  . CARDIAC CATHETERIZATION    . CORONARY ARTERY BYPASS GRAFT     09-20-2012 x3 -[place]  . IR PERCUTANEOUS ART THROMBECTOMY/INFUSION INTRACRANIAL INC DIAG ANGIO  09/26/2016  . LOOP RECORDER INSERTION N/A 09/30/2016   Procedure: Loop Recorder Insertion;  Surgeon: Thompson Grayer, MD;  Location: [facility] CV LAB;  Service: Cardiovascular;  Laterality: N/A;  . RADIOLOGY WITH ANESTHESIA N/A 09/26/2016   Procedure: RADIOLOGY WITH ANESTHESIA;  Surgeon: Luanne Bras, MD;  Location: [facility];  Service: Radiology;  Laterality: N/A;  . TEE WITHOUT CARDIOVERSION N/A 09/30/2016   Procedure: TRANSESOPHAGEAL ECHOCARDIOGRAM (TEE);  Surgeon: Larey Dresser, MD;  Location: [facility] ENDOSCOPY;  Service: Cardiovascular;  Laterality: N/A;  . TOTAL HIP ARTHROPLASTY Right 04/26/2016   Procedure: RIGHT TOTAL HIP ARTHROPLASTY ANTERIOR APPROACH;  Surgeon: Paralee Cancel, MD;  [facility];  Service: Orthopedics;  Laterality: Right;  Marland Kitchen VASECTOMY    . WRIST SURGERY     "implant of plastic bone"-mild ROM limits    There were no vitals filed for this visit.      Subjective Assessment - 11/15/16 1202    Subjective "I don't know"   Patient is accompained by: Family member  sister Suanne Marker   Currently in Pain? No/denies               ADULT SLP TREATMENT - 11/15/16 0001      General Information   Behavior/Cognition Alert;Cooperative;Pleasant mood     Treatment Provided   Treatment provided Cognitive-Linquistic     Pain Assessment   Pain Assessment No/denies pain     Cognitive-Linquistic Treatment   Treatment focused on Aphasia;Apraxia;Patient/family/caregiver education   Skilled Treatment ST session focused on using written expression to facilitate functional and effective communication. Pt able to write key words to narrow choices when trying to communicate that both pt's sister and I had been at the beach for 2 weeks. Pt able to write activities he enjoys at [employer] (fishing, restaurants, etc), and was able to identify basic meal plan for his week at the beach coming up., including designation of who was responsible for dinner that night Santiago Glad, [place], [place]). Pt was encouraged to continiue to use written expression to express wants/needs, keeping it simple and limiting impulsivity.      Assessment / Recommendations / Plan   Plan Continue with current plan of care  Progression Toward Goals   Progression toward goals Progressing toward goals          SLP Education - 11/15/16 1208    Education provided Yes   Education Details use written expression to facilitate effective communication   Person(s) Educated Patient  sister   Methods Explanation;Demonstration;Handout;Verbal cues   Comprehension Verbalized understanding;Returned demonstration;Verbal cues required          SLP Short Term Goals - 11/15/16 1211      SLP SHORT TERM  GOAL #1   Title Pt will answer simple yes/no questions accurately, either by head nod, pointing to the correct word, or verbalizing "yes" or "no"   Status Partially Met     SLP SHORT TERM GOAL #2   Title Pt will follow 1-step verbal directions consistently, given mod (verbal/visual/tactile) cues.   Status Achieved     SLP SHORT TERM GOAL #3   Title Pt will imitate 3 sounds accurately given max visual and verbal cues   Status Achieved     SLP SHORT TERM GOAL #4   Title Pt will participate in further assessment of reading comprehension and written expression   Status Achieved     SLP SHORT TERM GOAL #5   Title Pt will demonstrate comprehension of short phrases with 80% accuracy, given mod cues   Status Achieved     SLP SHORT TERM GOAL #6   Title Pt will write single words with 95% accuracy, given min cues.   Status Achieved     SLP SHORT TERM GOAL #7   Title Pt will demonstrate use of AAC/ multimodal communication to respond to simple questions with min-mod A.   Status Partially Met          SLP Long Term Goals - 11/15/16 1212      SLP LONG TERM GOAL #1   Title Pt will answer complex yes/no questions accurately, either by head nod, pointing to the correct word, or verbalizing "yes" or "no"   Time 3   Period Weeks   Status On-going     SLP LONG TERM GOAL #2   Title Pt will follow 2-step verbal directions consistently, given min (verbal only) cues.   Time 3   Period Weeks   Status On-going     SLP LONG TERM GOAL #3   Title Pt will imitate 5 sounds accurately given max visual and verbal cues   Status Achieved     SLP LONG TERM GOAL #4   Title Pt will demonstrate comprehension of sentence length material with 80% accuracy   Time 3   Period Weeks   Status On-going     SLP LONG TERM GOAL #5   Title Pt will write short phrases with 80% accuracy   Time 3   Period Weeks   Status On-going     SLP LONG TERM GOAL #6   Title Pt/family will report 25% improvement in  expressive communication with use of AAC/multimodal means of communication.   Time 3   Period Weeks   Status On-going          Plan - 11/15/16 1209    Clinical Impression Statement Pt sister in attendance this session, and reports she has noticed improvement in pt speech, with increase in spontaneous comments recently. Pt demonstrated ability to write single words to answer questions and complete list making activity. Continued ST intervention is recommended to facilitate functional and effective communication. No mention of taking a break from therapy today.  Speech Therapy Frequency 3x / week   Duration --  8 weeks/23 visits   Treatment/Interventions Cueing hierarchy;SLP instruction and feedback;Language facilitation;Environmental controls;Functional tasks;Compensatory strategies;Multimodal communcation approach;Patient/family education;Compensatory techniques   Potential Considerations Ability to learn/carryover information;Severity of impairments;Cooperation/participation level;Family/community support;Previous level of function   SLP Home Exercise Plan reviewed   Consulted and Agree with Plan of Care Patient;Family member/caregiver   Family Member Consulted sister Suanne Marker      Patient will benefit from skilled therapeutic intervention in order to improve the following deficits and impairments:   Apraxia  Aphasia following other cerebrovascular disease    Problem List Patient Active Problem List   Diagnosis Date Noted  . Acute embolic stroke ([place])   . Cerebrovascular accident (CVA) due to thrombosis of precerebral artery ([place])   . Cerebrovascular accident (CVA) ([place]) 09/26/2016  . S/P right THA, AA 04/26/2016   Cathyrn Deas B. [place], MSP, CCC-SLP  Shonna Chock 11/15/2016, 12:13 PM  [facility] [address] [place], [place], [address] Phone: [phone]   Fax:  [phone]   Name: Gavon Majano MRN:  [id number] Date of Birth: [birth year]

## 2016-11-17 ENCOUNTER — Encounter: Payer: BLUE CROSS/BLUE SHIELD | Admitting: Occupational Therapy

## 2016-11-17 ENCOUNTER — Ambulatory Visit: Payer: BLUE CROSS/BLUE SHIELD | Admitting: Speech Pathology

## 2016-11-17 DIAGNOSIS — R482 Apraxia: Secondary | ICD-10-CM

## 2016-11-17 DIAGNOSIS — I69820 Aphasia following other cerebrovascular disease: Secondary | ICD-10-CM

## 2016-11-17 NOTE — Therapy (Signed)
[facility] [address] [place], [place], [address] Phone: [phone]   Fax:  [phone]  Speech Language Pathology Treatment  Patient Details  Name: Johnny Sullivan MRN: [id number] Date of Birth: 1955/07/05 Referring Provider: Dr. Rosalin Hawking  Encounter Date: 11/17/2016      End of Session - 11/17/16 1245    Visit Number 17   Number of Visits 23   Date for SLP Re-Evaluation 12/02/16   SLP Start Time 0930   SLP Stop Time  3536   SLP Time Calculation (min) 45 min   Activity Tolerance Patient tolerated treatment well      Past Medical History:  Diagnosis Date  . Arthritis    Osteoarthritis- right hip  . Coronary artery disease   . GERD (gastroesophageal reflux disease)   . Hypertension   . Myocardial infarction ([place])    " mild"-Dr. Debbora Lacrosse follows -gives clearance.  Marland Kitchen Urethral stricture    'uses self [place] caths weekly" to correct.    Past Surgical History:  Procedure Laterality Date  . BACK SURGERY     lumbar discectomy  . CARDIAC CATHETERIZATION    . CORONARY ARTERY BYPASS GRAFT     09-20-2012 x3 [place]  . IR PERCUTANEOUS ART THROMBECTOMY/INFUSION INTRACRANIAL INC DIAG ANGIO  09/26/2016  . LOOP RECORDER INSERTION N/A 09/30/2016   Procedure: Loop Recorder Insertion;  Surgeon: Thompson Grayer, MD;  Location: [facility];  Service: Cardiovascular;  Laterality: N/A;  . RADIOLOGY WITH ANESTHESIA N/A 09/26/2016   Procedure: RADIOLOGY WITH ANESTHESIA;  Surgeon: Luanne Bras, MD;  Location: [facility];  Service: Radiology;  Laterality: N/A;  . TEE WITHOUT CARDIOVERSION N/A 09/30/2016   Procedure: TRANSESOPHAGEAL ECHOCARDIOGRAM (TEE);  Surgeon: Larey Dresser, MD;  [facility]: [facility] END[facility];  Service: Cardiovascular;  Laterality: N/A;  . TOTAL HIP ARTHROPLASTY Right 04/26/2016   Procedure: RIGHT TOTAL HIP ARTHROPLASTY ANTERIOR APPROACH;  Surgeon: Paralee Cancel, MD;  [facility]: [facility];  Service: Orthopedics;  Laterality: Right;  Marland Kitchen VASECTOMY    . WRIST SURGERY     "implant of plastic bone"-mild ROM limits    There were no vitals filed for this visit.      Subjective Assessment - 11/17/16 0933    Subjective "Okay"   Patient is accompained by: Family member   Special Tests sister Suanne Marker   Currently in Pain? No/denies               ADULT SLP TREATMENT - 11/17/16 0933      General Information   Behavior/Cognition Alert;Cooperative;Pleasant mood     Treatment Provided   Treatment provided Cognitive-Linquistic     Pain Assessment   Pain Assessment No/denies pain     Cognitive-Linquistic Treatment   Treatment focused on Aphasia;Apraxia;Patient/family/caregiver education   Skilled Treatment SLP targeted writing with word-level tasks this session. Sister questioning whether having pt copy sentences would improve writing. SLP provided education re: writing as a component of language and provided instruction in writing exercises to complete using [employer] application, encouraging self-formulation vs copying words. Word-level copy and spelling completion with 100% accuracy, level 1 word spelling with 80% accuracy and consistent mod cues. Sister reported pt frustration with lengthy email he received. SLP educated re: deficits in reading comprehension and provided training in accessiblity features (text to speech) to improve comprehension.     Assessment / Recommendations / Plan   Plan Continue with current plan of care     Progression Toward Goals   Progression toward  goals Progressing toward goals          SLP Education - 11/17/16 1246    Education provided Yes   Education Details text to speech, accessibility features on smart phone to improve comprehension   Person(s) Educated Patient;Other (comment)  sister Suanne Marker   Methods Explanation;Demonstration;Verbal cues   Comprehension Verbalized understanding;Returned demonstration           SLP Short Term Goals - 11/17/16 1057      SLP SHORT TERM GOAL #1   Title Pt will answer simple yes/no questions accurately, either by head nod, pointing to the correct word, or verbalizing "yes" or "no"   Status Partially Met     SLP SHORT TERM GOAL #2   Title Pt will follow 1-step verbal directions consistently, given mod (verbal/visual/tactile) cues.   Status Achieved     SLP SHORT TERM GOAL #3   Title Pt will imitate 3 sounds accurately given max visual and verbal cues   Status Achieved     SLP SHORT TERM GOAL #4   Title Pt will participate in further assessment of reading comprehension and written expression   Status Achieved     SLP SHORT TERM GOAL #5   Title Pt will demonstrate comprehension of short phrases with 80% accuracy, given mod cues   Status Achieved     SLP SHORT TERM GOAL #6   Title Pt will write single words with 95% accuracy, given min cues.   Status Achieved     SLP SHORT TERM GOAL #7   Title Pt will demonstrate use of AAC/ multimodal communication to respond to simple questions with min-mod A.   Status Partially Met          SLP Long Term Goals - 11/17/16 1057      SLP LONG TERM GOAL #1   Title (P)  Pt will answer complex yes/no questions accurately, either by head nod, pointing to the correct word, or verbalizing "yes" or "no"   Time (P)  3   Period (P)  Weeks   Status (P)  On-going     SLP LONG TERM GOAL #2   Title (P)  Pt will follow 2-step verbal directions consistently, given min (verbal only) cues.   Time (P)  3   Period (P)  Weeks   Status (P)  On-going     SLP LONG TERM GOAL #3   Title (P)  Pt will imitate 5 sounds accurately given max visual and verbal cues   Status (P)  Achieved     SLP LONG TERM GOAL #4   Title (P)  Pt will demonstrate comprehension of sentence length material with 80% accuracy   Time (P)  3   Period (P)  Weeks   Status (P)  On-going     SLP LONG TERM GOAL #5   Title (P)  Pt will write short phrases with 80%  accuracy   Time (P)  3   Period (P)  Weeks   Status (P)  On-going     SLP LONG TERM GOAL #6   Title (P)  Pt/family will report 25% improvement in expressive communication with use of AAC/multimodal means of communication.   Time (P)  3   Period (P)  Weeks   Status (P)  On-going          Plan - 11/17/16 1246    Clinical Impression Statement Pt sister in attendance this session, and reports she has noticed improvement in pt speech, with increase  in spontaneous comments recently. Pt demonstrated ability to write single words to answer questions and complete list making activity. Continued ST intervention is recommended to facilitate functional and effective communication. No mention of taking a break from therapy today.    Speech Therapy Frequency 3x / week   Treatment/Interventions Cueing hierarchy;SLP instruction and feedback;Language facilitation;Environmental controls;Functional tasks;Compensatory strategies;Multimodal communcation approach;Patient/family education;Compensatory techniques   Potential to Achieve Goals Fair   Potential Considerations Ability to learn/carryover information;Severity of impairments;Cooperation/participation level;Family/community support;Previous level of function   SLP Home Exercise Plan reviewed   Consulted and Agree with Plan of Care Patient;Family member/caregiver   Family Member Consulted sister Suanne Marker      Patient will benefit from skilled therapeutic intervention in order to improve the following deficits and impairments:   Aphasia following other cerebrovascular disease  Apraxia    Problem List Patient Active Problem List   Diagnosis Date Noted  . Acute embolic stroke ([place])   . Cerebrovascular accident (CVA) due to thrombosis of precerebral artery ([place])   . Cerebrovascular accident (CVA) ([place]) 09/26/2016  . S/P right THA, AA 04/26/2016   Deneise Lever, [place], CCC-SLP Speech-Language Pathologist   Aliene Altes 11/17/2016, 1:09  PM  [facility] [address] [place], [place], [address] Phone: [phone]   Fax:  [phone]   Name: Fabiano Ginley MRN: [id number] Date of Birth: [birth year]

## 2016-11-18 ENCOUNTER — Ambulatory Visit: Payer: BLUE CROSS/BLUE SHIELD | Admitting: *Deleted

## 2016-11-18 DIAGNOSIS — I69820 Aphasia following other cerebrovascular disease: Secondary | ICD-10-CM

## 2016-11-18 DIAGNOSIS — R482 Apraxia: Secondary | ICD-10-CM

## 2016-11-18 NOTE — Therapy (Signed)
[facility] [address] [place], [place], [address] Phone: [phone]   Fax:  [phone]  Speech Language Pathology Treatment  Patient Details  Name: Johnny Sullivan MRN: [id number] Date of Birth: 16-Apr-1956 Referring Provider: Dr. Rosalin Hawking  Encounter Date: 11/18/2016      End of Session - 11/18/16 1250    Visit Number 19   Number of Visits 23   Date for SLP Re-Evaluation 12/02/16   SLP Start Time 70   SLP Stop Time  1100   SLP Time Calculation (min) 40 min   Activity Tolerance Patient tolerated treatment well      Past Medical History:  Diagnosis Date  . Arthritis    Osteoarthritis- right hip  . Coronary artery disease   . GERD (gastroesophageal reflux disease)   . Hypertension   . Myocardial infarction ([place])    " mild"-Dr. Debbora Lacrosse follows -gives clearance.  Marland Kitchen Urethral stricture    'uses self [place] caths weekly" to correct.    Past Surgical History:  Procedure Laterality Date  . BACK SURGERY     lumbar discectomy  . CARDIAC CATHETERIZATION    . CORONARY ARTERY BYPASS GRAFT     09-20-2012 x3 -[place]  . IR PERCUTANEOUS ART THROMBECTOMY/INFUSION INTRACRANIAL INC DIAG ANGIO  09/26/2016  . LOOP RECORDER INSERTION N/A 09/30/2016   Procedure: Loop Recorder Insertion;  Surgeon: Thompson Grayer, MD;  Location: [facility];  Service: Cardiovascular;  Laterality: N/A;  . RADIOLOGY WITH ANESTHESIA N/A 09/26/2016   Procedure: RADIOLOGY WITH ANESTHESIA;  Surgeon: Luanne Bras, MD;  Location: [facility];  Service: Radiology;  Laterality: N/A;  . TEE WITHOUT CARDIOVERSION N/A 09/30/2016   Procedure: TRANSESOPHAGEAL ECHOCARDIOGRAM (TEE);  Surgeon: Larey Dresser, MD;  Location: [facility] ENDOSCOPY;  Service: Cardiovascular;  Laterality: N/A;  . TOTAL HIP ARTHROPLASTY Right 04/26/2016   Procedure: RIGHT TOTAL HIP ARTHROPLASTY ANTERIOR APPROACH;  Surgeon: Paralee Cancel, MD;  [facility];  Service: Orthopedics;  Laterality: Right;  Marland Kitchen VASECTOMY    . WRIST SURGERY     "implant of plastic bone"-mild ROM limits    There were no vitals filed for this visit.      Subjective Assessment - 11/18/16 1248    Subjective Pt has very expressive facial expressions   Patient is accompained by: Family member  [name]   Currently in Pain? No/denies               ADULT SLP TREATMENT - 11/18/16 0001      General Information   Behavior/Cognition Alert;Cooperative;Pleasant mood     Treatment Provided   Treatment provided Cognitive-Linquistic     Pain Assessment   Pain Assessment No/denies pain     Cognitive-Linquistic Treatment   Treatment focused on Aphasia;Apraxia;Patient/family/caregiver education   Skilled Treatment Skilled ST session focused on completion of ( Reading Comprehension Battery for Aphasia RCBA). Word-Visual = 90%, functional reading = 50%, sentence-picture = 80%, paragraph-picture = 60%, paragraph factual = 90%, paragraph inferential = 70%, morpho-syntax = 20%.      Assessment / Recommendations / Plan   Plan Continue with current plan of care     Progression Toward Goals   Progression toward goals Progressing toward goals          SLP Education - 11/18/16 1054    Education provided Yes   Education Details continue working on [employer], continue using text to Publishing copy) Educated Patient;Child(ren)   Methods Explanation;Demonstration;Verbal cues   Comprehension  Verbalized understanding;Returned demonstration          SLP Short Term Goals - 11/18/16 1256      SLP SHORT TERM GOAL #1   Title Pt will answer simple yes/no questions accurately, either by head nod, pointing to the correct word, or verbalizing "yes" or "no"   Status Partially Met     SLP SHORT TERM GOAL #2   Title Pt will follow 1-step verbal directions consistently, given mod (verbal/visual/tactile) cues.   Status Achieved     SLP SHORT TERM GOAL #3   Title Pt  will imitate 3 sounds accurately given max visual and verbal cues   Status Achieved     SLP SHORT TERM GOAL #4   Title Pt will participate in further assessment of reading comprehension and written expression   Status Achieved     SLP SHORT TERM GOAL #5   Title Pt will demonstrate comprehension of short phrases with 80% accuracy, given mod cues   Status Achieved     SLP SHORT TERM GOAL #6   Title Pt will write single words with 95% accuracy, given min cues.   Status Achieved     SLP SHORT TERM GOAL #7   Title Pt will demonstrate use of AAC/ multimodal communication to respond to simple questions with min-mod A.   Status Partially Met          SLP Long Term Goals - 11/18/16 1256      SLP LONG TERM GOAL #1   Title Pt will answer complex yes/no questions accurately, either by head nod, pointing to the correct word, or verbalizing "yes" or "no"   Time 3   Period Weeks   Status On-going     SLP LONG TERM GOAL #2   Title Pt will follow 2-step verbal directions consistently, given min (verbal only) cues.   Time 3   Period Weeks   Status On-going     SLP LONG TERM GOAL #3   Title Pt will imitate 5 sounds accurately given max visual and verbal cues   Status Achieved     SLP LONG TERM GOAL #4   Title Pt will demonstrate comprehension of sentence length material with 80% accuracy   Time 3   Period Weeks   Status On-going     SLP LONG TERM GOAL #5   Title Pt will write short phrases with 80% accuracy   Time 3   Period Weeks   Status On-going     SLP LONG TERM GOAL #6   Title Pt/family will report 25% improvement in expressive communication with use of AAC/multimodal means of communication.   Time 3   Period Weeks   Status On-going          Plan - 11/18/16 1251    Clinical Impression Statement Portions of the Reading comprehension Battery for Aphasia was completed. Pt/son were encouraged to continue working on text to speech feature, and writing out menu items for  the beach. Continued ST intervention is recommended to maximize functional and effective communication.    Speech Therapy Frequency 3x / week   Duration --  8 weeks   Treatment/Interventions Cueing hierarchy;SLP instruction and feedback;Language facilitation;Environmental controls;Functional tasks;Compensatory strategies;Multimodal communcation approach;Patient/family education;Compensatory techniques   Potential to Achieve Goals Fair   Potential Considerations Ability to learn/carryover information;Severity of impairments;Cooperation/participation level;Family/community support;Previous level of function   SLP Home Exercise Plan reviewed   Consulted and Agree with Plan of Care Patient;Family member/caregiver   Family Member Consulted  son Thurmond Butts      Patient will benefit from skilled therapeutic intervention in order to improve the following deficits and impairments:   Aphasia following other cerebrovascular disease  Apraxia    Problem List Patient Active Problem List   Diagnosis Date Noted  . Acute embolic stroke ([place])   . Cerebrovascular accident (CVA) due to thrombosis of precerebral artery ([place])   . Cerebrovascular accident (CVA) ([place]) 09/26/2016  . S/P right THA, AA 04/26/2016   Bradlee Bridgers B. [place], MSP, CCC-SLP  Shonna Chock 11/18/2016, 12:57 PM  [facility] [address] [place], [place], [address] Phone: [phone]   Fax:  [phone]   Name: Wyett Narine MRN: [id number] Date of Birth: 13-Apr-1956

## 2016-11-18 NOTE — Patient Instructions (Signed)
  Continue working on [employer]

## 2016-11-22 ENCOUNTER — Ambulatory Visit: Payer: BLUE CROSS/BLUE SHIELD | Admitting: Speech Pathology

## 2016-11-22 DIAGNOSIS — R482 Apraxia: Secondary | ICD-10-CM | POA: Diagnosis not present

## 2016-11-22 DIAGNOSIS — I69820 Aphasia following other cerebrovascular disease: Secondary | ICD-10-CM

## 2016-11-22 NOTE — Therapy (Signed)
[facility] [address] [place], [place], [address] Phone: [phone]   Fax:  [phone]  Speech Language Pathology Treatment  Patient Details  Name: Johnny Sullivan MRN: [id number] Date of Birth: [birth year] Referring Provider: Dr. Rosalin Hawking  Encounter Date: 11/22/2016      End of Session - 11/22/16 1459    Visit Number 20   Number of Visits 23   Date for SLP Re-Evaluation 12/02/16   SLP Start Time 0933   SLP Stop Time  1016   SLP Time Calculation (min) 43 min      Past Medical History:  Diagnosis Date  . Arthritis    Osteoarthritis- right hip  . Coronary artery disease   . GERD (gastroesophageal reflux disease)   . Hypertension   . Myocardial infarction ([place])    " mild"-Dr. Debbora Lacrosse follows -gives clearance.  Marland Kitchen Urethral stricture    'uses self [place] caths weekly" to correct.    Past Surgical History:  Procedure Laterality Date  . BACK SURGERY     lumbar discectomy  . CARDIAC CATHETERIZATION    . CORONARY ARTERY BYPASS GRAFT     09-20-2012 x3 -[place]  . IR PERCUTANEOUS ART THROMBECTOMY/INFUSION INTRACRANIAL INC DIAG ANGIO  09/26/2016  . LOOP RECORDER INSERTION N/A 09/30/2016   Procedure: Loop Recorder Insertion;  Surgeon: Thompson Grayer, MD;  Location: [facility];  Service: Cardiovascular;  Laterality: N/A;  . RADIOLOGY WITH ANESTHESIA N/A 09/26/2016   Procedure: RADIOLOGY WITH ANESTHESIA;  Surgeon: Luanne Bras, MD;  Location: [facility];  Service: Radiology;  Laterality: N/A;  . TEE WITHOUT CARDIOVERSION N/A 09/30/2016   Procedure: TRANSESOPHAGEAL ECHOCARDIOGRAM (TEE);  Surgeon: Larey Dresser, MD;  Location: [facility] ENDOSCOPY;  Service: Cardiovascular;  Laterality: N/A;  . TOTAL HIP ARTHROPLASTY Right 04/26/2016   Procedure: RIGHT TOTAL HIP ARTHROPLASTY ANTERIOR APPROACH;  Surgeon: Paralee Cancel, MD;  [facility];  Service: Orthopedics;  Laterality: Right;  Marland Kitchen  VASECTOMY    . WRIST SURGERY     "implant of plastic bone"-mild ROM limits    There were no vitals filed for this visit.      Subjective Assessment - 11/22/16 1455    Currently in Pain? No/denies               ADULT SLP TREATMENT - 11/22/16 0935      General Information   Behavior/Cognition Alert;Cooperative;Pleasant mood     Treatment Provided   Treatment provided Cognitive-Linquistic     Pain Assessment   Pain Assessment No/denies pain     Cognitive-Linquistic Treatment   Treatment focused on Aphasia;Apraxia;Patient/family/caregiver education   Skilled Treatment Facilitated written expression answering questions re: recent activities with usual questioning for clarification. Pt writes high frequency words 75% accuracy, however lower frequency, abstract words with max A. Spelling errors persist with max A to correct. Pt continues to require max A to write fucntion words in simple phrases. Verbal expressinon of vc consonants with frequent mod A - initially separated each sound with pause in between and hand cues, improvement with alternating from voiced vowel to voiceless consonant     Assessment / Recommendations / [place] with current plan of care     Progression Toward Goals   Progression toward goals Progressing toward goals            SLP Short Term Goals - 11/22/16 1459      SLP SHORT TERM GOAL #1   Title Pt will answer simple  yes/no questions accurately, either by head nod, pointing to the correct word, or verbalizing "yes" or "no"   Status Partially Met     SLP SHORT TERM GOAL #2   Title Pt will follow 1-step verbal directions consistently, given mod (verbal/visual/tactile) cues.   Status Achieved     SLP SHORT TERM GOAL #3   Title Pt will imitate 3 sounds accurately given max visual and verbal cues   Status Achieved     SLP SHORT TERM GOAL #4   Title Pt will participate in further assessment of reading comprehension and written  expression   Status Achieved     SLP SHORT TERM GOAL #5   Title Pt will demonstrate comprehension of short phrases with 80% accuracy, given mod cues   Status Achieved     SLP SHORT TERM GOAL #6   Title Pt will write single words with 95% accuracy, given min cues.   Status Achieved     SLP SHORT TERM GOAL #7   Title Pt will demonstrate use of AAC/ multimodal communication to respond to simple questions with min-mod A.   Status Partially Met          SLP Long Term Goals - 11/22/16 1459      SLP LONG TERM GOAL #1   Title Pt will answer complex yes/no questions accurately, either by head nod, pointing to the correct word, or verbalizing "yes" or "no"   Time 3   Period Weeks   Status On-going     SLP LONG TERM GOAL #2   Title Pt will follow 2-step verbal directions consistently, given min (verbal only) cues.   Time 3   Period Weeks   Status On-going     SLP LONG TERM GOAL #3   Title Pt will imitate 5 sounds accurately given max visual and verbal cues   Status Achieved     SLP LONG TERM GOAL #4   Title Pt will demonstrate comprehension of sentence length material with 80% accuracy   Time 3   Period Weeks   Status On-going     SLP LONG TERM GOAL #5   Title Pt will write short phrases with 80% accuracy   Time 3   Period Weeks   Status On-going     SLP LONG TERM GOAL #6   Title Pt/family will report 25% improvement in expressive communication with use of AAC/multimodal means of communication.   Time 3   Period Weeks   Status On-going          Plan - 11/22/16 1457    Clinical Impression Statement Pt continues to show incremental improvements in speech, however functional speech not yet achieved. Written expression to augment verbal expression with writing simple content words at word level with rare min A, however generating phrases with function words required max A. Continue skilled ST to maximize  carryover of multimodal communication.   Speech Therapy Frequency  3x / week   Treatment/Interventions Cueing hierarchy;SLP instruction and feedback;Language facilitation;Environmental controls;Functional tasks;Compensatory strategies;Multimodal communcation approach;Patient/family education;Compensatory techniques   Potential to Achieve Goals Fair   Potential Considerations Ability to learn/carryover information;Severity of impairments;Cooperation/participation level;Family/community support;Previous level of function      Patient will benefit from skilled therapeutic intervention in order to improve the following deficits and impairments:   Aphasia following other cerebrovascular disease  Apraxia    Problem List Patient Active Problem List   Diagnosis Date Noted  . Acute embolic stroke ([place])   . Cerebrovascular accident (CVA)  due to thrombosis of precerebral artery ([place])   . Cerebrovascular accident (CVA) ([place]) 09/26/2016  . S/P right THA, AA 04/26/2016    Aycen Porreca, Annye Rusk MS, CCC-SLP 11/22/2016, 3:00 PM  [facility] [address] [place], [place], [address] Phone: [phone]   Fax:  [phone]   Name: Majesty Oehlert MRN: [id number] Date of Birth: 03-15-1956

## 2016-11-23 ENCOUNTER — Other Ambulatory Visit: Payer: Self-pay

## 2016-11-23 ENCOUNTER — Telehealth: Payer: Self-pay

## 2016-11-23 DIAGNOSIS — I69320 Aphasia following cerebral infarction: Secondary | ICD-10-CM

## 2016-11-23 NOTE — Telephone Encounter (Signed)
[facility] at [phone]. Rn stated a order can be sign for patients therapy. Rn also stated Dr.Xu will not return to office till July 23. RN did speech therapy with Dr.Sethi approval. Dr. Pearlean BrownieSethi sign speech therapy for Dr. Roda ShuttersXu because he was not available. [facility]

## 2016-11-23 NOTE — Telephone Encounter (Signed)
Speech therapy orders fax to [facility] at [phone]. Form fax twice and confirmed.

## 2016-11-24 ENCOUNTER — Ambulatory Visit: Payer: BLUE CROSS/BLUE SHIELD | Admitting: Speech Pathology

## 2016-11-24 DIAGNOSIS — I69820 Aphasia following other cerebrovascular disease: Secondary | ICD-10-CM

## 2016-11-24 DIAGNOSIS — R482 Apraxia: Secondary | ICD-10-CM

## 2016-11-24 NOTE — Therapy (Signed)
Johnny Sullivan [address] [place], [place], [address] Phone: [phone]   Fax:  [phone]  Speech Language Pathology Treatment  Patient Details  Name: Johnny Sullivan MRN: [id number] Date of Birth: 1955/05/17 Referring Provider: Dr. Rosalin Sullivan  Encounter Date: 11/24/2016      End of Session - 11/24/16 1115    Visit Number 21   Number of Visits 23   Date for SLP Re-Evaluation 12/02/16   SLP Start Time 0931   SLP Stop Time  1950   SLP Time Calculation (min) 44 min   Activity Tolerance Patient tolerated treatment well      Past Medical History:  Diagnosis Date  . Arthritis    Osteoarthritis- right hip  . Coronary artery disease   . GERD (gastroesophageal reflux disease)   . Hypertension   . Myocardial infarction ([place])    " mild"-Dr. Debbora Sullivan follows -gives clearance.  Marland Kitchen Urethral stricture    'uses self [place] caths weekly" to correct.    Past Surgical History:  Procedure Laterality Date  . BACK SURGERY     lumbar discectomy  . CARDIAC CATHETERIZATION    . CORONARY ARTERY BYPASS GRAFT     09-20-2012 x3 -[place]  . IR PERCUTANEOUS ART THROMBECTOMY/INFUSION INTRACRANIAL INC DIAG ANGIO  09/26/2016  . LOOP RECORDER INSERTION N/A 09/30/2016   Procedure: Loop Recorder Insertion;  Surgeon: Johnny Grayer, MD;  Location: [facility];  Service: Cardiovascular;  Laterality: N/A;  . RADIOLOGY WITH ANESTHESIA N/A 09/26/2016   Procedure: RADIOLOGY WITH ANESTHESIA;  Surgeon: Johnny Bras, MD;  Location: [facility];  Service: Radiology;  Laterality: N/A;  . TEE WITHOUT CARDIOVERSION N/A 09/30/2016   Procedure: TRANSESOPHAGEAL ECHOCARDIOGRAM (TEE);  Surgeon: Johnny Dresser, MD;  Location: [facility] ENDOSCOPY;  Service: Cardiovascular;  Laterality: N/A;  . TOTAL HIP ARTHROPLASTY Right 04/26/2016   Procedure: RIGHT TOTAL HIP ARTHROPLASTY ANTERIOR APPROACH;  Surgeon: Johnny Cancel, MD;  Location: [facility];  Service: Orthopedics;  Laterality: Right;  Marland Kitchen VASECTOMY    . WRIST SURGERY     "implant of plastic bone"-mild ROM limits    There were no vitals filed for this visit.      Subjective Assessment - 11/24/16 1109    Subjective "I helped him get started, then he comleted the homework by himself"   Patient is accompained by: Family member   Special Tests son Johnny Sullivan   Currently in Pain? No/denies               ADULT SLP TREATMENT - 11/24/16 1008      General Information   Behavior/Cognition Alert;Cooperative;Pleasant mood     Treatment Provided   Treatment provided Cognitive-Linquistic     Pain Assessment   Pain Assessment No/denies pain     Cognitive-Linquistic Treatment   Treatment focused on Aphasia;Apraxia;Patient/family/caregiver education   Skilled Treatment Written expression of simple verbs with picture stimuli, less than 50% accurate - he consistently wrote the object or wrote the object with "ing" at the end (ie: ballooning/blowing) He required frequent mod to max A to write  basic action words. Verbal expression with vc and vcv syllables with consistent mod A, however he has improved combinining voice and voiceless sounds.     Assessment / Recommendations / Plan   Plan Continue with current plan of care     Progression Toward Goals   Progression toward goals Progressing toward goals            SLP Short Term Goals -  11/24/16 [place] #1   Title Pt will answer simple yes/no questions accurately, either by head nod, pointing to the correct word, or verbalizing "yes" or "no"   Status Partially Met     SLP SHORT TERM GOAL #2   Title Pt will follow 1-step verbal directions consistently, given mod (verbal/visual/tactile) cues.   Status Achieved     SLP SHORT TERM GOAL #3   Title Pt will imitate 3 sounds accurately given max visual and verbal cues   Status Achieved     SLP SHORT TERM GOAL #4   Title Pt will participate in  further assessment of reading comprehension and written expression   Status Achieved     SLP SHORT TERM GOAL #5   Title Pt will demonstrate comprehension of short phrases with 80% accuracy, given mod cues   Status Achieved     SLP SHORT TERM GOAL #6   Title Pt will write single words with 95% accuracy, given min cues.   Status Achieved     SLP SHORT TERM GOAL #7   Title Pt will demonstrate use of AAC/ multimodal communication to respond to simple questions with min-mod A.   Status Partially Met          SLP Long Term Goals - 11/24/16 1115      SLP LONG TERM GOAL #1   Title Pt will answer complex yes/no questions accurately, either by head nod, pointing to the correct word, or verbalizing "yes" or "no"   Time 3   Period Weeks   Status On-going     SLP LONG TERM GOAL #2   Title Pt will follow 2-step verbal directions consistently, given min (verbal only) cues.   Time 3   Period Weeks   Status On-going     SLP LONG TERM GOAL #3   Title Pt will imitate 5 sounds accurately given max visual and verbal cues   Status Achieved     SLP LONG TERM GOAL #4   Title Pt will demonstrate comprehension of sentence length material with 80% accuracy   Time 3   Period Weeks   Status On-going     SLP LONG TERM GOAL #5   Title Pt will write short phrases with 80% accuracy   Time 3   Period Weeks   Status On-going     SLP LONG TERM GOAL #6   Title Pt/family will report 25% improvement in expressive communication with use of AAC/multimodal means of communication.   Time 3   Period Weeks   Status On-going          Plan - 11/24/16 1114    Clinical Impression Statement Pt continues to show incremental improvements in speech, however functional speech not yet achieved. Written expression to augment verbal expression with writing simple content words at word level with rare min A, however generating verbs/action words required max A. Continue skilled ST to maximize  carryover of  multimodal communication.   Speech Therapy Frequency 3x / week   Treatment/Interventions Cueing hierarchy;SLP instruction and feedback;Language facilitation;Environmental controls;Functional tasks;Compensatory strategies;Multimodal communcation approach;Patient/family education;Compensatory techniques   Potential to Achieve Goals Fair   Potential Considerations Ability to learn/carryover information;Severity of impairments;Cooperation/participation level;Family/community support;Previous level of function   Consulted and Agree with Plan of Care Patient;Family member/caregiver   Family Member Consulted son Johnny Sullivan      Patient will benefit from skilled therapeutic intervention in order to improve the following deficits and impairments:  Aphasia following other cerebrovascular disease  Apraxia    Problem List Patient Active Problem List   Diagnosis Date Noted  . Acute embolic stroke ([place])   . Cerebrovascular accident (CVA) due to thrombosis of precerebral artery ([facility])   . Cerebrovascular accident (CVA) ([place]) 09/26/2016  . S/P right THA, AA 04/26/2016    Victorya Hillman, Annye Rusk MS, CCC-SLP 11/24/2016, 11:16 AM  [facility] [address] [place], [place], [address] Phone: [phone]   Fax:  [phone]   Name: Johnny Sullivan MRN: [id number] Date of Birth: 1955-07-18

## 2016-11-25 ENCOUNTER — Ambulatory Visit: Payer: BLUE CROSS/BLUE SHIELD | Admitting: *Deleted

## 2016-11-25 DIAGNOSIS — R482 Apraxia: Secondary | ICD-10-CM | POA: Diagnosis not present

## 2016-11-25 NOTE — Therapy (Signed)
[facility] [address] [place], [place], [address] Phone: [phone]   Fax:  [phone]  Speech Language Pathology Treatment  Patient Details  Name: Johnny Sullivan MRN: [id number] Date of Birth: 01-Jun-1955 Referring Provider: Dr. Rosalin Hawking  Encounter Date: 11/25/2016      End of Session - 11/25/16 1248    Visit Number 22   Number of Visits 23   Date for SLP Re-Evaluation 12/02/16   SLP Start Time 7673   SLP Stop Time  1100   SLP Time Calculation (min) 45 min   Activity Tolerance Patient tolerated treatment well      Past Medical History:  Diagnosis Date  . Arthritis    Osteoarthritis- right hip  . Coronary artery disease   . GERD (gastroesophageal reflux disease)   . Hypertension   . Myocardial infarction ([place])    " mild"-Dr. Debbora Lacrosse follows -gives clearance.  Marland Kitchen Urethral stricture    'uses self [place] caths weekly" to correct.    Past Surgical History:  Procedure Laterality Date  . BACK SURGERY     lumbar discectomy  . CARDIAC CATHETERIZATION    . CORONARY ARTERY BYPASS GRAFT     09-20-2012 x3 -[place]  . IR PERCUTANEOUS ART THROMBECTOMY/INFUSION INTRACRANIAL INC DIAG ANGIO  09/26/2016  . LOOP RECORDER INSERTION N/A 09/30/2016   Procedure: Loop Recorder Insertion;  Surgeon: Thompson Grayer, MD;  Location: [facility];  Service: Cardiovascular;  Laterality: N/A;  . RADIOLOGY WITH ANESTHESIA N/A 09/26/2016   Procedure: RADIOLOGY WITH ANESTHESIA;  Surgeon: Luanne Bras, MD;  Location: [facility];  Service: Radiology;  Laterality: N/A;  . TEE WITHOUT CARDIOVERSION N/A 09/30/2016   Procedure: TRANSESOPHAGEAL ECHOCARDIOGRAM (TEE);  Surgeon: Larey Dresser, MD;  Location: [facility] ENDOSCOPY;  Service: Cardiovascular;  Laterality: N/A;  . TOTAL HIP ARTHROPLASTY Right 04/26/2016   Procedure: RIGHT TOTAL HIP ARTHROPLASTY ANTERIOR APPROACH;  Surgeon: Paralee Cancel, MD;  [facility];  Service: Orthopedics;  Laterality: Right;  Marland Kitchen VASECTOMY    . WRIST SURGERY     "implant of plastic bone"-mild ROM limits    There were no vitals filed for this visit.      Subjective Assessment - 11/25/16 1023    Subjective "He doesn't see the progress he's made. He wants to know when he is going to be back to normal."   Patient is accompained by: Family member  daughter Cyril Mourning   Currently in Pain? No/denies               ADULT SLP TREATMENT - 11/25/16 0001      General Information   Behavior/Cognition Alert;Cooperative;Pleasant mood     Treatment Provided   Treatment provided Cognitive-Linquistic     Pain Assessment   Pain Assessment No/denies pain     Cognitive-Linquistic Treatment   Treatment focused on Aphasia;Apraxia;Patient/family/caregiver education   Skilled Treatment ST session focused on improving effective communication using written expression. Pt able to answer most questions with written response, but has more difficulty if he is upset or frustrated. Cyril Mourning was encouraged to continue to practice writing responses to questions in daily interaction, to continue to increase accuracy and effectiveness.      Assessment / Recommendations / Plan   Plan Continue with current plan of care     Progression Toward Goals   Progression toward goals Progressing toward goals          SLP Education - 11/25/16 1247    Education provided Yes  Education Details continue using written expression/texting for functional communication.   Person(s) Educated Patient;Child(ren)   Methods Explanation;Demonstration;Verbal cues   Comprehension Verbalized understanding;Returned demonstration          SLP Short Term Goals - 11/25/16 1252      SLP SHORT TERM GOAL #1   Title Pt will answer simple yes/no questions accurately, either by head nod, pointing to the correct word, or verbalizing "yes" or "no"   Status Partially Met     SLP SHORT TERM GOAL #2   Title  Pt will follow 1-step verbal directions consistently, given mod (verbal/visual/tactile) cues.   Status Achieved     SLP SHORT TERM GOAL #3   Title Pt will imitate 3 sounds accurately given max visual and verbal cues   Status Achieved     SLP SHORT TERM GOAL #4   Title Pt will participate in further assessment of reading comprehension and written expression   Status Achieved     SLP SHORT TERM GOAL #5   Title Pt will demonstrate comprehension of short phrases with 80% accuracy, given mod cues   Status Achieved     SLP SHORT TERM GOAL #6   Title Pt will write single words with 95% accuracy, given min cues.   Status Achieved     SLP SHORT TERM GOAL #7   Title Pt will demonstrate use of AAC/ multimodal communication to respond to simple questions with min-mod A.   Status Partially Met          SLP Long Term Goals - 11/25/16 1253      SLP LONG TERM GOAL #1   Title Pt will answer complex yes/no questions accurately, either by head nod, pointing to the correct word, or verbalizing "yes" or "no"   Time 3   Period Weeks   Status On-going     SLP LONG TERM GOAL #2   Title Pt will follow 2-step verbal directions consistently, given min (verbal only) cues.   Time 3   Period Weeks   Status On-going     SLP LONG TERM GOAL #3   Title Pt will imitate 5 sounds accurately given max visual and verbal cues   Status Achieved     SLP LONG TERM GOAL #4   Title Pt will demonstrate comprehension of sentence length material with 80% accuracy   Status Achieved     SLP LONG TERM GOAL #5   Title Pt will write short phrases with 80% accuracy   Time 3   Period Weeks   Status On-going     SLP LONG TERM GOAL #6   Title Pt/family will report 25% improvement in expressive communication with use of AAC/multimodal means of communication.   Time 3   Period Weeks   Status On-going          Plan - 11/25/16 1248    Clinical Impression Statement Pt is increasingly able to effectively answer  questions by writing or texting, but gets frustrated due to lack of functional speech. Continued ST intervention is recommended for improvement of functional communication skills.   Speech Therapy Frequency 3x / week   Duration --  8 weeks   Treatment/Interventions Cueing hierarchy;SLP instruction and feedback;Language facilitation;Environmental controls;Functional tasks;Compensatory strategies;Multimodal communcation approach;Patient/family education;Compensatory techniques   Potential to Achieve Goals Fair   Potential Considerations Ability to learn/carryover information;Severity of impairments;Cooperation/participation level;Family/community support;Previous level of function   SLP Home Exercise Plan reviewed   Consulted and Agree with Plan of Care Patient;Family member/caregiver  Family Member Consulted daughter Cyril Mourning      Patient will benefit from skilled therapeutic intervention in order to improve the following deficits and impairments:   Apraxia    Problem List Patient Active Problem List   Diagnosis Date Noted  . Acute embolic stroke ([place])   . Cerebrovascular accident (CVA) due to thrombosis of precerebral artery ([place])   . Cerebrovascular accident (CVA) ([facility]) 09/26/2016  . S/P right THA, AA 04/26/2016   Demetria Iwai B. [place], MSP, CCC-SLP  Shonna Chock 11/25/2016, 12:55 PM  [facility] [address] [place], [place], [address] Phone: [phone]   Fax:  [phone]   Name: Johnny Sullivan MRN: [id number] Date of Birth: [birth year]

## 2016-11-29 ENCOUNTER — Ambulatory Visit (INDEPENDENT_AMBULATORY_CARE_PROVIDER_SITE_OTHER): Payer: BLUE CROSS/BLUE SHIELD | Admitting: *Deleted

## 2016-11-29 ENCOUNTER — Ambulatory Visit: Payer: BLUE CROSS/BLUE SHIELD | Admitting: Speech Pathology

## 2016-11-29 DIAGNOSIS — I639 Cerebral infarction, unspecified: Secondary | ICD-10-CM | POA: Diagnosis not present

## 2016-11-29 DIAGNOSIS — I69890 Apraxia following other cerebrovascular disease: Secondary | ICD-10-CM

## 2016-11-29 DIAGNOSIS — I69820 Aphasia following other cerebrovascular disease: Secondary | ICD-10-CM

## 2016-11-29 DIAGNOSIS — R482 Apraxia: Secondary | ICD-10-CM | POA: Diagnosis not present

## 2016-11-29 NOTE — Therapy (Signed)
[facility] [address] [place], [place], [address] Phone: [phone]   Fax:  [phone]  Speech Language Pathology Treatment  Patient Details  Name: Johnny Sullivan MRN: [id number] Date of Birth: Apr 14, 1956 Referring Provider: Dr. Rosalin Hawking  Encounter Date: 11/29/2016      End of Session - 11/29/16 1035    Visit Number 23   Number of Visits 23   Date for SLP Re-Evaluation 12/02/16   SLP Start Time 0932   SLP Stop Time  1016   SLP Time Calculation (min) 44 min   Activity Tolerance Patient tolerated treatment well      Past Medical History:  Diagnosis Date  . Arthritis    Osteoarthritis- right hip  . Coronary artery disease   . GERD (gastroesophageal reflux disease)   . Hypertension   . Myocardial infarction ([place])    " mild"-Dr. Debbora Lacrosse follows -gives clearance.  Marland Kitchen Urethral stricture    'uses self [place] caths weekly" to correct.    Past Surgical History:  Procedure Laterality Date  . BACK SURGERY     lumbar discectomy  . CARDIAC CATHETERIZATION    . CORONARY ARTERY BYPASS GRAFT     09-20-2012 x3 -[place]  . IR PERCUTANEOUS ART THROMBECTOMY/INFUSION INTRACRANIAL INC DIAG ANGIO  09/26/2016  . LOOP RECORDER INSERTION N/A 09/30/2016   Procedure: Loop Recorder Insertion;  Surgeon: Thompson Grayer, MD;  Location: [facility];  Service: Cardiovascular;  Laterality: N/A;  . RADIOLOGY WITH ANESTHESIA N/A 09/26/2016   Procedure: RADIOLOGY WITH ANESTHESIA;  Surgeon: Luanne Bras, MD;  Location: [facility];  Service: Radiology;  Laterality: N/A;  . TEE WITHOUT CARDIOVERSION N/A 09/30/2016   Procedure: TRANSESOPHAGEAL ECHOCARDIOGRAM (TEE);  Surgeon: Larey Dresser, MD;  Location: [facility] ENDOSCOPY;  Service: Cardiovascular;  Laterality: N/A;  . TOTAL HIP ARTHROPLASTY Right 04/26/2016   Procedure: RIGHT TOTAL HIP ARTHROPLASTY ANTERIOR APPROACH;  Surgeon: Paralee Cancel, MD;  [facility];  Service: Orthopedics;  Laterality: Right;  Marland Kitchen VASECTOMY    . WRIST SURGERY     "implant of plastic bone"-mild ROM limits    There were no vitals filed for this visit.      Subjective Assessment - 11/29/16 1029    Special Tests sister                ADULT SLP TREATMENT - 11/29/16 0936      General Information   Behavior/Cognition Alert;Cooperative;Pleasant mood     Treatment Provided   Treatment provided Cognitive-Linquistic     Pain Assessment   Pain Assessment No/denies pain     Cognitive-Linquistic Treatment   Treatment focused on Aphasia;Apraxia;Patient/family/caregiver education   Skilled Treatment Written expression of verbs with frequent max A - pt less than 50% successful with writing verbs independently. Pt also required consistent max A for error awareness (ie: when he wrote "battering and cakeing for baking/mixing). Verbal expression with imitation of cv syllables, including /ka/ for 1 st time in ST - also spontaneous utterances "IDK" "OK" coming out. Automatic speech counting with much improved approximation of numbers - 75% accurate with consistent mod  Auditory comprehension at sentence level/1 step commands when not in context. Sister reports pt 60% accurate in communicating via writing.      Assessment / Recommendations / Plan   Plan Discharge SLP treatment due to (comment)  family needs ST closer to their home     Progression Toward Goals   Progression toward goals Progressing toward goals  SLP Education - 11/29/16 1029    Education provided Yes   Education Details continue automatic speech practice, written expression of verbs and adjectives, continue speech therapy   Person(s) Educated Patient   Methods Explanation;Demonstration   Comprehension Verbalized understanding;Returned demonstration     SPEECH THERAPY DISCHARGE SUMMARY  Visits from Start of Care: 23  Current functional level related to goals / functional  outcomes: See goals below   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  Patient goals were partially met. Patient is being discharged due to the patient's request.  ?????          SLP Short Term Goals - 11/29/16 1033      SLP SHORT TERM GOAL #1   Title Pt will answer simple yes/no questions accurately, either by head nod, pointing to the correct word, or verbalizing "yes" or "no"   Status Partially Met     SLP SHORT TERM GOAL #2   Title Pt will follow 1-step verbal directions consistently, given mod (verbal/visual/tactile) cues.   Status Achieved     SLP SHORT TERM GOAL #3   Title Pt will imitate 3 sounds accurately given max visual and verbal cues   Status Achieved     SLP SHORT TERM GOAL #4   Title Pt will participate in further assessment of reading comprehension and written expression   Status Achieved     SLP SHORT TERM GOAL #5   Title Pt will demonstrate comprehension of short phrases with 80% accuracy, given mod cues   Status Achieved     SLP SHORT TERM GOAL #6   Title Pt will write single words with 95% accuracy, given min cues.   Status Achieved     SLP SHORT TERM GOAL #7   Title Pt will demonstrate use of AAC/ multimodal communication to respond to simple questions with min-mod A.   Status Partially Met          SLP Long Term Goals - 11/29/16 1034      SLP LONG TERM GOAL #1   Title Pt will answer complex yes/no questions accurately, either by head nod, pointing to the correct word, or verbalizing "yes" or "no"   Time 3   Period Weeks   Status Achieved     SLP LONG TERM GOAL #2   Title Pt will follow 2-step verbal directions consistently, given min (verbal only) cues.   Time 3   Period Weeks     SLP LONG TERM GOAL #3   Title Pt will imitate 5 sounds accurately given max visual and verbal cues   Status Achieved      SLP LONG TERM GOAL #4   Title Pt will demonstrate comprehension of sentence length material with 80% accuracy   Status Achieved     SLP LONG TERM GOAL #5   Title Pt will write short phrases with 80% accuracy   Time 3   Period Weeks   Status Not Met     SLP LONG TERM GOAL #6   Title Pt/family will report 25% improvement in expressive communication with use of AAC/multimodal means of communication.   Time 3   Period Weeks   Status Achieved          Plan - 11/29/16 1030    Clinical Impression Statement Pt continues to progress with written expression at word level to answer open ended question - he requires  max A to write verbs and combine subject/verb writing. Motor speech continues to improve slowly, pt making new gains in spontaneous utterances "IDK/OK" He is producing cv and cvc syllables with mod A. Pt and family are requesting pt be d/c'd from this facility due to far distance from their home. They plan to continue ST closer to home in [place]. I agree with this. Pt and family deny cognitive concerns.    Treatment/Interventions Cueing hierarchy;SLP instruction and feedback;Language facilitation;Environmental controls;Functional tasks;Compensatory strategies;Multimodal communcation approach;Patient/family education;Compensatory techniques   Potential to Achieve Goals Fair   Potential Considerations Ability to learn/carryover information;Severity of impairments;Cooperation/participation level;Family/community support;Previous level of function   Consulted and Agree with Plan of Care Patient;Family member/caregiver   Family Member Consulted sister      Patient will benefit from skilled therapeutic intervention in order to improve the following deficits and impairments:   Aphasia following other cerebrovascular disease  Apraxia following other cerebrovascular disease    Problem List Patient Active Problem List   Diagnosis Date Noted  . Acute embolic stroke ([place])   .  Cerebrovascular accident (CVA) due to thrombosis of precerebral artery ([place])   . Cerebrovascular accident (CVA) ([place]) 09/26/2016  . S/P right THA, AA 04/26/2016    Felise [place], Annye Rusk MS, CCC-SLP 11/29/2016, 10:36 AM  [facility] [address] [place], [place], [address] Phone: [phone]   Fax:  [phone]   Name: Shooter Tangen MRN: [id number] Date of Birth: [birth year]

## 2016-11-30 ENCOUNTER — Encounter: Payer: Self-pay | Admitting: Nurse Practitioner

## 2016-11-30 ENCOUNTER — Ambulatory Visit (INDEPENDENT_AMBULATORY_CARE_PROVIDER_SITE_OTHER): Payer: BLUE CROSS/BLUE SHIELD | Admitting: Nurse Practitioner

## 2016-11-30 VITALS — BP 140/86 | HR 74 | Ht 70.0 in | Wt 220.6 lb

## 2016-11-30 DIAGNOSIS — E785 Hyperlipidemia, unspecified: Secondary | ICD-10-CM | POA: Insufficient documentation

## 2016-11-30 DIAGNOSIS — R4701 Aphasia: Secondary | ICD-10-CM

## 2016-11-30 DIAGNOSIS — I10 Essential (primary) hypertension: Secondary | ICD-10-CM

## 2016-11-30 DIAGNOSIS — I639 Cerebral infarction, unspecified: Secondary | ICD-10-CM | POA: Diagnosis not present

## 2016-11-30 NOTE — Progress Notes (Addendum)
[facility]  PATIENT: Johnny Sullivan DOB: 1955/10/25   REASON FOR VISIT: Hospital follow-up for acute embolic stroke HISTORY FROM: Patient and wife    HISTORY OF PRESENT ILLNESS:FROM RECORDPhillip Wilkinsonis an 61 y.o.malewho presented to [facility] with stroke symptoms. LKN was 1815 this evening. 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 When found he was unable to talk. Per Johnny Sullivan ED physician, the patient was aphasic with flaccid right sided weakness.   CT head revealed an emergent large vessel occlusion affecting the left ICA terminus and proximal leftMCA with associated early hypoattenuation of the leftinsula and left anterolateral temporal cortex. ASPECTS was8.  Teleneurology consultation was obtained and IV tPA was recommended, followed by a STAT CTA head and emergent transfer to [facility] for possible catheter based clot retrieval.  CTA completed at 8:09 PM, revealing a left ICA occlusion extending from just distal to the carotid bifurcation to just proximal to the carotid terminus. A short occluded segment of the proximal left MCA is also noted (preliminary interpretation by neurologist).  EKG at OSH showed no atrial fibrillation.  Per his wife, he takes daily aspirin and is on Lipitor 80 mg po qd. Formerly was on DAPT with Plavix and ASA for 2 years following previous CABG surgery.  GLO:[id number] tPA Given:Yes, at outside hospital. The patient received IV TPA on Monday, 09/26/2016 at 1915 hrs. Repeat MRI of the brain 09/27/2016 large acute left MCA territory infarct and mild chronic small vascular disease. MRA of the head successful revascularization of left internal carotid artery. 2-D echo EF 50-55%. Loop recorder was placed. LDL 58. Hemoglobin A1c 6.3. Interval history7/25/18 Johnny Sullivan, 61 year old male returns for follow-up after embolic stroke. He is currently on aspirin 325 daily  with minimal bruising and no bleeding. He has not had recurrent stroke or TIA symptoms. He remains on Lipitor without myalgias. Occupational therapy has stopped however he is continuing to get speech therapy for global aphasia. Patient lives in [place] and sees his cardiologist there. Blood pressure in the office today 140/86 Loop recorder so far has not shown any evidence of atrial fibrillation. Patient cannot go back to work until his speech improves. He returns for reevaluation  REVIEW OF SYSTEMS: Full 14 system review of systems performed and notable only for those listed, all others are neg:  Constitutional: neg  Cardiovascular: neg Ear/Nose/Throat: neg  Skin: neg Eyes: neg Respiratory: neg Gastroitestinal: neg  Hematology/Lymphatic: neg  Endocrine: neg Musculoskeletal:neg Allergy/Immunology: neg Neurological: Speech difficulty Psychiatric: neg Sleep : neg   ALLERGIES: Allergies  Allergen Reactions  . Alteplase Swelling and Other (See Comments)    Angioedema   . Vancomycin Other (See Comments)    Redman syndrome    HOME MEDICATIONS: Outpatient Medications Prior to Visit  Medication Sig Dispense Refill  . aspirin 325 MG tablet Take 1 tablet (325 mg total) by mouth daily.    Marland Kitchen atorvastatin (LIPITOR) 80 MG tablet Take 80 mg by mouth daily at 6 PM.   3  . folic acid (FOLVITE) 1 MG tablet Take 1 tablet (1 mg total) by mouth daily.    . ranitidine (ZANTAC) 150 MG capsule Take 150 mg by mouth daily as needed for heartburn.     . thiamine 100 MG tablet Take 1 tablet (100 mg total) by mouth daily.    Marland Kitchen amoxicillin (AMOXIL) 500 MG capsule Take 2,000 mg by mouth See admin instructions. ONE HOUR  PRIOR TO DENTAL APPOINTMENT  0  . metoprolol tartrate (LOPRESSOR) 25 MG tablet Take 25 mg by mouth 2 (two) times daily.   3   No facility-administered medications prior to visit.     PAST MEDICAL HISTORY: Past Medical History:  Diagnosis Date  . Arthritis    Osteoarthritis-  right hip  . Coronary artery disease   . GERD (gastroesophageal reflux disease)   . Hypertension   . Myocardial infarction (HCC)    " mild"-Dr. Abbey ChattersZakhary,cardiolgy-[place] follows -gives clearance.  Marland Kitchen. Urethral stricture    'uses self [place] caths weekly" to correct.    PAST SURGICAL HISTORY: Past Surgical History:  Procedure Laterality Date  . BACK SURGERY     lumbar discectomy  . CARDIAC CATHETERIZATION    . CORONARY ARTERY BYPASS GRAFT     09-20-2012 x3 -[place]  . IR PERCUTANEOUS ART THROMBECTOMY/INFUSION INTRACRANIAL INC DIAG ANGIO  09/26/2016  . LOOP RECORDER INSERTION N/A 09/30/2016   Procedure: Loop Recorder Insertion;  Surgeon: Hillis RangeAllred, James, MD;  Location: [facility] INVASIVE CV LAB;  Service: Cardiovascular;  Laterality: N/A;  . RADIOLOGY WITH ANESTHESIA N/A 09/26/2016   Procedure: RADIOLOGY WITH ANESTHESIA;  Surgeon: Julieanne Cottoneveshwar, Sanjeev, MD;  Location: [facility] OR;  Service: Radiology;  Laterality: N/A;  . TEE WITHOUT CARDIOVERSION N/A 09/30/2016   Procedure: TRANSESOPHAGEAL ECHOCARDIOGRAM (TEE);  Surgeon: Laurey MoraleMcLean, Dalton S, MD;  [facility];  Service: Cardiovascular;  Laterality: N/A;  . TOTAL HIP ARTHROPLASTY Right 04/26/2016   Procedure: RIGHT TOTAL HIP ARTHROPLASTY ANTERIOR APPROACH;  Surgeon: Durene RomansMatthew Olin, MD;  Location: [facility] ORS;  Service: Orthopedics;  Laterality: Right;  Marland Kitchen. VASECTOMY    . WRIST SURGERY     "implant of plastic bone"-mild ROM limits    FAMILY HISTORY: Family History  Problem Relation Age of Onset  . Alzheimer's disease Mother     SOCIAL HISTORY: Social History   Social History  . Marital status: Married    Spouse name: Clydie BraunKaren  . Number of children: 4  . Years of education: 1316   Occupational History  .      retired from [employer]   Social History Main Topics  . Smoking status: Former Smoker    Packs/day: 1.50    Years: 15.00    Types: Cigarettes    Quit date: 04/20/2012  . Smokeless tobacco: [employer]    Types: Chew    Quit date:  04/20/2012  . Alcohol use Yes     Comment: occ. use  . Drug use: No  . Sexual activity: Yes   Other Topics Concern  . Not on file   Social History Narrative   Lives at home with wife   Caffeine- coffee, 2 cups daily, occas tea     PHYSICAL EXAM  Vitals:   11/30/16 0756  BP: (!) 140/86   Pulse: 74  Weight: 220 lb 9.6 oz (100.1 kg)  Height: 5\' 10"  (1.778 m)   Body mass index is 31.65 kg/m.  Generalized: Well developed, in no acute distress  Head: normocephalic and atraumatic,. Oropharynx benign  Neck: Supple, no carotid bruits  Cardiac: Regular rate rhythm, no murmur  Musculoskeletal: No deformity   Neurological examination   Mentation: Alert Has global aphasia and problems following commands. Able to pantomime. Not able to name or repeat objects.   Cranial nerve II-XII: Left pupil slightly smaller than right both react. Left gaze preference and right neglect  visual field were full on confrontational test. Facial sensation and strength were normal. hearing was intact to  finger rubbing bilaterally. Uvula tongue midline. head turning and shoulder shrug were normal and symmetric.Tongue protrusion into cheek strength was normal. Motor: normal bulk and tone, full strength in the BUE, BLE, fine finger movements normal, no pronator drift. No focal weakness Sensory: normal and symmetric to light touch, the upper and lower extremities Coordination: finger-nose-finger, heel-to-shin bilaterally, no dysmetria, no tremor Reflexes: 1+ upper lower and symmetric plantar responses were flexor bilaterally. Gait and Station: Rising up from seated position without assistance, normal stance,  moderate stride, good arm swing, smooth turning, able to perform tiptoe, and heel walking without difficulty. Tandem gait is unsteady  DIAGNOSTIC DATA (LABS, IMAGING, TESTING) - I reviewed patient records, labs, notes, testing and imaging myself where available.  Lab Results  Component Value Date    WBC 12.3 (H) 09/30/2016   HGB 11.2 (L) 09/30/2016   HCT 35.4 (L) 09/30/2016   MCV 84.3 09/30/2016   PLT 325 09/30/2016      Component Value Date/Time   NA 140 09/30/2016 0522   K 3.9 09/30/2016 0522   CL 104 09/30/2016 0522   CO2 26 09/30/2016 0522   GLUCOSE 108 (H) 09/30/2016 0522   BUN 14 09/30/2016 0522   CREATININE 0.75 09/30/2016 0522   CALCIUM 8.9 09/30/2016 0522   PROT 8.0 09/26/2016 1851   ALBUMIN 4.5 09/26/2016 1851   AST 34 09/26/2016 1851   ALT 28 09/26/2016 1851   ALKPHOS 95 09/26/2016 1851   BILITOT 0.5 09/26/2016 1851   GFRNONAA >60 09/30/2016 0522   GFRAA >60 09/30/2016 0522   Lab Results  Component Value Date   CHOL 108 09/27/2016   HDL 33 (L) 09/27/2016   LDLCALC 58 09/27/2016   TRIG 188 (H) 09/30/2016   CHOLHDL 3.3 09/27/2016   Lab Results  Component Value Date   HGBA1C 6.3 (H) 09/27/2016    ASSESSMENT AND PLAN  61 y.o. year old male  has a past medical history of Arthritis; Coronary artery disease; GERD (gastroesophageal reflux disease); Hypertension; Myocardial infarction [facility](HCC); and Urethral stricture. here for hospital follow-up for embolic stroke May 2018.The patient is a current patient of Dr. Roda ShuttersXu  who is out of the office today . This note is sent to the work in doctor.     Stressed the importance of management of risk factors to prevent further stroke Continue aspirin for secondary stroke prevention Maintain strict control of hypertension with blood pressure goal below 130/90, today's reading 140/86 continue antihypertensive medications Control of diabetes with hemoglobin A1c below 6.5 followed by primary care most recent hemoglobin A1c6.3  Cholesterol with LDL cholesterol less than 70, followed by primary care,  most recent 58 continue statin drugs Lipitor Continue ST ,Exercise by walking,   eat healthy diet with whole grains,  fresh fruits and vegetables Made wife and patient aware do not he needs to be left alone due to his global  aphasia Follow up 6 months Discussed risk for recurrent stroke/ TIA and answered additional questions This was a prolonged visit requiring 30 minutes and medical decision making of high complexity with extensive review of history, hospital chart, counseling and answering questions Nilda RiggsNancy Carolyn Martin, [facility] LPGNP, [facility]BC, APRN  [facility] [address], [address] [place], [place] [id number] [phone]  I reviewed the above note and documentation by the Nurse Practitioner and agree with the history, physical exam, assessment and plan as outlined above. I was immediately available for face-to-face consultation. Huston FoleySaima Athar, MD, PhD [facility])

## 2016-11-30 NOTE — Progress Notes (Signed)
[facility] Summary Report / Loop Recorder 

## 2016-11-30 NOTE — Patient Instructions (Signed)
Stressed the importance of management of risk factors to prevent further stroke Continue aspirin for secondary stroke prevention Maintain strict control of hypertension with blood pressure goal below 130/90, today's reading 140/86 continue antihypertensive medications Control of diabetes with hemoglobin A1c below 6.5 followed by primary care most recent hemoglobin A1c6.3  Cholesterol with LDL cholesterol less than 70, followed by primary care,  most recent 58 continue statin drugs Lipitor Continue ST ,Exercise by walking,   eat healthy diet with whole grains,  fresh fruits and vegetables Follow up 6 months

## 2016-12-01 ENCOUNTER — Encounter: Payer: BLUE CROSS/BLUE SHIELD | Admitting: Speech Pathology

## 2016-12-02 ENCOUNTER — Encounter: Payer: BLUE CROSS/BLUE SHIELD | Admitting: *Deleted

## 2016-12-15 LAB — CUP PACEART REMOTE DEVICE CHECK
MDC IDC PG IMPLANT DT: 20180525
MDC IDC SESS DTM: 20180724213750

## 2016-12-26 ENCOUNTER — Telehealth (HOSPITAL_COMMUNITY): Payer: Self-pay

## 2016-12-26 NOTE — Telephone Encounter (Signed)
Called to schedule stroke f/u, left message for pt to return call. AW 

## 2016-12-29 ENCOUNTER — Telehealth: Payer: Self-pay | Admitting: Nurse Practitioner

## 2016-12-29 ENCOUNTER — Ambulatory Visit (INDEPENDENT_AMBULATORY_CARE_PROVIDER_SITE_OTHER): Payer: BLUE CROSS/BLUE SHIELD | Admitting: *Deleted

## 2016-12-29 DIAGNOSIS — I639 Cerebral infarction, unspecified: Secondary | ICD-10-CM | POA: Diagnosis not present

## 2016-12-29 NOTE — Telephone Encounter (Signed)
I called and spoke to wife, and relayed that she contact the pcp for taking care of both of these issues.  She verbalized understanding.

## 2016-12-29 NOTE — Telephone Encounter (Signed)
Pt wife has called and states that pt is experiencing insomnia and some depression.  Pt wife would like to know if something could be called in for both of these matters.  Please call pt wife on mobile#

## 2016-12-30 NOTE — Progress Notes (Signed)
[facility] Summary Report / Loop Recorder 

## 2017-01-02 ENCOUNTER — Other Ambulatory Visit (HOSPITAL_COMMUNITY): Payer: Self-pay | Admitting: Interventional Radiology

## 2017-01-02 DIAGNOSIS — I639 Cerebral infarction, unspecified: Secondary | ICD-10-CM

## 2017-01-03 LAB — CUP PACEART REMOTE DEVICE CHECK
Implantable Pulse Generator Implant Date: 20180525
MDC IDC SESS DTM: 20180823220937

## 2017-01-03 NOTE — Progress Notes (Signed)
Carelink summary report received. Battery status OK. Normal device function. No new symptom episodes, tachy episodes, brady, or pause episodes. No new AF episodes. Monthly summary reports and ROV/PRN 

## 2017-01-11 ENCOUNTER — Other Ambulatory Visit: Payer: Self-pay

## 2017-01-12 ENCOUNTER — Other Ambulatory Visit: Payer: Self-pay

## 2017-01-12 NOTE — Patient Outreach (Signed)
Telephone outreach to patient to obtain mRs was successfully completed. mRs= 1. 

## 2017-01-30 ENCOUNTER — Ambulatory Visit (INDEPENDENT_AMBULATORY_CARE_PROVIDER_SITE_OTHER): Payer: BLUE CROSS/BLUE SHIELD | Admitting: *Deleted

## 2017-01-30 DIAGNOSIS — I639 Cerebral infarction, unspecified: Secondary | ICD-10-CM

## 2017-01-31 LAB — CUP PACEART REMOTE DEVICE CHECK
Implantable Pulse Generator Implant Date: 20180525
MDC IDC SESS DTM: 20180922221432

## 2017-01-31 NOTE — Progress Notes (Signed)
[facility] Summary Report / Loop Recorder 

## 2017-02-06 ENCOUNTER — Telehealth: Payer: Self-pay | Admitting: Internal Medicine

## 2017-02-06 ENCOUNTER — Ambulatory Visit (HOSPITAL_COMMUNITY): Payer: BLUE CROSS/BLUE SHIELD

## 2017-02-06 NOTE — Telephone Encounter (Signed)
[email or web address]  No episodes recorded.

## 2017-02-06 NOTE — Telephone Encounter (Signed)
Baxter Hire( Daughter) is calling because Mr. Cassaday had another stroke on Sunday and is wanting to know if his loop recorder was recorded .Marland Kitchen Please call

## 2017-02-08 NOTE — Telephone Encounter (Signed)
VM message says "Johnny Sullivan". No DPR in chart to call other numbers to inform.

## 2017-02-27 ENCOUNTER — Ambulatory Visit (INDEPENDENT_AMBULATORY_CARE_PROVIDER_SITE_OTHER): Payer: BLUE CROSS/BLUE SHIELD | Admitting: *Deleted

## 2017-02-27 DIAGNOSIS — I639 Cerebral infarction, unspecified: Secondary | ICD-10-CM

## 2017-03-01 LAB — CUP PACEART REMOTE DEVICE CHECK
Date Time Interrogation Session: 20181022223949
MDC IDC PG IMPLANT DT: 20180525

## 2017-03-01 NOTE — Progress Notes (Signed)
[facility] Summary Report / Loop Recorder 

## 2017-03-06 ENCOUNTER — Encounter: Payer: Self-pay | Admitting: Neurology

## 2017-03-06 ENCOUNTER — Ambulatory Visit (INDEPENDENT_AMBULATORY_CARE_PROVIDER_SITE_OTHER): Payer: BLUE CROSS/BLUE SHIELD | Admitting: Neurology

## 2017-03-06 VITALS — BP 137/76 | HR 75 | Wt 214.9 lb

## 2017-03-06 DIAGNOSIS — I639 Cerebral infarction, unspecified: Secondary | ICD-10-CM

## 2017-03-06 DIAGNOSIS — R4701 Aphasia: Secondary | ICD-10-CM | POA: Diagnosis not present

## 2017-03-06 DIAGNOSIS — Z951 Presence of aortocoronary bypass graft: Secondary | ICD-10-CM | POA: Diagnosis not present

## 2017-03-06 DIAGNOSIS — E785 Hyperlipidemia, unspecified: Secondary | ICD-10-CM

## 2017-03-06 DIAGNOSIS — I10 Essential (primary) hypertension: Secondary | ICD-10-CM

## 2017-03-06 MED ORDER — APIXABAN 5 MG PO TABS: 5.0000 mg | ORAL_TABLET | Freq: Two times a day (BID) | ORAL | 3 refills | Status: DC

## 2017-03-06 NOTE — Progress Notes (Signed)
STROKE NEUROLOGY FOLLOW UP NOTE  NAME: Johnny Sullivan DOB: [birth year]  REASON FOR VISIT: stroke follow up HISTORY FROM: wife and daughter and chart  Today we had the pleasure of seeing Johnny Sullivan in follow-up at our Neurology Clinic. Pt was accompanied by wife and daughter.   History Summary JohnnyLaquon Wilkinsonis a 61 y.o.malewith history of CAD with previous MI s/p CABG in 2014, hypertension, ureteral stricture and GERDadmitted on 09/26/16 for aphasia and right hemiplegia.The patient received IV TPA. CT head and neck showed acute left ICA and proximal left M1 occlusion. Cerebral angio showed left ICA, MCA and ACA occlusion s/p mechanical thrombectomy with TICI3 reperfusion.  CTA head after showed evolving acute left MCA territory infarct with superimposed small hemorrhagic transformation.  MRI showed acute large left MCA territory infarct with petechial hemorrhage.  MRA head showed left brain lux reperfusion.  TTE EF 50-55%, dyskinesis of apical myocardium, consistent with infarction in the distribution of the left anterior descending coronary artery.  No visible thrombus, however there is a very slow, swirling flow in the dyskinetic apex. TEE showed EF 50-55%, smoke at the apex but no thrombus visualized.  No LAA thrombus.  No PFO.  No DVT LE venous Doppler.  Loop recorder placed.  He was put on aspirin 325 and continue the Lipitor 80 for stroke prevention.  He had urethral stricture s/p suprapubic catheter and continue follow-up with [name] as outpatient.  Follow up 11/30/16 (CM) - 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 He is currently on aspirin 325 daily with minimal bruising and no bleeding. He has not had recurrent stroke or TIA symptoms. He remains on Lipitor without myalgias. Occupational therapy has stopped however he is continuing to get speech therapy for global aphasia. Patient lives in [place] and sees his cardiologist  there. Blood pressure in the office today 140/86 Loop recorder so far has not shown any evidence of atrial fibrillation. Patient cannot go back to work until his speech improves. He returns for reevaluation.  Admission 02/05/17 - acute onset of right sided weakness and right sided facial drooping, expressive aphasia admitted to [facility]. CT cerebral perfusion showed prolonged transite time in the left temporoparietal lobe lobe surrounding the previous infarct. CTA head and neck showed occulosive clot reported in the left ICA terminus. Mechanical embolectomy was done with TICI3 flow. MRI showed Foci of acute ischemic stroke seen within the left basal ganglia, internal capsule and overlying frontal cortex in the distribution of the left MCA and small focus of acute parenchymal hemorrhage in the left frontal lobe as seen on prior CT. TTE EF 55-60%, again showing hypokinesis of the apex and apical wall segments.  And TEE showed echodensity noted in the aortic root adjacent to the noncoronary cusp of the right aortic wall, could presenting as calcified plaque, no thrombus seen. Antiphospholipid antibody negative. Patient condition improved closeto his baseline and he was discharged with ASA 81mg .   Interval History During the interval time, the patient has been doing well.  He continues to have expressive aphasia which has been the same since first admission.  Continue to have right hand dexterity difficulties but otherwise no physical weakness.  As per wife, patient had hematology follow-up and ruled out hypercoagulable state.  Loop recorder did not show A. fib.  BP today 137/76.  Still on aspirin 81 and Lipitor 80.  Very limited alcohol consumption since first admission, now still on the B1/FA/multivitamin.  REVIEW OF SYSTEMS: Full  14 system review of systems performed and notable only for those listed below and in HPI above, all others are negative:  Constitutional:   Cardiovascular:    Ear/Nose/Throat:   Skin:  Eyes:   Respiratory:   Gastroitestinal:   Genitourinary:  Hematology/Lymphatic:   Endocrine:  Musculoskeletal:   Allergy/Immunology:   Neurological:   Psychiatric:  Sleep:   The following represents the patient's updated allergies and side effects list: Allergies  Allergen Reactions  . Alteplase Swelling and Other (See Comments)    Angioedema   . 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  Neurologic Examination  A problem focused neurological exam (12 or more points of the single system neurologic examination, vital signs counts as 1 point, cranial nerves count for 8 points) was performed.  Blood pressure 137/76, pulse 75, weight 214 lb 14.4 oz (97.5 kg).  General - Well nourished, well developed, in no apparent distress.  Ophthalmologic - Fundi not visualized due to noncooperation.  Cardiovascular - Regular rate and rhythm with no murmur.  Mental Status -  Awake, alert, difficulty with orientation question due to aphasia.  Still has significant expressive aphasia, able to follow simple commands, however still have difficulty with two-step commands.  Anomia, not able to repeat.  Cranial Nerves II - XII - II - Visual field intact OU. III, IV, VI - Extraocular movements intact. V - Facial sensation intact bilaterally. VII - Facial movement intact bilaterally. VIII - Hearing & vestibular intact bilaterally. X - Palate elevates symmetrically. XI - Chin turning & shoulder shrug intact bilaterally. XII - Tongue protrusion intact.  Motor Strength - The patient's strength was normal in all extremities except right hand mild dexterity difficulties and pronator drift was absent.  Bulk was normal and fasciculations were absent.   Motor Tone - Muscle tone was assessed at the neck and appendages and was  normal.  Reflexes - The patient's reflexes were 1+ in all extremities and he had no pathological reflexes.  Sensory - Light touch, temperature/pinprick, vibration and proprioception, and Romberg testing were assessed and were normal.    Coordination - The patient had normal movements in the hands and feet with no ataxia or dysmetria.  Tremor was absent.  Gait and Station - The patient's transfers, posture, gait, station, and turns were observed as normal.   Functional score  mRS = 2   0 - No symptoms.   1 - No significant disability. Able to carry out all usual activities, despite some symptoms.   2 - Slight disability. Able to look after own affairs without assistance, but unable to carry out all previous activities.   3 - Moderate disability. Requires some help, but able to walk unassisted.   4 - Moderately severe disability. Unable to attend to own bodily needs without assistance, and unable to walk unassisted.   5 - Severe disability. Requires constant nursing care and attention, bedridden, incontinent.   6 - Dead.   NIH Stroke Scale   Level Of Consciousness 0=Alert; keenly responsive 1=Not alert, but arousable by minor stimulation 2=Not alert, requires repeated stimulation 3=Responds only with reflex movements 0  LOC Questions to Month and Age 27=Answers both questions correctly 1=Answers one question correctly 2=Answers neither question correctly 2  LOC Commands      -Open/Close eyes     -Open/close grip 0=Performs both tasks correctly  1=Performs one task correctly 2=Performs neighter task correctly 0  Best Gaze 0=Normal 1=Partial gaze palsy 2=Forced deviation, or total gaze paresis 0  Visual 0=No visual loss 1=Partial hemianopia 2=Complete hemianopia 3=Bilateral hemianopia (blind including cortical blindness) 0  Facial Palsy 0=Normal symmetrical movement 1=Minor paralysis (asymmetry) 2=Partial paralysis (lower face) 3=Complete paralysis (upper and lower  face) 0  Motor  0=No drift, limb holds posture for full 10 seconds 1=Drift, limb holds posture, no drift to bed 2=Some antigravity effort, cannot maintain posture, drifts to bed 3=No effort against gravity, limb falls 4=No movement Right Arm 0     Leg 0    Left Arm 0     Leg 0  Limb Ataxia 0=Absent 1=Present in one limb 2=Present in two limbs 0  Sensory 0=Normal 1=Mild to moderate sensory loss 2=Severe to total sensory loss 0  Best Language 0=No aphasia, normal 1=Mild to moderate aphasia 2=Mute, global aphasia 3=Mute, global aphasia 2  Dysarthria 0=Normal 1=Mild to moderate 2=Severe, unintelligible or mute/anarthric 2  Extinction/Neglect 0=No abnormality 1=Extinction to bilateral simultaneous stimulation 2=Profound neglect 0  Total   6     Data reviewed: I personally reviewed the images and agree with the radiology interpretations.  Ct Angio Head W Or Wo Contrast Ct Angio Neck W And/or Wo Contrast 09/26/2016 CTA NECK:  Acute LEFT internal carotid artery occlusion.Atherosclerosis without hemodynamically significant stenosis. Multilevel severe neural foraminal narrowing. CTA HEAD:  Occluded LEFT ICA with reconstitution LEFT carotid terminus. Occluded proximal LEFT M1segment with thready collateralization. Diminutive RIGHT anterior cerebral artery favoring normal variant.   Ct Head Wo Contrast 09/26/2016 1. Evolving acute left MCA territory infarct as above. Superimposed scattered serpiginous hyperdensity most likely reflects contrast staining from recent endovascular revascularization procedure. Superimposed hemorrhage not entirely excluded.Attention at follow-up recommended.  2. Otherwise stable appearance of the brain.   Ct Head Wo Contrast 09/26/2016 1. Emergent large vessel occlusion affecting the LEFT ICA terminus and proximal LEFT MCA.  2. ASPECTS is 8, early hypoattenuation of the LEFT insula and LEFT anterolateral temporal cortex.   Cerebral  angiogram S/P Lt common carotid arteriogram followed by endovascular revascularization of occluded LT ICA terminus,Lt MCA and LtACAwith x 2 passes with Solitaire FR 40 MM retrieval device achieving a TICI 3 reperfusion. Groin puncture to TICI 2b 26 mins, and to TICI 3 .  Ct Head Wo Contrast  09/28/2016 IMPRESSION: 1. Continued expected evolution of left MCA infarct without new acute finding. 2. Trace residual hyperdensity in this area is favored to indicate contrast staining. Continued attention on follow-up is recommended to exclude hemorrhage.   Mri and Mra Brain Wo Contrast 09/27/2016 IMPRESSION: MRI HEAD: Acute large LEFT MCA territory infarct with petechial hemorrhage. Mild chronic small vessel ischemic disease. MRA HEAD: Successful re- vascularization of LEFT internal carotid artery. Severe stenosis LEFT M2 vs. M3 origin, possible residual thromboembolism.   TTE - Left ventricle: The cavity size was normal. There was mild concentric hypertrophy. Systolic function was normal. The estimated ejection fraction was in the range of 50% to 55%. Dyskinesis of the apical myocardium; consistent with infarction in the distribution of the left anterior descending coronary artery. Doppler parameters are consistent with abnormal left ventricular relaxation (grade 1 diastolic dysfunction). Doppler parameters are consistent with elevated mean left atrial filling pressure. Acoustic contrast opacification revealed no evidence ofthrombus. Although there is no visible thrombus, there is very slow, swirling flow in the dyskinetic apex. - Aortic valve: Noncoronary cusp mobility was severely restricted. There was mild regurgitation. -  Mitral valve: Calcified annulus. - Left atrium: The atrium was mildly dilated.  LE - There is no DVT or SVT noted in the bilateral lower extremities.   TEE  09/30/2016 Please see echo section for full report. Normal LV size with  mild LV hypertrophy. EF 50-55% with apical dyskinesis. Smoke at the apex but no thrombus visualized. Normal RV size and systolic function. Mild left atrial enlargement with no LA appendage thrombus. Normal right atrium. No significant TR. Trileaflet aortic valve with moderate calcification. Mild aortic stenosis, mild aortic insufficiency. Mild mitral regurgitation. No PFO/ASD, negative bubble study. Normal caliber thoracic aorta with grade III plaque in the descending thoracic aorta.  CTA head and neck 02/05/17 1. There is evidence of a old left MCA territory infarct with evidence of thrombosis of the M1 segment of the left middle cerebral artery, the thrombosis may be acute based on the hyperdensity within the proximal middle cerebral artery. Follow-up suggested.  CT perfusion 02/05/17 IMPRESSION: 1.The CT cerebral perfusion evaluation demonstrates prolonged mean transit time in the left temporoparietal lobe lobe surroundingthe previous infarct with moderately reduced cerebral blood flow, consistent with a large ischemic penumbra. Patient would benefit from revascularization. Follow-up suggested.  DSA 02/05/17 Left ICA terminus occlusion, now status post mechanical embolectomy x 2. No residual clot post procedure, now with TICI 3 flow.  CT head without contrast 02/06/17 1. Interval development of intraparenchymal hemorrhage (1st paragraph)--potentially hemorrhagic conversion of a small underlying currently obscured (by the aforementioned hemorrhage) recent infarction. 2. Late subacute to chronic infarctions (2nd and 3rd paragraphs). 3. Background senescent change (4th paragraph)--within the typical range for age.   TEE 02/06/17  1. Overall left ventricular ejection fraction is estimated at 55 to 60%.  2. Normal global left ventricular systolic function.  3. Normal left ventricular diastolic filling.  4. Normal right ventricular size and systolic function.  5. Mild aortic  regurgitation.  6. There is moderate aortic valve sclerosis.  7. There is an echodensity noted in the aortic root adjacent to the noncoronary cusp of the aortic valve, also visualized at 12 o'clock position in short axis view. This could represent calcified plaque. There is no evidence of vegetation or mass attached to the aortic valve.  8. Small plaque involving the descending aorta.  9. Negative bubble study. 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 11. No evidence of any gross valvular vegetations. 12. No intracardiac thrombi, mass or vegetations.  TTE 02/06/17  1. Overall left ventricular ejection fraction is estimated at 55 to 60%.  2. Normal global left ventricular systolic function.  3. (Grade 1) Mildly abnormal left ventricular diastolic filling.  4. Focal calcification with slightly mobile element around the non coronary cusp. Consider TEE to fully characterize.  5. Hypokinesis of the apex and apical wall segments suggesting distal LAD territory ischemic heart disease.  MRI 02/06/17 1.Foci of acute ischemic stroke seen within the left basal ganglia, internal capsule and overlying frontal cortex in the distribution of the left MCA. 2.Chronic encephalomalacia in the left MCA distribution consistent with remote vascular insult. 3.Small focus of acute parenchymal hemorrhage in the left frontal lobe as seen on prior CT. 4.Chronic hemosiderin deposition in the old left MCA infarct bed. 5.Mild to moderate periventricular deep white changes of chronic microvascular disease.  Component     Latest Ref Rng & Units 09/27/2016 09/28/2016  Cholesterol     0 - 200 mg/dL 161   Triglycerides     <150 mg/dL 83   HDL Cholesterol     >  40 mg/dL 33 (L)   Total CHOL/HDL Ratio     RATIO 3.3   VLDL     0 - 40 mg/dL 17   LDL (calc)     0 - 99 mg/dL 58   Hemoglobin W0J     4.8 - 5.6 % 6.3 (H)   Mean Plasma Glucose     mg/dL 811   HIV Screen 4th Generation wRfx     Non Reactive  Non Reactive    02/08/17 Lupus anticoagulant negative Beta-2 glycoprotein antibodies -negative LDL 61  Assessment: As you may recall, he is a 61 y.o. Caucasian male with PMH of CAD with previous MI s/p CABG in 2014, hypertension, ureteral stricture and GERDadmitted on 09/26/16 for aphasia and right hemiplegia, received IV TPA. CT head and neck showed acute left ICA and proximal left M1 occlusion. Cerebral angio showed left ICA, MCA and ACA occlusion s/p mechanical thrombectomy with TICI3 reperfusion.  CTA head after showed evolving acute left MCA territory infarct with superimposed small hemorrhagic transformation.  MRI showed acute large left MCA territory infarct with petechial hemorrhage.  MRA head showed left brain lux reperfusion.  TTE EF 50-55%, dyskinesis of apical myocardium, consistent with infarction in the distribution of the left anterior descending coronary artery.  No visible thrombus, however there is a very slow, swirling flow in the dyskinetic apex. TEE showed EF 50-55%, smoke at the apex but no thrombus visualized.  No LAA thrombus.  No PFO.  No DVT LE venous Doppler.  Loop recorder placed.  He was put on aspirin 325 and continue the Lipitor 80 for stroke prevention.  Admitted on 02/05/17 at [facility] for acute onset of right sided weakness. CTA head and neck showed occulosive clot reported in the left ICA terminus. Mechanical embolectomy was done with TICI3 flow. MRI showed Foci of acute ischemic stroke seen surrounding previous large MCA infarcts. TTE EF 55-60%, again showing hypokinesis of the apex and apical wall segments.  And TEE showed echodensity noted in the aortic root adjacent to the noncoronary cusp of the right aortic wall, could presenting as calcified plaque, no thrombus seen. Antiphospholipid antibody negative. Patient condition improved closeto his baseline and he was discharged with ASA 81mg . Although loop recorder no afib, his two strokes are consistent with cardioembolic  source, concerning for hypokinesis at apx. Would like to start [facility] for him for stroke prevention.   Plan:  - will initiate eliquis 5mg  twice a day.  - OK to continue ASA 81mg  daily. Follow up with cardiology to see if ASA 81mg  is needed from cardiology standpoint - continue lipitor 80mg  for stroke prevention - continue speech therapy to improve aphasia.  - Follow up with your primary care physician for stroke risk factor modification. Recommend maintain blood pressure goal <140/90, diabetes with hemoglobin A1c goal below 7.0% and lipids with LDL cholesterol goal below 70 mg/dL.  - follow up with cardiology - bleeding precautions - check BP at home and record. - follow up in 3 months.   No orders of the defined types were placed in this encounter.   Meds ordered this encounter  Medications  . Acetaminophen (MAPAP) 500 MG coapsule    Sig: Take by mouth.  . DISCONTD: aspirin 81 MG chewable tablet    Sig: Chew by mouth.  . DISCONTD: sertraline (ZOLOFT) 50 MG tablet    Sig: Take 50 mg by mouth daily.    Refill:  12  . DISCONTD: atorvastatin (LIPITOR) 20 MG tablet  Sig: Take by mouth.  . DISCONTD: metoprolol succinate (TOPROL-XL) 25 MG 24 hr tablet    Sig: Take by mouth.  . DISCONTD: folic acid (FOLVITE) 400 MCG tablet    Sig: Take by mouth.  . zolpidem (AMBIEN) 5 MG tablet    Sig: Take by mouth at bedtime as needed.   Marland Kitchen. DISCONTD: clopidogrel (PLAVIX) 75 MG tablet    Sig: Take by mouth.  Marland Kitchen. apixaban (ELIQUIS) 5 MG TABS tablet    Sig: Take 1 tablet (5 mg total) by mouth 2 (two) times daily.    Dispense:  60 tablet    Refill:  3    Patient Instructions  - will initiate eliquis 5mg  twice a day. Let us know if you have difficulty refill in one month.  - OK to continue ASA 81mg  daily. Follow up with cardiology to see if ASA 81mg  is needed from cardiology standpoint - continue lipitor 80mg  for stroke prevention - continue speech therapy to improve aphasia.  - Follow up with your  primary care physician for stroke risk factor modification. Recommend maintain blood pressure goal <140/90, diabetes with hemoglobin A1c goal below 7.0% and lipids with LDL cholesterol goal below 70 mg/dL.  - follow up with cardiology - no more than one beer a day. Continue multivitamin.  - bleeding precautions - check BP at home and record. - follow up in 3 months.    Marvel PlanJindong Mario Voong, MD PhD [facility] [address], [address] [place], [place] [address] [phone]

## 2017-03-06 NOTE — Patient Instructions (Addendum)
-   will initiate eliquis 5mg  twice a day. Let us know if you have difficulty refill in one month.  - OK to continue ASA 81mg  daily. Follow up with cardiology to see if ASA 81mg  is needed from cardiology standpoint - continue lipitor 80mg  for stroke prevention - continue speech therapy to improve aphasia.  - Follow up with your primary care physician for stroke risk factor modification. Recommend maintain blood pressure goal <140/90, diabetes with hemoglobin A1c goal below 7.0% and lipids with LDL cholesterol goal below 70 mg/dL.  - follow up with cardiology - no more than one beer a day. Continue multivitamin.  - bleeding precautions - check BP at home and record. - follow up in 3 months.

## 2017-03-07 ENCOUNTER — Telehealth: Payer: Self-pay

## 2017-03-07 ENCOUNTER — Encounter: Payer: Self-pay | Admitting: Neurology

## 2017-03-07 NOTE — Telephone Encounter (Signed)
PA done for Eliquis medication for patient. Medication was denied. Dr. Roda ShuttersXu stated appeal will be done.

## 2017-03-07 NOTE — Telephone Encounter (Signed)
Rn call [facility]. They stated patient did submit the 30 day free coupon to get medication. Patient did pay 0 dollars on 03/06/2017. PT will only get a. The medication is still requiring a PA.

## 2017-03-08 NOTE — Telephone Encounter (Signed)
Appeal letter done by Dr. Roda ShuttersXu. Letter and last office note fax to  Attention: Girtha RmMIchael M MCall, Case ID number is [id number]. Everything was fax to [phone]. Fax was confirmed and noted receive.

## 2017-03-13 NOTE — Telephone Encounter (Signed)
Appeal letter fax to Attention:Michael  MCCall at [phone].  Letter,and office notes fax and receive on 03/08/2017.

## 2017-03-29 ENCOUNTER — Ambulatory Visit (INDEPENDENT_AMBULATORY_CARE_PROVIDER_SITE_OTHER): Payer: BLUE CROSS/BLUE SHIELD | Admitting: *Deleted

## 2017-03-29 DIAGNOSIS — I639 Cerebral infarction, unspecified: Secondary | ICD-10-CM

## 2017-04-03 ENCOUNTER — Other Ambulatory Visit: Payer: Self-pay

## 2017-04-03 DIAGNOSIS — I639 Cerebral infarction, unspecified: Secondary | ICD-10-CM

## 2017-04-03 MED ORDER — APIXABAN 5 MG PO TABS: 5.0000 mg | ORAL_TABLET | Freq: Two times a day (BID) | ORAL | 3 refills | Status: DC

## 2017-04-03 NOTE — Telephone Encounter (Signed)
Rn call [phone] about the appeal process. This number was on patients insurance card listed under fund group. Rn spoke with Johnny Sullivan rep with pts insurance company in the appeal process. Rn stated appeal was fax 03/07/2017 no letter or fax was sent to the MD or the patient per the wife. Johnny Sullivan stated the appeal was approve on 03/13/2017. It was approve from 03/13/2017 to 04/10/2098. The per cert number is [id number]. Johnny Sullivan stated a letter should of been mail to patient and Dr. Roda Sullivan. Rn stated it was never receive.   Rn was put on hold while he call the patients pharmacy. He stated [facility] stated the Eliquis did go through and the cost is 88.25 dollars. The pt can get medication at 90 day supply. RN verbalized understanding, and will call wife.

## 2017-04-03 NOTE — Progress Notes (Signed)
[facility] Summary Report / Loop Recorder 

## 2017-04-03 NOTE — Telephone Encounter (Signed)
Rn call patients wife that the appeal was approve. Rn stated a letter was never sent to the office for Dr. Roda ShuttersXu. The wife reported her husband never receive a letter. Rn stated the will be sent to the pharmacy.Pts wife verbalized understanding.

## 2017-04-03 NOTE — Telephone Encounter (Signed)
Patient's wife calling to follow up on an appeal for apixaban (ELIQUIS) 5 MG TABS tablet. He is going out of the country this Saturday and needs to have it to take with him on trip.

## 2017-04-03 NOTE — Telephone Encounter (Signed)
Good job, Tax adviserkatrina. Thanks a lot for your effort.  Marvel PlanJindong Vietta Bonifield, MD PhD Stroke Neurology 04/03/2017 4:46 PM

## 2017-04-06 ENCOUNTER — Telehealth (HOSPITAL_COMMUNITY): Payer: Self-pay

## 2017-04-06 NOTE — Telephone Encounter (Signed)
Called to reschedule f/u, left message for pt's wife to return call. AW 

## 2017-04-12 ENCOUNTER — Telehealth (HOSPITAL_COMMUNITY): Payer: Self-pay

## 2017-04-12 NOTE — Telephone Encounter (Signed)
Left message for pt to return call. AW 

## 2017-04-13 LAB — CUP PACEART REMOTE DEVICE CHECK
Date Time Interrogation Session: 20181121224259
MDC IDC PG IMPLANT DT: 20180525

## 2017-04-28 ENCOUNTER — Ambulatory Visit (INDEPENDENT_AMBULATORY_CARE_PROVIDER_SITE_OTHER): Payer: BLUE CROSS/BLUE SHIELD | Admitting: *Deleted

## 2017-04-28 DIAGNOSIS — I639 Cerebral infarction, unspecified: Secondary | ICD-10-CM

## 2017-05-03 NOTE — Progress Notes (Signed)
[facility] Summary Report / Loop Recorder 

## 2017-05-11 LAB — CUP PACEART REMOTE DEVICE CHECK
Implantable Pulse Generator Implant Date: 20180525
MDC IDC SESS DTM: 20181221230900

## 2017-05-29 ENCOUNTER — Ambulatory Visit (INDEPENDENT_AMBULATORY_CARE_PROVIDER_SITE_OTHER): Payer: BLUE CROSS/BLUE SHIELD | Admitting: *Deleted

## 2017-05-29 DIAGNOSIS — I639 Cerebral infarction, unspecified: Secondary | ICD-10-CM | POA: Diagnosis not present

## 2017-05-29 NOTE — Progress Notes (Signed)
[facility] Summary Report / Loop Recorder 

## 2017-06-02 ENCOUNTER — Ambulatory Visit: Payer: BLUE CROSS/BLUE SHIELD | Admitting: Nurse Practitioner

## 2017-06-05 LAB — CUP PACEART REMOTE DEVICE CHECK
Date Time Interrogation Session: 20190120230956
MDC IDC PG IMPLANT DT: 20180525

## 2017-06-05 NOTE — Progress Notes (Signed)
[facility]  PATIENT: Johnny Sullivan DOB: Aug 22, 1955   REASON FOR VISIT: Follow-up for stroke HISTORY FROM: Patient and wife    HISTORY OF PRESENT ILLNESS:Johnny SullivanJohnny Wilkinsonis a 62 y.o.malewith history of CAD with previous MI s/p CABG in 2014, hypertension, ureteral stricture and GERDadmitted on 09/26/16 for aphasia and right hemiplegia.The patient received IV TPA. CT head and neck showed acute left ICA and proximal left M1 occlusion. Cerebral angio showed left ICA, MCA and ACA occlusion s/p mechanical thrombectomy with TICI3 reperfusion.  CTA head after showed evolving acute left MCA territory infarct with superimposed small hemorrhagic transformation.  MRI showed acute large left MCA territory infarct with petechial hemorrhage.  MRA head showed left brain lux reperfusion.  TTE EF 50-55%, dyskinesis of apical myocardium, consistent with infarction in the distribution of the left anterior descending coronary artery.  No visible thrombus, however there is a very slow, swirling flow in the dyskinetic apex. TEE showed EF 50-55%, smoke at the apex but no thrombus visualized.  No LAA thrombus.  No PFO.  No DVT LE venous Doppler.  Loop recorder placed.  He was put on aspirin 325 and continue the Lipitor 80 for stroke prevention.  He had urethral stricture s/p suprapubic catheter and continue follow-up with [name] as outpatient.  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 He is currently on aspirin 325 daily with minimal bruising and no bleeding.He has not had recurrent stroke or TIA symptoms. He remains on Lipitor without myalgias. Occupational therapy has stopped however he is continuing to get speech therapy for global aphasia. Patient lives in [place] and sees his cardiologist there.Blood pressure in the office today 140/86Loop recorder so far has not shown any evidence of atrial fibrillation. Patient  cannot go back to work until his speech improves.He returns for reevaluation.  Admission 02/05/17 DR. XU- acute onset of right sided weakness and right sided facial drooping, expressive aphasia admitted to [facility]. CT cerebral perfusion showed prolonged transite time in the left temporoparietal lobe lobe surrounding the previous infarct. CTA head and neck showed occulosive clot reported in the left ICA terminus. Mechanical embolectomy was done with TICI3 flow. MRI showed Foci of acute ischemic stroke seen within the left basal ganglia, internal capsule and overlying frontal cortex in the distribution of the left MCA and small focus of acute parenchymal hemorrhage in the left frontal lobe as seen on prior CT. TTE EF 55-60%, again showing hypokinesis of the apex and apical wall segments.  And TEE showed echodensity noted in the aortic root adjacent to the noncoronary cusp of the right aortic wall, could presenting as calcified plaque, no thrombus seen. Antiphospholipid antibody negative. Patient condition improved closeto his baseline and he was discharged with ASA 81mg .   Interval History10/29/18 [name] During the interval time, the patient has been doing well.  He continues to have expressive aphasia which has been the same since first admission.  Continue to have right hand dexterity difficulties but otherwise no physical weakness.  As per wife, patient had hematology follow-up and ruled out hypercoagulable state.  Loop recorder did not show A. fib.  BP today 137/76.  Still on aspirin 81 and Lipitor 80.  Very limited alcohol consumption since first admission, now still on the B1/FA/multivitamin. UPDATE 1/29/2019CM Johnny Sullivan, 62 year old male returns for follow-up with history of stroke x2.  He continues to have expressive aphasia which is unchanged from his first admission.  He  continues to have some right hand dexterity difficulties but otherwise no physical weakness.  His  cardiologist is [name] in [place].  He remains on aspirin and Eliquis for secondary stroke prevention and cardiac prevention.  He has not had further stroke or TIA symptoms since last seen.  He has minimal bruising and no bleeding.  Blood pressure in the office today 115/69.  He remains on Lipitor without myalgias.  He continues to get speech therapy 3 times a week.  Appetite is good.  He also walks for exercise, denies any falls.  He has 1 beer once or twice a week since his admission.  He has stopped his thiamine and folic acid.  He returns for reevaluation  REVIEW OF SYSTEMS: Full 14 system review of systems performed and notable only for those listed, all others are neg:  Constitutional: neg  Cardiovascular: neg Ear/Nose/Throat: neg  Skin: neg Eyes: neg Respiratory: neg Gastroitestinal: neg  Hematology/Lymphatic: neg  Endocrine: neg Musculoskeletal:neg Allergy/Immunology: neg Neurological: Speech difficulty Psychiatric: neg Sleep : neg   ALLERGIES: Allergies  Allergen Reactions  . Alteplase Swelling and Other (See Comments)    Angioedema   . 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 Acetaminophen (MAPAP) 500 MG coapsule Take by mouth.    Marland Kitchen amoxicillin (AMOXIL) 500 MG capsule Take 2,000 mg by mouth See admin instructions. ONE HOUR PRIOR TO DENTAL APPOINTMENT  0  . apixaban (ELIQUIS) 5 MG TABS tablet Take 1 tablet (5 mg total) by mouth 2 (two) times daily. 180 tablet 3  . aspirin EC 81 MG tablet Take 81 mg by mouth daily.    Marland Kitchen atorvastatin (LIPITOR) 80 MG tablet Take 80 mg by mouth daily at 6 PM.   3  . metoprolol tartrate (LOPRESSOR) 25 MG tablet 25 mg 2 (two) times daily.  6  . Multiple Vitamin (MULTIVITAMIN) capsule Take 1 capsule by mouth daily. For men > 50    . sertraline (ZOLOFT) 50 MG tablet Take 50 mg by mouth daily.   12  . zolpidem (AMBIEN) 5 MG tablet Take by mouth at bedtime as needed.     Marland Kitchen aspirin 325 MG tablet Take 1 tablet (325 mg total) by mouth daily. (Patient taking differently: Take 81 mg by mouth daily. )    . folic acid (FOLVITE) 1 MG tablet Take 1 tablet (1 mg total) by mouth daily. (Patient not taking: Reported on 06/06/2017)    . thiamine 100 MG tablet Take 1 tablet (100 mg total) by mouth daily. (Patient not taking: Reported on 06/06/2017)     No facility-administered medications prior to visit.     PAST MEDICAL HISTORY: Past Medical History:  Diagnosis Date  . Arthritis    Osteoarthritis- right hip  . Coronary artery disease   . GERD (gastroesophageal reflux disease)   . Hypertension   . Myocardial infarction (HCC)    " mild"-Dr. Abbey Chatters follows -gives clearance.  . Stroke (HCC)   . Urethral stricture    'uses self [place] caths weekly" to correct.    PAST SURGICAL HISTORY: Past Surgical History:  Procedure Laterality Date  . BACK SURGERY     lumbar discectomy  . CARDIAC CATHETERIZATION    . CORONARY ARTERY BYPASS GRAFT     09-20-2012 x3 -[place]  . IR PERCUTANEOUS ART THROMBECTOMY/INFUSION INTRACRANIAL INC DIAG ANGIO  09/26/2016  .  LOOP RECORDER INSERTION N/A 09/30/2016   Procedure: Loop Recorder Insertion;  Surgeon: Hillis RangeAllred, James, MD;  Location: [facility] INVASIVE CV LAB;  Service: Cardiovascular;  Laterality: N/A;  . RADIOLOGY WITH ANESTHESIA N/A 09/26/2016   Procedure: RADIOLOGY WITH ANESTHESIA;  Surgeon: Julieanne Cottoneveshwar, Sanjeev, MD;  Location: [facility] OR;  Service: Radiology;  Laterality: N/A;  . TEE WITHOUT CARDIOVERSION N/A 09/30/2016   Procedure: TRANSESOPHAGEAL ECHOCARDIOGRAM (TEE);  Surgeon: Laurey MoraleMcLean, Dalton S, MD;  [facility];  Service: Cardiovascular;  Laterality: N/A;  . TOTAL HIP ARTHROPLASTY Right 04/26/2016   Procedure: RIGHT TOTAL HIP ARTHROPLASTY ANTERIOR APPROACH;  Surgeon: Durene RomansMatthew Olin, MD;  Location: [facility] ORS;  Service: Orthopedics;   Laterality: Right;  Marland Kitchen. VASECTOMY    . WRIST SURGERY     "implant of plastic bone"-mild ROM limits    FAMILY HISTORY: Family History  Problem Relation Age of Onset  . Alzheimer's disease Mother     SOCIAL HISTORY: Social History   Socioeconomic History  . Marital status: Married    Spouse name: Clydie BraunKaren  . Number of children: 4  . Years of education: 5016  . Highest education level: Not on file  Social Needs  . Financial resource strain: Not on file  . Food insecurity - worry: Not on file  . Food insecurity - inability: Not on file  . Transportation needs - medical: Not on file  . Transportation needs - non-medical: Not on file  Occupational History    Comment: retired from [employer]  Tobacco Use  . Smoking status: Former Smoker    Packs/day: 1.50    Years: 15.00    Pack years: 22.50    Types: Cigarettes    Last attempt to quit: 04/20/2012    Years since quitting: 5.1  . Smokeless tobacco: [employer]    Types: Chew    Quit date: 04/20/2012  Substance and Sexual Activity  . Alcohol use: Yes    Comment: occ. use  . Drug use: No  . Sexual activity: Yes  Other Topics Concern  . Not on file  Social History Narrative   Lives at home with wife   Caffeine- coffee, 2 cups daily, occas tea     PHYSICAL EXAM  Vitals:   06/06/17 1025  BP: 115/69  Pulse: (!) 55  Weight: 206 lb (93.4 kg)   Body mass index is 29.56 kg/m.  Generalized: Well developed, in no acute distress  Head: normocephalic and atraumatic,. Oropharynx benign  Neck: Supple, no carotid bruits  Cardiac: Regular rate rhythm, no murmur  Musculoskeletal: No deformity   Neurological examination   Mentation: Alert oriented to time, place, history taking.  Continues with significant expressive aphasia, able to follow simple commands    Cranial nerve II-XII: Fundoscopic exam reveals sharp disc margins.Pupils were equal round reactive to light extraocular movements were full, visual field were full on  confrontational test. Facial sensation and strength were normal. hearing was intact to finger rubbing bilaterally. Uvula tongue midline. head turning and shoulder shrug were normal and symmetric.Tongue protrusion into cheek strength was normal. Motor: normal bulk and tone, full strength in the BUE, BLE, except continued right hand mild dexterity difficulties Sensory: normal and symmetric to light touch, pinprick, and  Vibration, in the upper and lower extremities Coordination: finger-nose-finger, heel-to-shin bilaterally, no dysmetria, no tremor Reflexes: 1+ upper lower and symmetric, plantar responses were flexor bilaterally. Gait and Station: Rising up from seated position without assistance, normal stance,  moderate stride, good arm swing, smooth turning, able to perform tiptoe,  and heel walking without difficulty. Tandem gait is steady  DIAGNOSTIC DATA (LABS, IMAGING, TESTING) - I reviewed patient records, labs, notes, testing and imaging myself where available.  Lab Results  Component Value Date   WBC 12.3 (H) 09/30/2016   HGB 11.2 (L) 09/30/2016   HCT 35.4 (L) 09/30/2016   MCV 84.3 09/30/2016   PLT 325 09/30/2016      Component Value Date/Time   NA 140 09/30/2016 0522   K 3.9 09/30/2016 0522   CL 104 09/30/2016 0522   CO2 26 09/30/2016 0522   GLUCOSE 108 (H) 09/30/2016 0522   BUN 14 09/30/2016 0522   CREATININE 0.75 09/30/2016 0522   CALCIUM 8.9 09/30/2016 0522   PROT 8.0 09/26/2016 1851   ALBUMIN 4.5 09/26/2016 1851   AST 34 09/26/2016 1851   ALT 28 09/26/2016 1851   ALKPHOS 95 09/26/2016 1851   BILITOT 0.5 09/26/2016 1851   GFRNONAA >60 09/30/2016 0522   GFRAA >60 09/30/2016 0522   Lab Results  Component Value Date   CHOL 108 09/27/2016   HDL 33 (L) 09/27/2016   LDLCALC 58 09/27/2016   TRIG 188 (H) 09/30/2016   CHOLHDL 3.3 09/27/2016   Lab Results  Component Value Date   HGBA1C 6.3 (H) 09/27/2016   ASSESSMENT AND PLAN 62 y.o. Caucasian male with PMH of CAD  with previous MI s/p CABG in 2014, hypertension, ureteral stricture and GERDadmitted on 09/26/16 for aphasia and right hemiplegia, received IV TPA. CT head and neck showed acute left ICA and proximal left M1 occlusion. Cerebral angio showed left ICA, MCA and ACA occlusion s/p mechanical thrombectomy with TICI3 reperfusion.  CTA head after showed evolving acute left MCA territory infarct with superimposed small hemorrhagic transformation.  MRI showed acute large left MCA territory infarct with petechial hemorrhage.  MRA head showed left brain lux reperfusion.  TTE EF 50-55%, dyskinesis of apical myocardium, consistent with infarction in the distribution of the left anterior descending coronary artery.  No visible thrombus, however there is a very slow, swirling flow in the dyskinetic apex. TEE showed EF 50-55%, smoke at the apex but no thrombus visualized.  No LAA thrombus.  No PFO.  No DVT LE venous Doppler.  Loop recorder placed.  He was put on aspirin 325 and continue the Lipitor 80 for stroke prevention.  Admitted on 02/05/17 at [facility] for acute onset of right sided weakness. CTA head and neck showed occulosive clot reported in the left ICA terminus. Mechanical embolectomy was done with TICI3 flow. MRI showed Foci of acute ischemic stroke seen surrounding previous large MCA infarcts. TTE EF 55-60%, again showing hypokinesis of the apex and apical wall segments.  And TEE showed echodensity noted in the aortic root adjacent to the noncoronary cusp of the right aortic wall, could presenting as calcified plaque, no thrombus seen. Antiphospholipid antibody negative. Patient condition improved closeto his baseline and he was discharged with ASA 81mg . Although loop recorder no afib, his two strokes are consistent with cardioembolic source, concerning for hypokinesis at apx. Will start Eliquis.  The patient is a current patient of [name]  who is out of the office today . This note is sent to the work  in doctor.     Plan:  Continue with Eliquis 5mg  twice a day.   OK to continue ASA 81mg  daily per cardiology according to pt Continue lipitor 80mg  for stroke prevention Continue speech therapy to improve aphasia. 3x weekly Follow up with your primary care physician  for stroke risk factor modification. Recommend maintain blood pressure goal <140/90, diabetes with hemoglobin A1c goal below 7.0% and lipids with LDL cholesterol goal below 70 mg/dL.  B/P in the office 115/69 Stay well hydrated Continue walking for exercise follow up with cardiology bleeding precautions Follow up in 6 months.  Nilda Riggs, [facility], [facility], APRN  [facility] [address], [address] [place], [place] [address] [phone]

## 2017-06-06 ENCOUNTER — Ambulatory Visit: Payer: BLUE CROSS/BLUE SHIELD | Admitting: Nurse Practitioner

## 2017-06-06 ENCOUNTER — Encounter: Payer: Self-pay | Admitting: Nurse Practitioner

## 2017-06-06 VITALS — BP 115/69 | HR 55 | Wt 206.0 lb

## 2017-06-06 DIAGNOSIS — E785 Hyperlipidemia, unspecified: Secondary | ICD-10-CM

## 2017-06-06 DIAGNOSIS — I63132 Cerebral infarction due to embolism of left carotid artery: Secondary | ICD-10-CM

## 2017-06-06 DIAGNOSIS — I10 Essential (primary) hypertension: Secondary | ICD-10-CM | POA: Diagnosis not present

## 2017-06-06 DIAGNOSIS — I639 Cerebral infarction, unspecified: Secondary | ICD-10-CM | POA: Diagnosis not present

## 2017-06-06 DIAGNOSIS — R4701 Aphasia: Secondary | ICD-10-CM

## 2017-06-06 NOTE — Patient Instructions (Signed)
Continue with Eliquis 5mg  twice a day.   OK to continue ASA 81mg  daily. Follow up with cardiology to see if ASA 81mg  is needed from cardiology standpoint Continue lipitor 80mg  for stroke prevention Continue speech therapy to improve aphasia. 3x weekly Follow up with your primary care physician for stroke risk factor modification. Recommend maintain blood pressure goal <140/90, diabetes with hemoglobin A1c goal below 7.0% and lipids with LDL cholesterol goal below 70 mg/dL.  B/P in the office 115/69 Stay well hydrated Continue walking for exercise follow up with cardiology bleeding precautions Follow up in 6 months.

## 2017-06-08 NOTE — Progress Notes (Signed)
I have reviewed and agreed above plan. 

## 2017-06-27 ENCOUNTER — Ambulatory Visit (INDEPENDENT_AMBULATORY_CARE_PROVIDER_SITE_OTHER): Payer: BLUE CROSS/BLUE SHIELD | Admitting: *Deleted

## 2017-06-27 DIAGNOSIS — I639 Cerebral infarction, unspecified: Secondary | ICD-10-CM

## 2017-06-29 NOTE — Progress Notes (Signed)
[facility] Summary Report / Loop Recorder 

## 2017-07-31 ENCOUNTER — Ambulatory Visit (INDEPENDENT_AMBULATORY_CARE_PROVIDER_SITE_OTHER): Payer: BLUE CROSS/BLUE SHIELD | Admitting: *Deleted

## 2017-07-31 DIAGNOSIS — I639 Cerebral infarction, unspecified: Secondary | ICD-10-CM | POA: Diagnosis not present

## 2017-07-31 LAB — CUP PACEART REMOTE DEVICE CHECK
Implantable Pulse Generator Implant Date: 20180525
MDC IDC SESS DTM: 20190220234240

## 2017-08-01 NOTE — Progress Notes (Signed)
[facility] Summary Report / Loop Recorder 

## 2017-08-30 DIAGNOSIS — Z0271 Encounter for disability determination: Secondary | ICD-10-CM

## 2017-09-04 ENCOUNTER — Ambulatory Visit (INDEPENDENT_AMBULATORY_CARE_PROVIDER_SITE_OTHER): Payer: BLUE CROSS/BLUE SHIELD | Admitting: *Deleted

## 2017-09-04 DIAGNOSIS — I639 Cerebral infarction, unspecified: Secondary | ICD-10-CM | POA: Diagnosis not present

## 2017-09-04 NOTE — Progress Notes (Signed)
[facility] Summary Report / Loop Recorder 

## 2017-09-08 LAB — CUP PACEART REMOTE DEVICE CHECK
Date Time Interrogation Session: 20190326000647
MDC IDC PG IMPLANT DT: 20180525

## 2017-09-27 LAB — CUP PACEART REMOTE DEVICE CHECK
Date Time Interrogation Session: 20190428001149
Implantable Pulse Generator Implant Date: 20180525

## 2017-10-05 ENCOUNTER — Ambulatory Visit (INDEPENDENT_AMBULATORY_CARE_PROVIDER_SITE_OTHER): Payer: BLUE CROSS/BLUE SHIELD | Admitting: *Deleted

## 2017-10-05 DIAGNOSIS — I639 Cerebral infarction, unspecified: Secondary | ICD-10-CM

## 2017-10-06 NOTE — Progress Notes (Signed)
[facility] Summary Report / Loop Recorder 

## 2017-10-30 LAB — CUP PACEART REMOTE DEVICE CHECK
Date Time Interrogation Session: 20190531003841
MDC IDC PG IMPLANT DT: 20180525

## 2017-11-07 ENCOUNTER — Ambulatory Visit (INDEPENDENT_AMBULATORY_CARE_PROVIDER_SITE_OTHER): Payer: BLUE CROSS/BLUE SHIELD | Admitting: *Deleted

## 2017-11-07 DIAGNOSIS — I639 Cerebral infarction, unspecified: Secondary | ICD-10-CM

## 2017-11-08 NOTE — Progress Notes (Signed)
[facility] Summary Report / Loop Recorder 

## 2017-12-11 ENCOUNTER — Ambulatory Visit (INDEPENDENT_AMBULATORY_CARE_PROVIDER_SITE_OTHER): Payer: BLUE CROSS/BLUE SHIELD | Admitting: *Deleted

## 2017-12-11 DIAGNOSIS — I639 Cerebral infarction, unspecified: Secondary | ICD-10-CM | POA: Diagnosis not present

## 2017-12-12 NOTE — Progress Notes (Signed)
[facility] Summary Report / Loop Recorder 

## 2017-12-13 NOTE — Progress Notes (Signed)
[facility]  PATIENT: Johnny Sullivan DOB: Dec 21, 1955   REASON FOR VISIT: Follow-up for stroke HISTORY FROM: Patient and daughter    HISTORY OF PRESENT ILLNESS:[name] a 62 y.o.malewith history of CAD with previous MI s/p CABG in 2014, hypertension, ureteral stricture and GERDadmitted on 09/26/16 for aphasia and right hemiplegia.The patient received IV TPA. CT head and neck showed acute left ICA and proximal left M1 occlusion. Cerebral angio showed left ICA, MCA and ACA occlusion s/p mechanical thrombectomy with TICI3 reperfusion.  CTA head after showed evolving acute left MCA territory infarct with superimposed small hemorrhagic transformation.  MRI showed acute large left MCA territory infarct with petechial hemorrhage.  MRA head showed left brain lux reperfusion.  TTE EF 50-55%, dyskinesis of apical myocardium, consistent with infarction in the distribution of the left anterior descending coronary artery.  No visible thrombus, however there is a very slow, swirling flow in the dyskinetic apex. TEE showed EF 50-55%, smoke at the apex but no thrombus visualized.  No LAA thrombus.  No PFO.  No DVT LE venous Doppler.  Loop recorder placed.  He was put on aspirin 325 and continue the Lipitor 80 for stroke prevention.  He had urethral stricture s/p suprapubic catheter and continue follow-up with Johnny Sullivan as outpatient.  Follow up 11/30/16 (Johnny Sullivan) - [name], 62 year old male returns for follow-up after embolic stroke. He is currently on aspirin 325 daily with minimal bruising and no bleeding.He has not had recurrent stroke or TIA symptoms. He remains on Lipitor without myalgias. Occupational therapy has stopped however he is continuing to get speech therapy for global aphasia. Patient lives in [place] and sees his cardiologist there.Blood pressure in the office today 140/86Loop recorder so far has not shown any evidence of atrial fibrillation.  Patient cannot go back to work until his speech improves.He returns for reevaluation.  Admission 02/05/17 Johnny Sullivan- acute onset of right sided weakness and right sided facial drooping, expressive aphasia admitted to [facility]. CT cerebral perfusion showed prolonged transite time in the left temporoparietal lobe lobe surrounding the previous infarct. CTA head and neck showed occulosive clot reported in the left ICA terminus. Mechanical embolectomy was done with TICI3 flow. MRI showed Foci of acute ischemic stroke seen within the left basal ganglia, internal capsule and overlying frontal cortex in the distribution of the left MCA and small focus of acute parenchymal hemorrhage in the left frontal lobe as seen on prior CT. TTE EF 55-60%, again showing hypokinesis of the apex and apical wall segments.  And TEE showed echodensity noted in the aortic root adjacent to the noncoronary cusp of the right aortic wall, could presenting as calcified plaque, no thrombus seen. Antiphospholipid antibody negative. Patient condition improved closeto his baseline and he was discharged with ASA 81mg .   Interval History10/29/18 Johnny Sullivan During the interval time, the patient has been doing well.  He continues to have expressive aphasia which has been the same since first admission.  Continue to have right hand dexterity difficulties but otherwise no physical weakness.  As per wife, patient had hematology follow-up and ruled out hypercoagulable state.  Loop recorder did not show A. fib.  BP today 137/76.  Still on aspirin 81 and Lipitor 80.  Very limited alcohol consumption since first admission, now still on the B1/FA/multivitamin. UPDATE 1/29/2019CM [name], 62 year old male returns for follow-up with history of stroke x2.  He continues to have expressive aphasia which is unchanged from his first admission.  He  continues to have some right hand dexterity difficulties but otherwise no physical weakness.  His  cardiologist is Johnny Sullivan in [place].  He remains on aspirin and Eliquis for secondary stroke prevention and cardiac prevention.  He has not had further stroke or TIA symptoms since last seen.  He has minimal bruising and no bleeding.  Blood pressure in the office today 115/69.  He remains on Lipitor without myalgias.  He continues to get speech therapy 3 times a week.  Appetite is good.  He also walks for exercise, denies any falls.  He has 1 beer once or twice a week since his admission.  He has stopped his thiamine and folic acid.  He returns for reevaluation UPDATE 8/12/2019CM [name], 62 year old male returns for follow-up with history of stroke x2.  His last stroke occurred in September 2018.  He continues to have expressive aphasia which is unchanged from his first admission 09/2016.  He has some mild right hand dexterity difficulties but otherwise no weakness.  He remains on aspirin and Eliquis for secondary stroke prevention and cardiac prevention.  He has not had further stroke or TIA symptoms.  Blood pressure in the office today 136/80.  He remains on Lipitor without myalgias.  Appetite is good.  He has not been exercising.  He continues to get speech therapy 3 times a week.  Loop recorder no atrial fib episodes.  He returns for reevaluation with no new neurologic complaints.  REVIEW OF SYSTEMS: Full 14 system review of systems performed and notable only for those listed, all others are neg:  Constitutional: neg  Cardiovascular: neg Ear/Nose/Throat: neg  Skin: neg Eyes: neg Respiratory: neg Gastroitestinal: neg  Hematology/Lymphatic: neg  Endocrine: neg Musculoskeletal:neg Allergy/Immunology: neg Neurological: Speech difficulty Psychiatric: neg Sleep : neg   ALLERGIES: Allergies  Allergen Reactions  . Alteplase Swelling and Other (See Comments)    Angioedema   . 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 Acetaminophen (MAPAP) 500 MG coapsule Take by mouth.    Marland Kitchen apixaban (ELIQUIS) 5 MG TABS tablet Take 1 tablet (5 mg total) by mouth 2 (two) times daily. 180 tablet 3  . aspirin EC 81 MG tablet Take 81 mg by mouth daily.    Marland Kitchen atorvastatin (LIPITOR) 80 MG tablet Take 80 mg by mouth daily at 6 PM.   3  . metoprolol tartrate (LOPRESSOR) 25 MG tablet 25 mg 2 (two) times daily.  6  . Multiple Vitamin (MULTIVITAMIN) capsule Take 1 capsule by mouth daily. For men > 50    . sertraline (ZOLOFT) 50 MG tablet Take 50 mg by mouth daily.  12  . zolpidem (AMBIEN) 5 MG tablet Take by mouth at bedtime as needed.     Marland Kitchen amoxicillin (AMOXIL) 500 MG capsule Take 2,000 mg by mouth See admin instructions. ONE HOUR PRIOR TO DENTAL APPOINTMENT  0  . folic acid (FOLVITE) 1 MG tablet Take 1 tablet (1 mg total) by mouth daily. (Patient not taking: Reported on 06/06/2017)    . thiamine 100 MG tablet Take 1 tablet (100 mg total) by mouth daily. (Patient not taking: Reported on 06/06/2017)     No facility-administered medications prior to visit.     PAST MEDICAL HISTORY: Past Medical History:  Diagnosis Date  . Arthritis    Osteoarthritis- right hip  . Coronary artery disease   .  GERD (gastroesophageal reflux disease)   . Hypertension   . Myocardial infarction (HCC)    " mild"-Dr. Abbey Chatters follows -gives clearance.  . Stroke (HCC)   . Urethral stricture    'uses self [place] caths weekly" to correct.    PAST SURGICAL HISTORY: Past Surgical History:  Procedure Laterality Date  . BACK SURGERY     lumbar discectomy  . CARDIAC CATHETERIZATION    . CORONARY ARTERY BYPASS GRAFT     09-20-2012 x3 -[place]  . IR PERCUTANEOUS ART THROMBECTOMY/INFUSION INTRACRANIAL INC DIAG ANGIO  09/26/2016  . LOOP RECORDER INSERTION N/A 09/30/2016   Procedure: Loop Recorder Insertion;  Surgeon: Hillis Range, MD;  Location: [facility]  INVASIVE CV LAB;  Service: Cardiovascular;  Laterality: N/A;  . RADIOLOGY WITH ANESTHESIA N/A 09/26/2016   Procedure: RADIOLOGY WITH ANESTHESIA;  Surgeon: Julieanne Cotton, MD;  Location: [facility] OR;  Service: Radiology;  Laterality: N/A;  . TEE WITHOUT CARDIOVERSION N/A 09/30/2016   Procedure: TRANSESOPHAGEAL ECHOCARDIOGRAM (TEE);  Surgeon: Laurey Morale, MD;  [facility];  Service: Cardiovascular;  Laterality: N/A;  . TOTAL HIP ARTHROPLASTY Right 04/26/2016   Procedure: RIGHT TOTAL HIP ARTHROPLASTY ANTERIOR APPROACH;  Surgeon: Durene Romans, MD;  Location: [facility] ORS;  Service: Orthopedics;  Laterality: Right;  Marland Kitchen VASECTOMY    . WRIST SURGERY     "implant of plastic bone"-mild ROM limits    FAMILY HISTORY: Family History  Problem Relation Age of Onset  . Alzheimer's disease Mother     SOCIAL HISTORY: Social History   Socioeconomic History  . Marital status: Married    Spouse name: Clydie Braun  . Number of children: 4  . Years of education: 48  . Highest education level: Not on file  Occupational History    Comment: retired from [employer]  Social Needs  . Financial resource strain: Not on file  . Food insecurity:    Worry: Not on file    Inability: Not on file  . Transportation needs:    Medical: Not on file    Non-medical: Not on file  Tobacco Use  . Smoking status: Former Smoker    Packs/day: 1.50    Years: 15.00    Pack years: 22.50    Types: Cigarettes    Last attempt to quit: 04/20/2012    Years since quitting: 5.6  . Smokeless tobacco: Former Neurosurgeon    Types: Chew    Quit date: 04/20/2012  Substance and Sexual Activity  . Alcohol use: Yes    Comment: occ. use  . Drug use: No  . Sexual activity: Yes  Lifestyle  . Physical activity:    Days per week: Not on file    Minutes per session: Not on file  . Stress: Not on file  Relationships  . Social connections:    Talks on phone: Not on file    Gets together: Not on file    Attends religious service: Not on file     Active member of club or organization: Not on file    Attends meetings of clubs or organizations: Not on file    Relationship status: Not on file  . Intimate partner violence:    Fear of current or ex partner: Not on file    Emotionally abused: Not on file    Physically abused: Not on file    Forced sexual activity: Not on file  Other Topics Concern  . Not on file  Social History Narrative   Lives at home with wife   Caffeine-  coffee, 2 cups daily, occas tea     PHYSICAL EXAM  Vitals:   12/18/17 1031  BP: 136/80  Pulse: (!) 51  Weight: 215 lb (97.5 kg)  Height: 5\' 10"  (1.778 m)   Body mass index is 30.85 kg/m.  Generalized: Well developed, in no acute distress  Head: normocephalic and atraumatic,. Oropharynx benign  Neck: Supple, no carotid bruits  Cardiac: Regular rate rhythm, no murmur  Musculoskeletal: No deformity   Neurological examination   Mentation: Alert oriented to time, place, history taking.  Continues with significant expressive aphasia, able to follow simple commands    Cranial nerve II-XII: Pupils were equal round reactive to light extraocular movements were full, visual field were full on confrontational test. Facial sensation and strength were normal. hearing was intact to finger rubbing bilaterally. Uvula tongue midline. head turning and shoulder shrug were normal and symmetric.Tongue protrusion into cheek strength was normal. Motor: normal bulk and tone, full strength in the BUE, BLE, right hand mild dexterity difficulties Sensory: normal and symmetric to light touch, pinprick, and  Vibration, in the upper and lower extremities Coordination: finger-nose-finger, heel-to-shin bilaterally, no dysmetria, no tremor Reflexes: 1+ upper lower and symmetric, plantar responses were flexor bilaterally. Gait and Station: Rising up from seated position without assistance, normal stance,  moderate stride, good arm swing, smooth turning, able to perform tiptoe, and  heel walking without difficulty. Tandem gait is steady.  No assistive device  DIAGNOSTIC DATA (LABS, IMAGING, TESTING) - I reviewed patient records, labs, notes, testing and imaging myself where available.  Lab Results  Component Value Date   WBC 12.3 (H) 09/30/2016   HGB 11.2 (L) 09/30/2016   HCT 35.4 (L) 09/30/2016   MCV 84.3 09/30/2016   PLT 325 09/30/2016      Component Value Date/Time   NA 140 09/30/2016 0522   K 3.9 09/30/2016 0522   CL 104 09/30/2016 0522   CO2 26 09/30/2016 0522   GLUCOSE 108 (H) 09/30/2016 0522   BUN 14 09/30/2016 0522   CREATININE 0.75 09/30/2016 0522   CALCIUM 8.9 09/30/2016 0522   PROT 8.0 09/26/2016 1851   ALBUMIN 4.5 09/26/2016 1851   AST 34 09/26/2016 1851   ALT 28 09/26/2016 1851   ALKPHOS 95 09/26/2016 1851   BILITOT 0.5 09/26/2016 1851   GFRNONAA >60 09/30/2016 0522   GFRAA >60 09/30/2016 0522   Lab Results  Component Value Date   CHOL 108 09/27/2016   HDL 33 (L) 09/27/2016   LDLCALC 58 09/27/2016   TRIG 188 (H) 09/30/2016   CHOLHDL 3.3 09/27/2016   Lab Results  Component Value Date   HGBA1C 6.3 (H) 09/27/2016   ASSESSMENT AND PLAN 62 y.o. Caucasian male with PMH of CAD with previous MI s/p CABG in 2014, hypertension, ureteral stricture and GERDadmitted on 09/26/16 for aphasia and right hemiplegia, received IV TPA. CT head and neck showed acute left ICA and proximal left M1 occlusion. Cerebral angio showed left ICA, MCA and ACA occlusion s/p mechanical thrombectomy with TICI3 reperfusion.  CTA head after showed evolving acute left MCA territory infarct with superimposed small hemorrhagic transformation.  MRI showed acute large left MCA territory infarct with petechial hemorrhage.  MRA head showed left brain lux reperfusion.  TTE EF 50-55%, dyskinesis of apical myocardium, consistent with infarction in the distribution of the left anterior descending coronary artery.  No visible thrombus, however there is a very slow, swirling flow in the  dyskinetic apex. TEE showed EF 50-55%, smoke at the apex  but no thrombus visualized.  No LAA thrombus.  No PFO.  No DVT LE venous Doppler.  Loop recorder placed.  He was put on aspirin 325 and continue the Lipitor 80 for stroke prevention.  Admitted on 02/05/17 at [facility] for acute onset of right sided weakness. CTA head and neck showed occulosive clot reported in the left ICA terminus. Mechanical embolectomy was done with TICI3 flow. MRI showed Foci of acute ischemic stroke seen surrounding previous large MCA infarcts. TTE EF 55-60%, again showing hypokinesis of the apex and apical wall segments.  And TEE showed echodensity noted in the aortic root adjacent to the noncoronary cusp of the right aortic wall, could presenting as calcified plaque, no thrombus seen. Antiphospholipid antibody negative. Patient condition improved closeto his baseline and he was discharged with ASA 81mg . Although loop recorder no afib, his two strokes are consistent with cardioembolic source, concerning for hypokinesis at apx. Will start Eliquis.  The patient is a current patient of Johnny Sullivan  who is out of the office today . This note is sent to the work in doctor.     Plan:  Continue with Eliquis 5mg  twice a day.   OK to continue ASA 81mg  daily per cardiology according to pt Continue lipitor 80mg  for stroke prevention Continue speech therapy to improve aphasia. 3x weekly Follow up with your primary care physician for stroke risk factor modification. Recommend maintain blood pressure goal <140/90, diabetes with hemoglobin A1c goal below 7.0% and lipids with LDL cholesterol goal below 70 mg/dL.  Stay well hydrated Walk for exercise recommend 30 min daily Continue follow up with cardiology Discharge from stroke clinic no further sx since Sept 2018  Nilda Riggs, [facility], [facility], APRN  [facility] [name] [place], [place] [address] [phone]

## 2017-12-14 LAB — CUP PACEART REMOTE DEVICE CHECK
Date Time Interrogation Session: 20190703004017
Implantable Pulse Generator Implant Date: 20180525

## 2017-12-18 ENCOUNTER — Ambulatory Visit: Payer: BLUE CROSS/BLUE SHIELD | Admitting: Nurse Practitioner

## 2017-12-18 ENCOUNTER — Encounter: Payer: Self-pay | Admitting: Nurse Practitioner

## 2017-12-18 VITALS — BP 136/80 | HR 51 | Ht 70.0 in | Wt 215.0 lb

## 2017-12-18 DIAGNOSIS — E785 Hyperlipidemia, unspecified: Secondary | ICD-10-CM

## 2017-12-18 DIAGNOSIS — I6300 Cerebral infarction due to thrombosis of unspecified precerebral artery: Secondary | ICD-10-CM | POA: Diagnosis not present

## 2017-12-18 DIAGNOSIS — R4701 Aphasia: Secondary | ICD-10-CM | POA: Diagnosis not present

## 2017-12-18 DIAGNOSIS — I10 Essential (primary) hypertension: Secondary | ICD-10-CM

## 2017-12-18 NOTE — Patient Instructions (Addendum)
Continue with Eliquis 5mg  twice a day.   OK to continue ASA 81mg  daily per cardiology according to pt Continue lipitor 80mg  for stroke prevention Continue speech therapy to improve aphasia. 3x weekly Follow up with your primary care physician for stroke risk factor modification. Recommend maintain blood pressure goal <140/90, diabetes with hemoglobin A1c goal below 7.0% and lipids with LDL cholesterol goal below 70 mg/dL.  Stay well hydrated Walk for exercise recommend 30 min daily Continue follow up with cardiology Discharge from stroke clinic no further sx since Sept 2018   Stroke Prevention Some health problems and behaviors may make it more likely for you to have a stroke. Below are ways to lessen your risk of having a stroke.  Be active for at least 30 minutes on most or all days.  Do not smoke. Try not to be around others who smoke.  Do not drink too much alcohol. ? Do not have more than 2 drinks a day if you are a man. ? Do not have more than 1 drink a day if you are a woman and are not pregnant.  Eat healthy foods, such as fruits and vegetables. If you were put on a specific diet, follow the diet as told.  Keep your cholesterol levels under control through diet and medicines. Look for foods that are low in saturated fat, trans fat, cholesterol, and are high in fiber.  If you have diabetes, follow all diet plans and take your medicine as told.  Ask your doctor if you need treatment to lower your blood pressure. If you have high blood pressure (hypertension), follow all diet plans and take your medicine as told by your doctor.  If you are 2718-62 years old, have your blood pressure checked every 3-5 years. If you are age 62 or older, have your blood pressure checked every year.  Keep a healthy weight. Eat foods that are low in calories, salt, saturated fat, trans fat, and cholesterol.  Do not take drugs.  Avoid birth control pills, if this applies. Talk to your doctor about  the risks of taking birth control pills.  Talk to your doctor if you have sleep problems (sleep apnea).  Take all medicine as told by your doctor. ? You may be told to take aspirin or blood thinner medicine. Take this medicine as told by your doctor. ? Understand your medicine instructions.  Make sure any other conditions you have are being taken care of.  Get help right away if:  You suddenly lose feeling (you feel numb) or have weakness in your face, arm, or leg.  Your face or eyelid hangs down to one side.  You suddenly feel confused.  You have trouble talking (aphasia) or understanding what people are saying.  You suddenly have trouble seeing in one or both eyes.  You suddenly have trouble walking.  You are dizzy.  You lose your balance or your movements are clumsy (uncoordinated).  You suddenly have a very bad headache and you do not know the cause.  You have new chest pain.  Your heart feels like it is fluttering or skipping a beat (irregular heartbeat). Do not wait to see if the symptoms above go away. Get help right away. Call your local emergency services (911 in [place] Do not drive yourself to the hospital. This information is not intended to replace advice given to you by your health care provider. Make sure you discuss any questions you have with your health care provider. Document Released:  10/25/2011 Document Revised: 10/01/2015 Document Reviewed: 10/26/2012 Elsevier Interactive Patient Education  [employer].

## 2018-01-03 ENCOUNTER — Telehealth: Payer: Self-pay | Admitting: Cardiology

## 2018-01-03 NOTE — Telephone Encounter (Signed)
Patient wife called and stated that patient wanted to stop monitoring his loop recorder and he wants to discuss having the loop recorder because he has had no events. Patient wife stated that she feels the loop recorder is no use to her husband. I informed pt wife that I would send note to MD and MD nurse and someone would be calling to schedule an appt to discuss removal.

## 2018-01-05 NOTE — Telephone Encounter (Signed)
Per Dr. Georgiann MohsAllred-ok to schedule OV to remove loop. Scheduling to call.

## 2018-01-10 NOTE — Progress Notes (Signed)
I agree with the assessment and plan as directed by NP .The patient is not known to me , I sign as WID.   Nester Bachus, MD  

## 2018-01-12 ENCOUNTER — Encounter: Payer: Self-pay | Admitting: Internal Medicine

## 2018-01-12 ENCOUNTER — Telehealth: Payer: Self-pay

## 2018-01-12 ENCOUNTER — Ambulatory Visit (INDEPENDENT_AMBULATORY_CARE_PROVIDER_SITE_OTHER): Payer: BLUE CROSS/BLUE SHIELD | Admitting: *Deleted

## 2018-01-12 DIAGNOSIS — I639 Cerebral infarction, unspecified: Secondary | ICD-10-CM

## 2018-01-12 NOTE — Telephone Encounter (Signed)
Left message for Pt to call office to set up appt to remove loop if still interested.

## 2018-01-13 NOTE — Progress Notes (Signed)
[facility] Summary Report / Loop Recorder 

## 2018-01-15 ENCOUNTER — Other Ambulatory Visit: Payer: Self-pay | Admitting: Internal Medicine

## 2018-01-15 ENCOUNTER — Ambulatory Visit (INDEPENDENT_AMBULATORY_CARE_PROVIDER_SITE_OTHER): Payer: BLUE CROSS/BLUE SHIELD | Admitting: Internal Medicine

## 2018-01-15 ENCOUNTER — Encounter: Payer: Self-pay | Admitting: Internal Medicine

## 2018-01-15 VITALS — BP 144/78 | HR 58 | Ht 70.0 in | Wt 213.0 lb

## 2018-01-15 DIAGNOSIS — I639 Cerebral infarction, unspecified: Secondary | ICD-10-CM | POA: Diagnosis not present

## 2018-01-15 NOTE — Patient Instructions (Addendum)
Medication Instructions:  Your physician recommends that you continue on your current medications as directed. Please refer to the Current Medication list given to you today.  Labwork: None ordered.  Testing/Procedures: None ordered.  Follow-Up: Your physician wants you to follow-up in: as needed with Dr. Allred.       Any Other Special Instructions Will Be Listed Below (If Applicable).  If you need a refill on your cardiac medications before your next appointment, please call your pharmacy.   

## 2018-01-15 NOTE — Progress Notes (Signed)
Primary Cardiologist: Phylliss Bob, MD   Primary EP: Dr Johney Frame  Johnny Sullivan is a 62 y.o. male who presents today for routine electrophysiology followup. 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   Since last being seen in our clinic, the patient reports doing very well. He has made recovery from his stroke.  He continues to have expressive aphasia.  No afib has been detected by ILR however he has been placed on off label eliquis for stroke prevention.  Today, he denies symptoms of palpitations, chest pain, shortness of breath,  lower extremity edema, dizziness, presyncope, or syncope.  The patient is otherwise without complaint today.   Past Medical History:  Diagnosis Date  . Arthritis    Osteoarthritis- right hip  . Coronary artery disease   . GERD (gastroesophageal reflux disease)   . Hypertension   . Myocardial infarction (HCC)    " mild"-Dr. Abbey Chatters follows -gives clearance.  . Stroke (HCC)   . Urethral stricture    'uses self [place] caths weekly" to correct.   Past Surgical History:  Procedure Laterality Date  . BACK SURGERY     lumbar discectomy  . CARDIAC CATHETERIZATION    . CORONARY ARTERY BYPASS GRAFT     09-20-2012 x3 [place]  . IR PERCUTANEOUS ART THROMBECTOMY/INFUSION INTRACRANIAL INC DIAG ANGIO  09/26/2016  . LOOP RECORDER INSERTION N/A 09/30/2016   Procedure: Loop Recorder Insertion;  Surgeon: Hillis Range, MD;  [facility];  Service: Cardiovascular;  Laterality: N/A;  . RADIOLOGY WITH ANESTHESIA N/A 09/26/2016   Procedure: RADIOLOGY WITH ANESTHESIA;  Surgeon: Julieanne Cotton, MD;  Location: [facility] OR;  Service: Radiology;  Laterality: N/A;  . TEE WITHOUT CARDIOVERSION N/A 09/30/2016   Procedure: TRANSESOPHAGEAL ECHOCARDIOGRAM (TEE);  Surgeon: Laurey Morale, MD;  Location[facility] ENDOSCOPY;  Service: Cardiovascular;  Laterality: N/A;  . TOTAL HIP ARTHROPLASTY Right 04/26/2016   Procedure: RIGHT TOTAL HIP  ARTHROPLASTY ANTERIOR APPROACH;  Surgeon: Durene Romans, MD;  Location: [facility] ORS;  Service: Orthopedics;  Laterality: Right;  Marland Kitchen VASECTOMY    . WRIST SURGERY     "implant of plastic bone"-mild ROM limits    ROS- all systems are reviewed and negatives except as per HPI above  Current Outpatient Medications  Medication Sig Dispense Refill  . Acetaminophen (MAPAP) 500 MG coapsule Take by mouth.    Marland Kitchen amoxicillin (AMOXIL) 500 MG capsule Take 2,000 mg by mouth See admin instructions. ONE HOUR PRIOR TO DENTAL APPOINTMENT  0  . apixaban (ELIQUIS) 5 MG TABS tablet Take 1 tablet (5 mg total) by mouth 2 (two) times daily. 180 tablet 3  . aspirin EC 81 MG tablet Take 81 mg by mouth daily.    Marland Kitchen atorvastatin (LIPITOR) 80 MG tablet Take 80 mg by mouth daily at 6 PM.   3  . metoprolol tartrate (LOPRESSOR) 25 MG tablet 25 mg 2 (two) times daily.  6  . Multiple Vitamin (MULTIVITAMIN) capsule Take 1 capsule by mouth daily. For men > 50    . sertraline (ZOLOFT) 50 MG tablet Take 50 mg by mouth daily.  12  . zolpidem (AMBIEN) 5 MG tablet Take by mouth at bedtime as needed.      No current facility-administered medications for this visit.     Physical Exam: Vitals:   01/15/18 1028  BP: (!) 144/78  Pulse: (!) 58  SpO2: 97%  Weight: 213 lb (96.6 kg)  Height: 5\' 10"  (1.778 m)    GEN- The patient is well  appearing, alert and oriented x 3 today.   Head- normocephalic, atraumatic Eyes-  Sclera clear, conjunctiva pink Ears- hearing intact Oropharynx- clear Lungs- Clear to ausculation bilaterally, normal work of breathing Heart- Regular rate and rhythm, no murmurs, rubs or gallops, PMI not laterally displaced GI- soft, NT, ND, + BS Extremities- no clubbing, cyanosis, or edema Neuro- expressive aphasia noted  Wt Readings from Last 3 Encounters:  01/15/18 213 lb (96.6 kg)  12/18/17 215 lb (97.5 kg)  06/06/17 206 lb (93.4 kg)    ILR interrogation today is personally reviewed and shows no afib  episodes  Assessment and Plan:  1. Cryptogenic stroke Interestingly, he has been placed on eliquis without documentation of afib.  He is aware of this off label use. He wishes to have ILR removed as he is concerned about copays of remote monitoring.  I have discussed options at length with patient, daughter, and wife (by phone) today. I have advised that he should continue to keep the ILR in place as he has 2 years of battery remaining.  Given their concerns for cost, he can disenroll in remote monitoring at this time.  He is aware that we will not know when arrhythmias arise without office interrogation of the device.  I think that this approach however is better than removing the device in the office today.  They agree.  He will continue to have his device followed by Dr Earna Coder in the office.  HE will return as needed.  Hillis Range MD, [facility] 01/15/2018 10:45 AM

## 2018-01-22 LAB — CUP PACEART REMOTE DEVICE CHECK
Implantable Pulse Generator Implant Date: 20180525
MDC IDC SESS DTM: 20190805013906

## 2018-02-02 LAB — CUP PACEART REMOTE DEVICE CHECK
Implantable Pulse Generator Implant Date: 20180525
MDC IDC SESS DTM: 20190907021200

## 2019-04-18 IMAGING — CT CT HEAD W/O CM
3 of 4 series · 14 of 47 positions shown, 16 images · non-contrast
Comparison: Brain MRI 09/27/2016

Head CT 09/26/2016

CLINICAL DATA: Recent stroke

EXAM:
CT HEAD WITHOUT CONTRAST
TECHNIQUE: Contiguous axial images were obtained from the base of the skull
through the vertex without intravenous contrast.

[Series 3: head 2.0 h70h · axial · 0.42mm/px · z∈[-120,-18]mm · 8 of 65 slices shown, 10 images]
[im 7/65  brain]
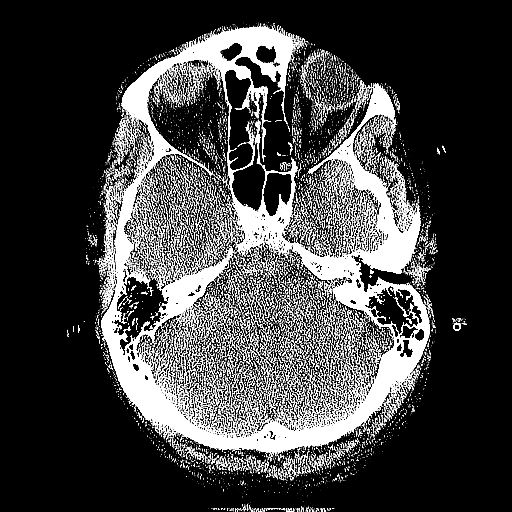
[im 7/65  bone]
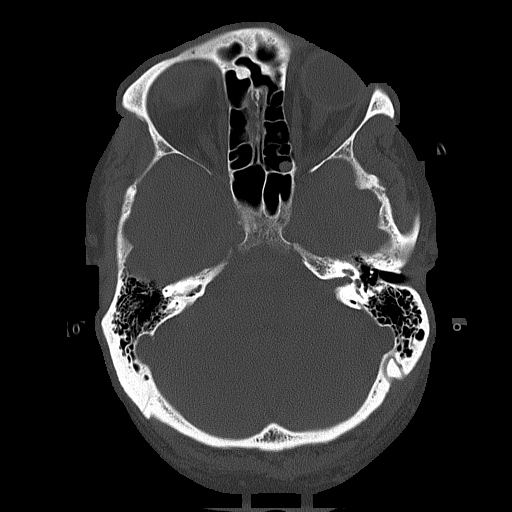
[im 13/65  brain]
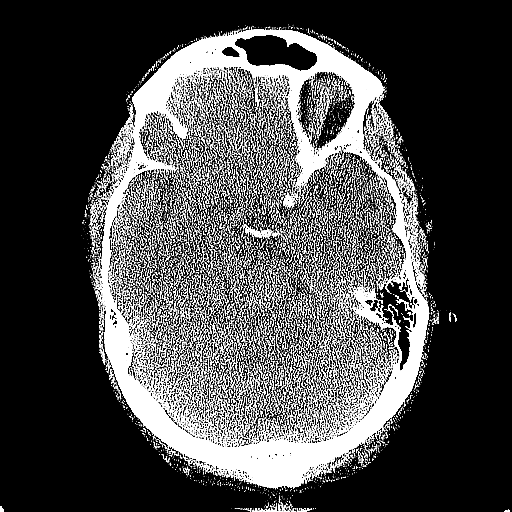
[im 20/65  brain]
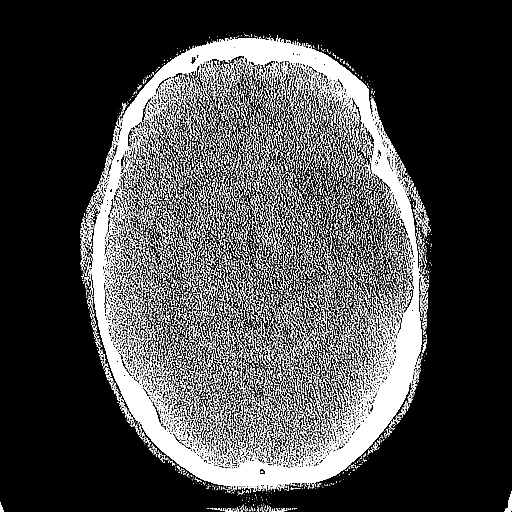
[im 29/65  brain]
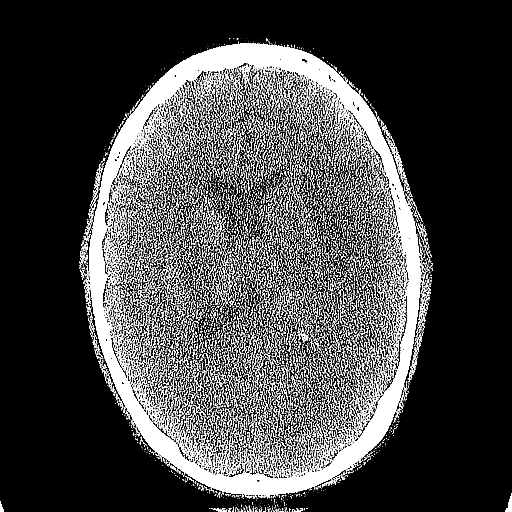
[im 36/65  brain]
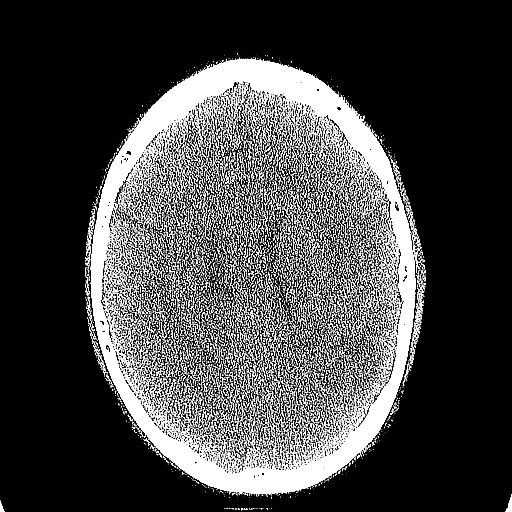
[im 36/65  bone]
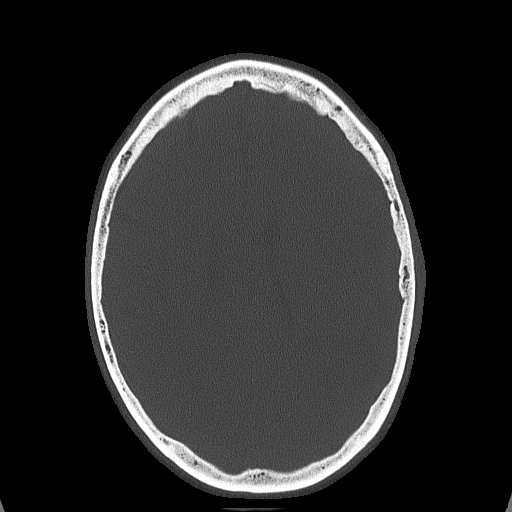
[im 45/65  brain]
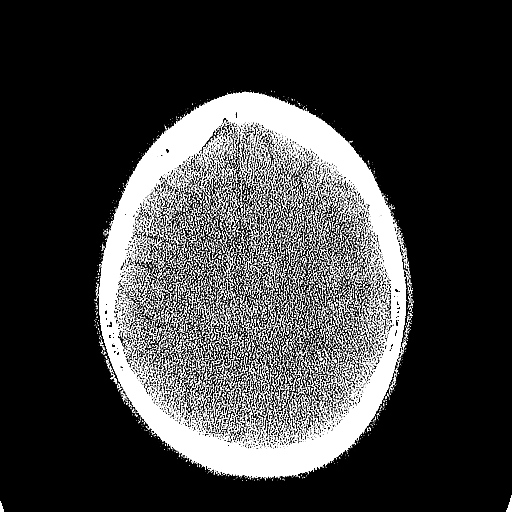
[im 52/65  brain]
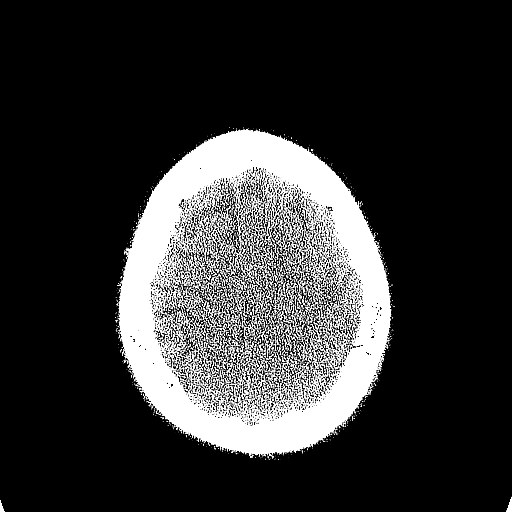
[im 58/65  brain]
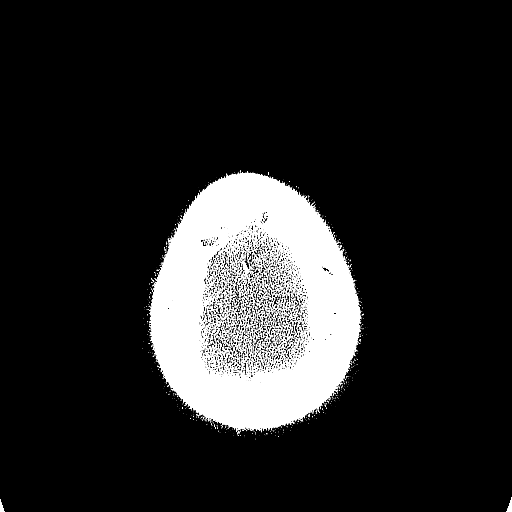

[Series 4: head 3.0 mpr cor · coronal · 0.26mm/px · 3 of 70 slices shown]
[im 24/70  brain]
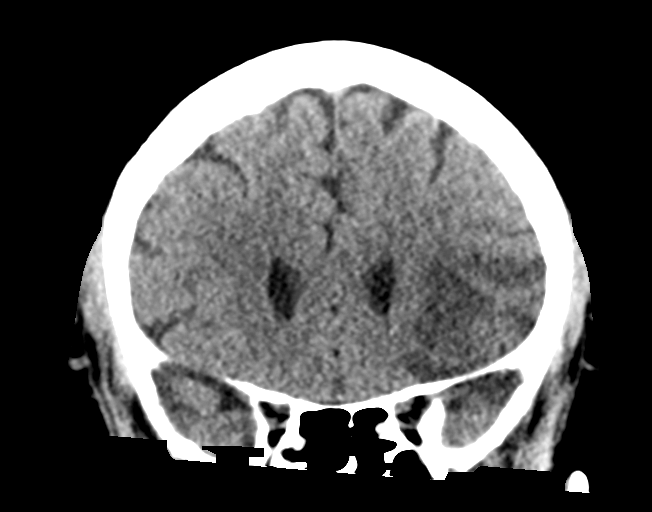
[im 31/70  brain]
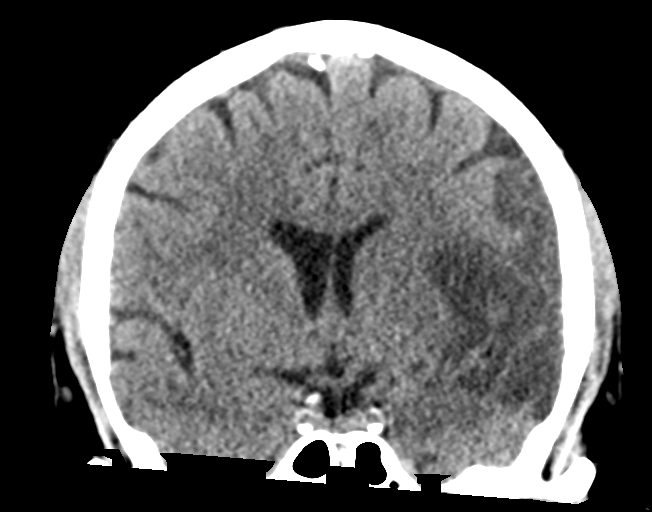
[im 39/70  brain]
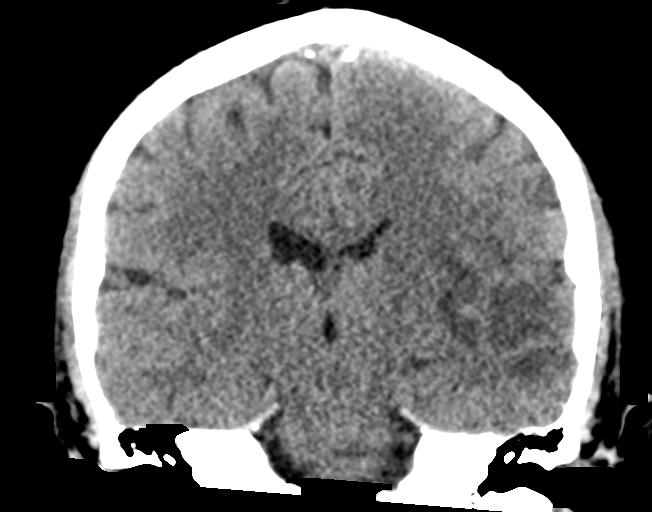

[Series 5: head 3.0 mpr sag · sagittal · 0.32mm/px · 3 of 67 slices shown]
[im 23/67  brain]
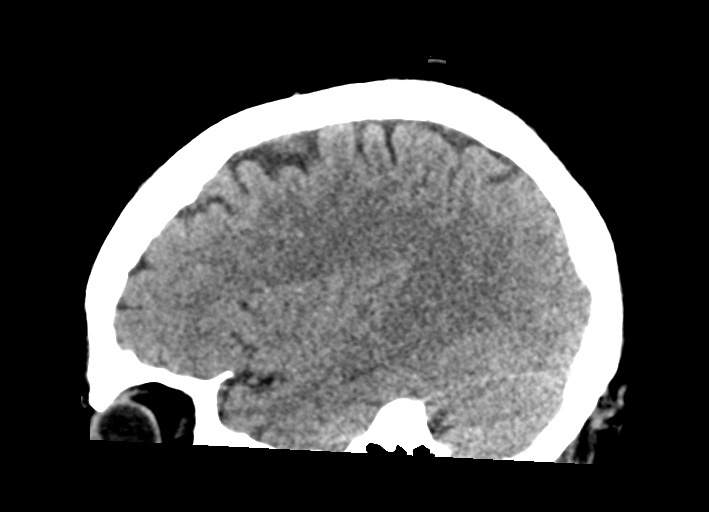
[im 34/67  brain]
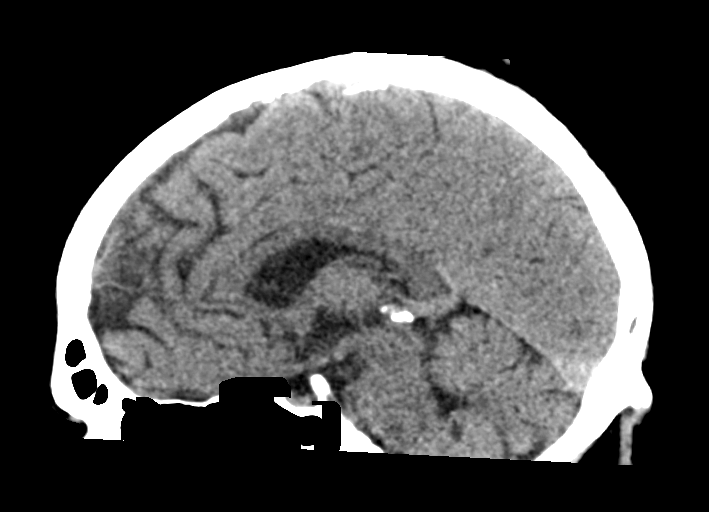
[im 45/67  brain]
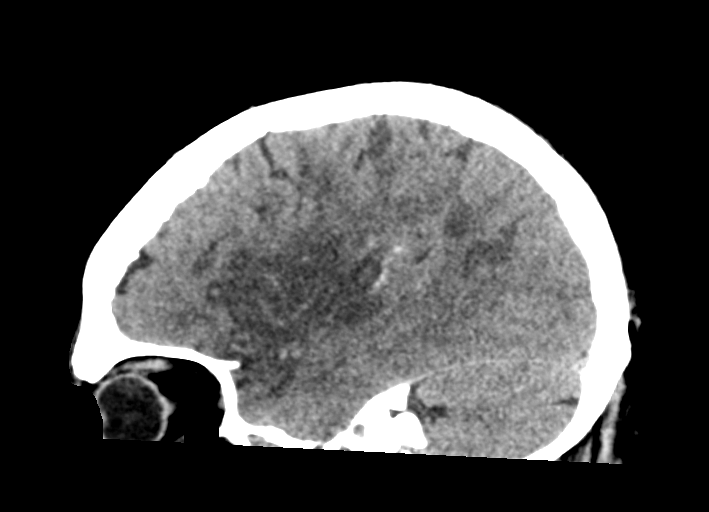

[14 of 47 positions shown; findings below may reference images not displayed]

FINDINGS: Brain: Continued expected evolution of hypoattenuation at the site
of left MCA infarct. Small amount of residual hyperdensity is
decreased from the previous study and likely indicates resolving
contrast staining. There is minimal rightward midline shift of 2 mm.
No hydrocephalus. Unchanged appearance of the remainder of the
brain.

Vascular: No hyperdense vessel or unexpected calcification.

Skull: Normal visualized skull base, calvarium and extracranial soft
tissues.

Sinuses/Orbits: No sinus fluid levels or advanced mucosal
thickening. No mastoid effusion. Normal orbits.
IMPRESSION: 1. Continued expected evolution of left MCA infarct without new
acute finding.
2. Trace residual hyperdensity in this area is favored to indicate
contrast staining. Continued attention on follow-up is recommended
to exclude hemorrhage.

## 2023-09-14 ENCOUNTER — Encounter (INDEPENDENT_AMBULATORY_CARE_PROVIDER_SITE_OTHER): Payer: Self-pay | Admitting: *Deleted

## 2023-10-09 ENCOUNTER — Ambulatory Visit (INDEPENDENT_AMBULATORY_CARE_PROVIDER_SITE_OTHER): Admitting: Gastroenterology

## 2023-10-09 ENCOUNTER — Encounter (INDEPENDENT_AMBULATORY_CARE_PROVIDER_SITE_OTHER): Payer: Self-pay | Admitting: Gastroenterology

## 2023-10-09 VITALS — BP 126/66 | HR 80 | Temp 97.1°F | Ht 70.0 in | Wt 206.2 lb

## 2023-10-09 DIAGNOSIS — D649 Anemia, unspecified: Secondary | ICD-10-CM

## 2023-10-09 DIAGNOSIS — K921 Melena: Secondary | ICD-10-CM | POA: Insufficient documentation

## 2023-10-09 DIAGNOSIS — Z1211 Encounter for screening for malignant neoplasm of colon: Secondary | ICD-10-CM | POA: Insufficient documentation

## 2023-10-09 NOTE — Patient Instructions (Signed)
 Schedule EGD and colonoscopy Perform blood workup Hold Eliquis  for now

## 2023-10-09 NOTE — Progress Notes (Signed)
 Johnny Sullivan, M.D. Gastroenterology & Hepatology [facility] Gastroenterology [address] [place], [place] [address] Primary Care Physician: Johnny Dudley, MD [address] [place] [address]  Referring MD: PCP  Chief Complaint: Anemia  History of Present Illness: Johnny Sullivan is a 68 y.o. male with past medical history of cryptogenic stroke, GERD, hypertension, coronary artery disease status post CABG, myocardial infarction, who presents for evaluation of  anemia.  No recent labs are available in the referral paperwork.  Patient was referred to our clinic after being found to have a low hemoglobin.  Patient and wife report that he has had melena for 3 weeks. Last episode of melena was on Friday. He had presented upper abdominal pain, but it has resolved since Friday.  Wife provides some of the history as he has some aphasia. She reports that he was found to have a low hemoglobin of 10.5, but these results are not available to me.  Patient has not taken any NSAIDs or high dose aspirin .  He was advised to stop Eliquis  on Wednesday. He has been taking this medication for history of stroke. Not taking this for afib as ILR did not document this.  The patient denies having any nausea, vomiting, fever, chills, hematochezia, hematemesis, abdominal distention, diarrhea, jaundice, pruritus . Has lost a few lb recently, possibly 10 lb.  Last UJW:JXBJY Last Colonoscopy:Dr. Shifflet performed it possibly 15 years ago.  FHx: neg for any gastrointestinal/liver disease, grandmother colon cancer, grandfather prostate cancer Social: quit smoking in 2014, heavy alcohol use but stopped 3 months ago, smoked marijuana in the past Surgical: no abdominal surgeries  Past Medical History: Past Medical History:  Diagnosis Date   Arthritis    Osteoarthritis- right hip   Coronary artery disease    GERD (gastroesophageal reflux disease)     Hypertension    Myocardial infarction (HCC)    " mild"-Dr. Zakhary,cardiolgy-[place] follows -gives clearance.   Stroke [facility])    Urethral stricture    'uses self [place] caths weekly" to correct.    Past Surgical History: Past Surgical History:  Procedure Laterality Date   BACK SURGERY     lumbar discectomy   CARDIAC CATHETERIZATION     CORONARY ARTERY BYPASS GRAFT     09-20-2012 x3 -[place]   IR PERCUTANEOUS ART THROMBECTOMY/INFUSION INTRACRANIAL INC DIAG ANGIO  09/26/2016   LOOP RECORDER INSERTION N/A 09/30/2016   Procedure: Loop Recorder Insertion;  Surgeon: Jolly Needle, MD;  [facility] INVASIVE CV LAB;  Service: Cardiovascular;  Laterality: N/A;   RADIOLOGY WITH ANESTHESIA N/A 09/26/2016   Procedure: RADIOLOGY WITH ANESTHESIA;  Surgeon: Luellen Sages, MD;  Location: [facility] OR;  Service: Radiology;  Laterality: N/A;   TEE WITHOUT CARDIOVERSION N/A 09/30/2016   Procedure: TRANSESOPHAGEAL ECHOCARDIOGRAM (TEE);  Surgeon: Darlis Eisenmenger, MD;  Location: [facility] ENDOSCOPY;  Service: Cardiovascular;  Laterality: N/A;   TOTAL HIP ARTHROPLASTY Right 04/26/2016   Procedure: RIGHT TOTAL HIP ARTHROPLASTY ANTERIOR APPROACH;  Surgeon: Claiborne Crew, MD;  Location: [facility] ORS;  Service: Orthopedics;  Laterality: Right;   VASECTOMY     WRIST SURGERY     "implant of plastic bone"-mild ROM limits    Family History: Family History  Problem Relation Age of Onset   Alzheimer's disease Mother     Social History: Social History   Tobacco Use  Smoking Status Former   Current packs/day: 0.00   Average packs/day: 1.5 packs/day for 15.0 years (22.5 ttl pk-yrs)   Types: Cigarettes  Start date: 04/20/1997   Quit date: 04/20/2012   Years since quitting: 11.4  Smokeless Tobacco Former   Types: Chew   Quit date: 04/20/2012   Social History   Substance and Sexual Activity  Alcohol Use Yes   Comment: occ. 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     amLODipine (NORVASC) 10 MG tablet Take 10 mg by mouth daily.     amoxicillin (AMOXIL) 500 MG capsule Take 2,000 mg by mouth See admin instructions. ONE HOUR PRIOR TO DENTAL APPOINTMENT  0   aspirin  EC 81 MG tablet Take 81 mg by mouth daily.     atorvastatin  (LIPITOR ) 80 MG tablet Take 80 mg by mouth daily at 6 PM.   3   carvedilol (COREG) 12.5 MG tablet Take 12.5 mg by mouth 2 (two) times daily with a meal.     hydrochlorothiazide  (MICROZIDE ) 12.5 MG capsule Take 12.5 mg by mouth daily.     Multiple Vitamin (MULTIVITAMIN) capsule Take 1 capsule by mouth daily. For men > 50     sertraline (ZOLOFT) 50 MG tablet Take 50 mg by mouth daily.  12   apixaban  (ELIQUIS ) 5 MG TABS tablet Take 1 tablet (5 mg total) by mouth 2 (two) times daily. (Patient not taking: Reported on 10/09/2023) 180 tablet 3   No current facility-administered medications for this visit.    Review of Systems: GENERAL: negative for malaise, night sweats HEENT: No changes in hearing or vision, no nose bleeds or other nasal problems. NECK: Negative for lumps, goiter, pain and significant neck swelling RESPIRATORY: Negative for cough, wheezing CARDIOVASCULAR: Negative for chest pain, leg swelling, palpitations, orthopnea GI: SEE HPI MUSCULOSKELETAL: Negative for joint pain or swelling, back pain, and muscle pain. SKIN: Negative for lesions, rash PSYCH: Negative for sleep disturbance, mood disorder and recent psychosocial stressors. HEMATOLOGY Negative for prolonged bleeding, bruising easily, and swollen nodes. ENDOCRINE: Negative for cold or heat intolerance, polyuria, polydipsia and  goiter. NEURO: negative for tremor, gait imbalance, syncope and seizures. The remainder of the review of systems is noncontributory.   Physical Exam: BP 126/66 (BP Location: Left Arm, Patient Position: Sitting, Cuff Size: Normal)   Pulse 80   Temp (!) 97.1 F (36.2 C) (Temporal)   Ht 5\' 10"  (1.778 m)   Wt 206 lb 3.2 oz (93.5 kg)   BMI 29.59 kg/m  GENERAL: The patient is AO x3, in no acute distress. HEENT: Head is normocephalic and atraumatic. EOMI are intact. Mouth is well hydrated and without lesions. NECK: Supple. No masses LUNGS: Clear to auscultation. No presence of rhonchi/wheezing/rales. Adequate chest expansion HEART: RRR, normal s1 and s2. ABDOMEN: Soft, nontender, no guarding, no peritoneal signs, and nondistended. BS +. No masses. EXTREMITIES: Without any cyanosis, clubbing, rash, lesions or edema. NEUROLOGIC: AOx3, no focal motor deficit. SKIN: no jaundice, no rashes   Imaging/Labs: as above  I personally reviewed and interpreted the available labs, imaging and endoscopic files.  Impression and Plan: Johnny Sullivan is a 68 y.o. male with past medical history of cryptogenic stroke, GERD, hypertension, coronary artery disease status post  CABG, myocardial infarction, who presents for evaluation of  anemia.  Patient had presented some episodes of possible melena that have resolved after he stopped using Eliquis .  Abdominal pain also resolved at that time.  It is possible he has presented progressive intestinal bleeding due to peptic ulcer disease, but other etiologies such as AVMs, malignancy may need to be evaluated as well with an EGD.  Will schedule him for EGD next week as it is the next available slot.  Will need to continue holding his Eliquis  for now and we will start him on omeprazole 40 mg every day.  He is also due for colorectal cancer screening, will be scheduled for colonoscopy at the same time of his esophagogastroduodenospy.  -Schedule EGD and  colonoscopy -Perform blood workup -Start omeprazole 40 mg every day -Hold Eliquis  for now  All questions were answered.      Johnny Cress, MD Gastroenterology and Hepatology [facility] Gastroenterology

## 2023-10-10 ENCOUNTER — Other Ambulatory Visit: Payer: Self-pay

## 2023-10-10 ENCOUNTER — Ambulatory Visit (INDEPENDENT_AMBULATORY_CARE_PROVIDER_SITE_OTHER): Payer: Self-pay | Admitting: Gastroenterology

## 2023-10-10 ENCOUNTER — Telehealth (INDEPENDENT_AMBULATORY_CARE_PROVIDER_SITE_OTHER): Payer: Self-pay | Admitting: Gastroenterology

## 2023-10-10 ENCOUNTER — Encounter: Payer: Self-pay | Admitting: *Deleted

## 2023-10-10 ENCOUNTER — Other Ambulatory Visit (INDEPENDENT_AMBULATORY_CARE_PROVIDER_SITE_OTHER): Payer: Self-pay | Admitting: Gastroenterology

## 2023-10-10 ENCOUNTER — Observation Stay (HOSPITAL_COMMUNITY)
Admission: EM | Admit: 2023-10-10 | Discharge: 2023-10-13 | Disposition: A | Discharge status: Home / Self Care | Attending: Family Medicine | Admitting: Family Medicine

## 2023-10-10 ENCOUNTER — Encounter (HOSPITAL_COMMUNITY): Payer: Self-pay

## 2023-10-10 DIAGNOSIS — K3189 Other diseases of stomach and duodenum: Secondary | ICD-10-CM | POA: Insufficient documentation

## 2023-10-10 DIAGNOSIS — K5730 Diverticulosis of large intestine without perforation or abscess without bleeding: Secondary | ICD-10-CM

## 2023-10-10 DIAGNOSIS — I2510 Atherosclerotic heart disease of native coronary artery without angina pectoris: Secondary | ICD-10-CM | POA: Insufficient documentation

## 2023-10-10 DIAGNOSIS — E782 Mixed hyperlipidemia: Secondary | ICD-10-CM | POA: Diagnosis not present

## 2023-10-10 DIAGNOSIS — Z8673 Personal history of transient ischemic attack (TIA), and cerebral infarction without residual deficits: Secondary | ICD-10-CM | POA: Diagnosis not present

## 2023-10-10 DIAGNOSIS — Z7982 Long term (current) use of aspirin: Secondary | ICD-10-CM | POA: Insufficient documentation

## 2023-10-10 DIAGNOSIS — D120 Benign neoplasm of cecum: Secondary | ICD-10-CM | POA: Diagnosis not present

## 2023-10-10 DIAGNOSIS — Z951 Presence of aortocoronary bypass graft: Secondary | ICD-10-CM | POA: Diagnosis not present

## 2023-10-10 DIAGNOSIS — D649 Anemia, unspecified: Secondary | ICD-10-CM | POA: Diagnosis not present

## 2023-10-10 DIAGNOSIS — F1091 Alcohol use, unspecified, in remission: Secondary | ICD-10-CM

## 2023-10-10 DIAGNOSIS — F10920 Alcohol use, unspecified with intoxication, uncomplicated: Secondary | ICD-10-CM | POA: Diagnosis not present

## 2023-10-10 DIAGNOSIS — I10 Essential (primary) hypertension: Secondary | ICD-10-CM | POA: Insufficient documentation

## 2023-10-10 DIAGNOSIS — K635 Polyp of colon: Secondary | ICD-10-CM

## 2023-10-10 DIAGNOSIS — D500 Iron deficiency anemia secondary to blood loss (chronic): Secondary | ICD-10-CM

## 2023-10-10 DIAGNOSIS — K921 Melena: Secondary | ICD-10-CM | POA: Diagnosis not present

## 2023-10-10 DIAGNOSIS — D509 Iron deficiency anemia, unspecified: Secondary | ICD-10-CM

## 2023-10-10 DIAGNOSIS — K922 Gastrointestinal hemorrhage, unspecified: Secondary | ICD-10-CM | POA: Diagnosis present

## 2023-10-10 DIAGNOSIS — K648 Other hemorrhoids: Secondary | ICD-10-CM | POA: Insufficient documentation

## 2023-10-10 LAB — CBC WITH DIFFERENTIAL/PLATELET
Abs Immature Granulocytes: 0.02 10*3/uL (ref 0.00–0.07)
Absolute Lymphocytes: 2643 {cells}/uL (ref 850–3900)
Absolute Monocytes: 941 {cells}/uL (ref 200–950)
Basophils Absolute: 45 {cells}/uL (ref 0–200)
Basophils Absolute: 0.1 10*3/uL (ref 0.0–0.1)
Basophils Relative: 0.4 %
Basophils Relative: 1 %
Eosinophils Absolute: 179 {cells}/uL (ref 15–500)
Eosinophils Absolute: 0.2 10*3/uL (ref 0.0–0.5)
Eosinophils Relative: 1.6 %
Eosinophils Relative: 2 %
HCT: 23.9 % — ABNORMAL LOW (ref 38.5–50.0)
HCT: 23.9 % — ABNORMAL LOW (ref 39.0–52.0)
Hemoglobin: 7.1 g/dL — ABNORMAL LOW (ref 13.2–17.1)
Hemoglobin: 7.2 g/dL — ABNORMAL LOW (ref 13.0–17.0)
Immature Granulocytes: 0 %
Lymphocytes Relative: 23 %
Lymphs Abs: 2 10*3/uL (ref 0.7–4.0)
MCH: 22.9 pg — ABNORMAL LOW (ref 27.0–33.0)
MCH: 23.8 pg — ABNORMAL LOW (ref 26.0–34.0)
MCHC: 29.7 g/dL — ABNORMAL LOW (ref 32.0–36.0)
MCHC: 30.1 g/dL (ref 30.0–36.0)
MCV: 77.1 fL — ABNORMAL LOW (ref 80.0–100.0)
MCV: 79.1 fL — ABNORMAL LOW (ref 80.0–100.0)
MPV: 10.1 fL (ref 7.5–12.5)
Monocytes Absolute: 0.7 10*3/uL (ref 0.1–1.0)
Monocytes Relative: 8.4 %
Monocytes Relative: 8 %
Neutro Abs: 7392 {cells}/uL (ref 1500–7800)
Neutro Abs: 5.6 10*3/uL (ref 1.7–7.7)
Neutrophils Relative %: 66 %
Neutrophils Relative %: 66 %
Platelets: 587 10*3/uL — ABNORMAL HIGH (ref 140–400)
Platelets: 497 10*3/uL — ABNORMAL HIGH (ref 150–400)
RBC: 3.1 10*6/uL — ABNORMAL LOW (ref 4.20–5.80)
RBC: 3.02 MIL/uL — ABNORMAL LOW (ref 4.22–5.81)
RDW: 15.5 % — ABNORMAL HIGH (ref 11.0–15.0)
RDW: 15.7 % — ABNORMAL HIGH (ref 11.5–15.5)
Total Lymphocyte: 23.6 %
WBC: 11.2 10*3/uL — ABNORMAL HIGH (ref 3.8–10.8)
WBC: 8.5 10*3/uL (ref 4.0–10.5)
nRBC: 0 % (ref 0.0–0.2)

## 2023-10-10 LAB — COMPREHENSIVE METABOLIC PANEL WITH GFR
ALT: 13 U/L (ref 0–44)
AST: 17 U/L (ref 15–41)
Albumin: 3.6 g/dL (ref 3.5–5.0)
Alkaline Phosphatase: 121 U/L (ref 38–126)
Anion gap: 7 (ref 5–15)
BUN: 12 mg/dL (ref 8–23)
CO2: 26 mmol/L (ref 22–32)
Calcium: 8.9 mg/dL (ref 8.9–10.3)
Chloride: 103 mmol/L (ref 98–111)
Creatinine, Ser: 0.72 mg/dL (ref 0.61–1.24)
GFR, Estimated: >60 mL/min (ref >60)
Glucose, Bld: 116 mg/dL — ABNORMAL HIGH (ref 70–99)
Potassium: 3.5 mmol/L (ref 3.5–5.1)
Sodium: 136 mmol/L (ref 135–145)
Total Bilirubin: 0.6 mg/dL (ref 0.0–1.2)
Total Protein: 7.4 g/dL (ref 6.5–8.1)

## 2023-10-10 LAB — PREPARE RBC (CROSSMATCH)

## 2023-10-10 LAB — IRON,TIBC AND FERRITIN PANEL
%SAT: 4 % — ABNORMAL LOW (ref 20–48)
Ferritin: 4 ng/mL — ABNORMAL LOW (ref 24–380)
Iron: 15 ug/dL — ABNORMAL LOW (ref 50–180)
TIBC: 426 ug/dL — ABNORMAL HIGH (ref 250–425)

## 2023-10-10 LAB — PROTIME-INR
INR: 1 (ref 0.8–1.2)
Prothrombin Time: 13 s (ref 11.4–15.2)

## 2023-10-10 MED ORDER — ONDANSETRON HCL 4 MG PO TABS: 4.0000 mg | ORAL_TABLET | Freq: Four times a day (QID) | ORAL | Status: DC | PRN

## 2023-10-10 MED ORDER — ATORVASTATIN CALCIUM 40 MG PO TABS
80.0000 mg | ORAL_TABLET | Freq: Every day | ORAL | Status: DC
  Administered 2023-10-10 – 2023-10-12 (×3): 80 mg via ORAL
  Filled 2023-10-10 (×3): qty 2

## 2023-10-10 MED ORDER — ACETAMINOPHEN 325 MG PO TABS: 650.0000 mg | ORAL_TABLET | Freq: Four times a day (QID) | ORAL | Status: DC | PRN

## 2023-10-10 MED ORDER — OMEPRAZOLE 40 MG PO CPDR: 40.0000 mg | DELAYED_RELEASE_CAPSULE | Freq: Every day | ORAL | 3 refills | Status: DC

## 2023-10-10 MED ORDER — SERTRALINE HCL 50 MG PO TABS
50.0000 mg | ORAL_TABLET | Freq: Every day | ORAL | Status: DC
  Administered 2023-10-10 – 2023-10-13 (×4): 50 mg via ORAL
  Filled 2023-10-10 (×4): qty 1

## 2023-10-10 MED ORDER — FERROUS SULFATE 325 (65 FE) MG PO TBEC: 325.0000 mg | DELAYED_RELEASE_TABLET | Freq: Two times a day (BID) | ORAL | 3 refills | Status: DC

## 2023-10-10 MED ORDER — PANTOPRAZOLE SODIUM 40 MG IV SOLR
40.0000 mg | Freq: Two times a day (BID) | INTRAVENOUS | Status: DC
  Administered 2023-10-10 – 2023-10-13 (×7): 40 mg via INTRAVENOUS
  Filled 2023-10-10 (×7): qty 10

## 2023-10-10 MED ORDER — ACETAMINOPHEN 650 MG RE SUPP: 650.0000 mg | Freq: Four times a day (QID) | RECTAL | Status: DC | PRN

## 2023-10-10 MED ORDER — ONDANSETRON HCL 4 MG/2ML IJ SOLN
4.0000 mg | Freq: Four times a day (QID) | INTRAMUSCULAR | Status: DC | PRN
Start: 2023-10-10 — End: 2023-10-13

## 2023-10-10 MED ORDER — CARVEDILOL 3.125 MG PO TABS
6.2500 mg | ORAL_TABLET | Freq: Two times a day (BID) | ORAL | Status: DC
  Administered 2023-10-10 – 2023-10-13 (×6): 6.25 mg via ORAL
  Filled 2023-10-10 (×6): qty 2

## 2023-10-10 MED ORDER — SODIUM CHLORIDE 0.9% IV SOLUTION: Freq: Once | INTRAVENOUS | Status: AC

## 2023-10-10 MED ORDER — ADULT MULTIVITAMIN W/MINERALS CH
1.0000 | ORAL_TABLET | Freq: Every day | ORAL | Status: DC
  Administered 2023-10-11 – 2023-10-13 (×3): 1 via ORAL
  Filled 2023-10-10 (×3): qty 1

## 2023-10-10 NOTE — Telephone Encounter (Signed)
 Patient's wife came to front desk to pick up prep for patient. She also requested that Dr Sammi Crick call her at [phone]. Patient is in the ER at the moment and she needed to talk with him. I told her Dr Sammi Crick was not in the office today and I would send him a message to call.

## 2023-10-10 NOTE — Telephone Encounter (Signed)
 We have been reached by the ER, he will be admitted. Will talk today to her while hospitalized.

## 2023-10-10 NOTE — Hospital Course (Signed)
 Johnny Sullivan is a  68 year old male with a history of hypertension, hyperlipidemia, coronary artery disease status post CABG, cryptogenic stroke 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  During this period of time, the patient has had fatigue and some dyspnea on exertion.  History is supplemented by the patient's spouse at the bedside.  Patient has some difficulty communicating secondary to his stroke.  The patient has had some intermittent upper abdominal pain during this period of time.  He has not had any fevers, chills, chest pain, nausea, vomiting, diarrhea, hematochezia, hematemesis.  He denies any NSAIDs or any medications.  The patient went to see GI on 10/09/23 in the office.  Dr. Sammi Crick obtained routine blood work.  CBC showed hemoglobin 7.1 on 10/09/23.  The patient was instructed to go to the emergency department for further evaluation and treatment.  He presented to the ED on 10/10/2023 where hemoglobin was 7.2.  GI was consulted.  After discussion with the patient's family, it was felt in the patient's best interest to be admitted to the hospital for further evaluation and treatment including possible upper and lower endoscopy.  The patient was  hemodynamically stable in the ED.  WBC 8.5, hemoglobin 7.2, platelets 197.  Sodium 136, potassium 3.5, bicarbonate 26, serum creatinine 0.72.  INR 1.0.  GI was consulted assist with management.  1 unit PRBC was transfused.    Assessment and Plan:  Symptomatic anemia/heme positive stool - Presented with hemoglobin 7.1, 7.2, 7.0 - History.  Serial hemoglobin baseline ~10  - S/p EGD-Erythematous duodenopathy -otherwise within normal limits - Anticipating colonoscopy in a.m. 10/12/2023  - FOBT positive  - GI consulted - 1 unit PRBC given, 1 more unit PRBC today 10/11/2023 due to cardiac history: 8.0 - IV pantoprazole  bid    Latest Ref Rng & Units 10/11/2023    4:46 AM 10/10/2023    9:33 AM 10/09/2023    3:27 PM   CBC  WBC 4.0 - 10.5 K/uL 8.8  8.5  11.2   Hemoglobin 13.0 - 17.0 g/dL 7.0  7.2  7.1   Hematocrit 39.0 - 52.0 % 23.4  23.9  23.9   Platelets 150 - 400 K/uL 412  497  587         History of cryptogenic stroke - Previously on apixaban --last dose 10/04/23 - Holding apixaban  and antiplatelet therapy - Continue statin   Essential hypertension - holding amlodipine, hydrochlorothiazide  - restart lower dose coreg   Mixed hyperlipidemia - Continue statin   Coronary Artery disease with history of CABG - No chest pain presently

## 2023-10-10 NOTE — ED Notes (Signed)
 Pt tolerating infusion well. Lungs clear bilaterally and no rash noted to body. Pt is afebrile and denies pain. Kellogg RN

## 2023-10-10 NOTE — Care Management Obs Status (Signed)
 MEDICARE OBSERVATION STATUS NOTIFICATION   Patient Details  Name: Johnny Sullivan MRN: [id number] Date of Birth: 12-Sep-1955   Medicare Observation Status Notification Given:  Yes    Geraldina Klinefelter, RN 10/10/2023, 7:26 PM

## 2023-10-10 NOTE — Progress Notes (Signed)
   10/10/23 1943  TOC Brief Assessment  Insurance and Status Reviewed  Patient has primary care physician Yes  Home environment has been reviewed From home c/wife  Prior level of function: Independent  Prior/Current Home Services No current home services  Social Drivers of Health Review SDOH reviewed no interventions necessary  Readmission risk has been reviewed Yes  Transition of care needs no transition of care needs at this time   Transition of Care Department [facility]) has reviewed patient and no TOC needs have been identified at this time. We will continue to monitor patient advancement through interdisciplinary progression rounds. If new patient transition needs arise, please place a TOC consult.

## 2023-10-10 NOTE — H&P (Addendum)
 History and Physical    Patient: Johnny Sullivan ZOX:[id number] DOB: 02/13/1956 DOA: 10/10/2023 DOS: the patient was seen and examined on 10/10/2023 PCP: Sina Dudley, MD  Patient coming from: Home  Chief Complaint:  Chief Complaint  Patient presents with   GI Bleeding   HPI: Johnny Sullivan is a 68 year old male with a history of hypertension, hyperlipidemia, coronary artery disease status post CABG, cryptogenic stroke 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  During this period of time, the patient has had fatigue and some dyspnea on exertion.  History is supplemented by the patient's spouse at the bedside.  Patient has some difficulty communicating secondary to his stroke.  The patient has had some intermittent upper abdominal pain during this period of time.  He has not had any fevers, chills, chest pain, nausea, vomiting, diarrhea, hematochezia, hematemesis.  He denies any NSAIDs or any medications. The patient went to see GI on 10/09/23 in the office.  Dr. Sammi Crick obtained routine blood work.  CBC showed hemoglobin 7.1 on 10/09/23.  The patient was instructed to go to the emergency department for further evaluation and treatment.  He presented to the ED on 10/10/2023 where hemoglobin was 7.2.  GI was consulted.  After discussion with the patient's family, it was felt in the patient's best interest to be admitted to the hospital for further evaluation and treatment including possible upper and lower endoscopy. The patient was afebrile hemodynamically stable in the ED.  WBC 8.5, hemoglobin 7.2, platelets 197.  Sodium 136, potassium 3.5, bicarbonate 26, serum creatinine 0.72.  INR 1.0.  GI was consulted assist with management.  1 unit PRBC was transfused.  Review of Systems: As mentioned in the history of present illness. All other systems reviewed and are negative. Past Medical History:  Diagnosis Date   Arthritis    Osteoarthritis- right hip   Coronary  artery disease    GERD (gastroesophageal reflux disease)    Hypertension    Myocardial infarction (HCC)    " mild"-Dr. Zakhary,cardiolgy-[place] follows -gives clearance.   Stroke [facility])    Urethral stricture    'uses self [place] caths weekly" to correct.   Past Surgical History:  Procedure Laterality Date   BACK SURGERY     lumbar discectomy   CARDIAC CATHETERIZATION     CORONARY ARTERY BYPASS GRAFT     09-20-2012 x3 -[place]   IR PERCUTANEOUS ART THROMBECTOMY/INFUSION INTRACRANIAL INC DIAG ANGIO  09/26/2016   LOOP RECORDER INSERTION N/A 09/30/2016   Procedure: Loop Recorder Insertion;  Surgeon: Jolly Needle, MD;  Location: [facility] INVASIVE CV LAB;  Service: Cardiovascular;  Laterality: N/A;   RADIOLOGY WITH ANESTHESIA N/A 09/26/2016   Procedure: RADIOLOGY WITH ANESTHESIA;  Surgeon: Luellen Sages, MD;  Location: [facility] OR;  Service: Radiology;  Laterality: N/A;   TEE WITHOUT CARDIOVERSION N/A 09/30/2016   Procedure: TRANSESOPHAGEAL ECHOCARDIOGRAM (TEE);  Surgeon: Darlis Eisenmenger, MD;  Location: [facility] ENDOSCOPY;  Service: Cardiovascular;  Laterality: N/A;   TOTAL HIP ARTHROPLASTY Right 04/26/2016   Procedure: RIGHT TOTAL HIP ARTHROPLASTY ANTERIOR APPROACH;  Surgeon: Claiborne Crew, MD;  Location: [facility] ORS;  Service: Orthopedics;  Laterality: Right;   VASECTOMY     WRIST SURGERY     "implant of plastic bone"-mild ROM limits   Social History:  reports that he quit smoking about 11 years ago. His smoking use included cigarettes. He started smoking about 26 years ago. He has a 22.5 pack-year smoking history. He quit smokeless tobacco use about 11 years ago.  His smokeless tobacco use included chew. He reports current alcohol use. He reports that he does not use drugs.  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 Authorizing Provider  Acetaminophen  (MAPAP) 500 MG coapsule Take by mouth as needed.    [provider]  amLODipine (NORVASC) 10 MG tablet Take 10 mg by mouth daily.    [provider]  amoxicillin (AMOXIL) 500 MG capsule Take 2,000 mg by mouth See admin instructions. ONE HOUR PRIOR TO DENTAL APPOINTMENT 09/23/16   [provider]  apixaban  (ELIQUIS ) 5 MG TABS tablet Take 1 tablet (5 mg total) by mouth 2 (two) times daily. Patient not taking: Reported on 10/09/2023 04/03/17   Consuelo Denmark, MD  aspirin  EC 81 MG tablet Take 81 mg by mouth daily.    [provider]  atorvastatin  (LIPITOR ) 80 MG tablet Take 80 mg by mouth daily at 6 PM.  03/23/16   [provider]  carvedilol (COREG) 12.5 MG tablet Take 12.5 mg by mouth 2 (two) times daily with a meal.    [provider]  ferrous sulfate 325 (65 FE) MG EC tablet Take 1 tablet (325 mg total) by mouth in the morning and at bedtime. 10/10/23   Urban Garden, MD  hydrochlorothiazide  (MICROZIDE ) 12.5 MG capsule Take 12.5 mg by mouth daily.    [provider]  Multiple Vitamin (MULTIVITAMIN) capsule Take 1 capsule by mouth daily. For men > 50    [provider]  omeprazole (PRILOSEC) 40 MG capsule Take 1 capsule (40 mg total) by mouth daily. 10/10/23   Urban Garden, MD  sertraline (ZOLOFT) 50 MG tablet Take 50 mg by mouth daily. 06/03/17   [provider]    Physical Exam: Vitals:   10/10/23 1159 10/10/23 1200 10/10/23 1230 10/10/23 1350  BP: (!) 123/54 (!) 123/54 (!) 124/50 (!) 128/54  Pulse: 61  (!) 55 63  Resp:  19 13 16   Temp: 97.8 F (36.6 C) 97.8 F (36.6 C)  97.7 F (36.5 C)  TempSrc: Oral Oral  Oral  SpO2: 96% 96% 95% 94%  Weight:      Height:       GENERAL:  A&O x 3, NAD, well developed, cooperative, follows  commands HEENT: [place]/AT, No thrush, No icterus, No oral ulcers Neck:  No neck mass, No meningismus, soft, supple CV: RRR, no S3, no S4, no rub, no JVD Lungs:  CTA, no wheeze, no rhonchi, good air movement Abd: soft/NT +BS, nondistended Ext: No edema, no lymphangitis, no cyanosis, no rashes Neuro:  CN II-XII intact, strength 4/5 in RUE, RLE, strength 4/5 LUE, LLE; sensation intact bilateral; no dysmetria; babinski equivocal  Data Reviewed: Data reviewed above in history Assessment and Plan: Symptomatic anemia/heme positive stool - Presented with hemoglobin 7.2 - History.  Serial hemoglobin baseline ~10 - FOBT positive in the ED with brown stool - GI consulted - 1 unit PRBC given - IV pantoprazole  bid  History of cryptogenic stroke - Previously on apixaban --last dose 10/04/23 - Holding apixaban  and antiplatelet therapy - Continue statin  Essential hypertension - holding amlodipine, hydrochlorothiazide  - restart lower dose coreg  Mixed hyperlipidemia - Continue statin  Coronary Artery disease with history of CABG - No chest pain presently   Advance Care Planning: FULL  Consults: GI  Family Communication: spouse 10/10/23  Severity of Illness: The appropriate patient status for this patient is OBSERVATION. Observation status is judged to be reasonable and necessary in order to provide the required intensity of service to ensure the patient's safety. The patient's presenting symptoms, physical exam findings, and initial radiographic and laboratory data in the context of their medical condition is felt to place them at decreased risk for further clinical deterioration. Furthermore, it is anticipated that the patient will be medically stable for discharge from the hospital within 2 midnights of admission.   Author: Demaris Fillers, MD 10/10/2023 2:40 PM  For on call review [email or web address].

## 2023-10-10 NOTE — H&P (View-Only) (Signed)
 Subjective: Wife at bedside and assist with history as patient has aphasia. Reports he is feeling ok overall. No recurrent melena. No brbpr. No abdominal pain, nausea, vomiting. Denies NSAIDs. Last Eliquis  was 5/28.   Wife just got off the phone with PCP who told them patient's Hgb was 10.5 on 5/5. Hgb in the 11 range in November 2024. Previously in 12 range in 2023.   Objective: Vital signs in last 24 hours: Temp:  [97.1 F (36.2 C)-98.2 F (36.8 C)] 97.8 F (36.6 C) (06/03 1200) Pulse Rate:  [55-80] 55 (06/03 1230) Resp:  [13-21] 13 (06/03 1230) BP: (123-145)/(50-66) 124/50 (06/03 1230) SpO2:  [95 %-98 %] 95 % (06/03 1230) Weight:  [93.5 kg] 93.5 kg (06/03 0923)   General:   Alert and oriented, pleasant, NAD.  Head:  Normocephalic and atraumatic. Eyes:  No icterus, sclera clear. Conjuctiva pink.  Heart:  S1, S2 present, with systolic murmer.  Lungs: Clear to auscultation anteriorly.  Abdomen:  Bowel sounds present, soft, non-tender, non-distended. No rebound or guarding. No masses appreciated  Msk:  Symmetrical without gross deformities. Normal posture. Extremities:  Without edema. Neurologic:  Alert and  oriented, grossly normal neurologically. Skin:  Warm and dry, intact without significant lesions.  Psych:  Normal mood and affect.  Intake/Output from previous day: No intake/output data recorded. Intake/Output this shift: Total I/O In: 300 [Blood:300] Out: -   Lab Results: Recent Labs    10/09/23 1527 10/10/23 0933  WBC 11.2* 8.5  HGB 7.1* 7.2*  HCT 23.9* 23.9*  PLT 587* 497*   BMET Recent Labs    10/10/23 0933  NA 136  K 3.5  CL 103  CO2 26  GLUCOSE 116*  BUN 12  CREATININE 0.72  CALCIUM  8.9   LFT Recent Labs    10/10/23 0933  PROT 7.4  ALBUMIN 3.6  AST 17  ALT 13  ALKPHOS 121  BILITOT 0.6   PT/INR Recent Labs    10/10/23 0933  LABPROT 13.0  INR 1.0     Assessment: 68 y.o. male with past medical history of cryptogenic stroke,  GERD, hypertension, coronary artery disease status post CABG, myocardial infarction, who was seen in the GI office yesterday for evaluation of anemia. He reported some episodes of possible melena x 3 weeks that resolved after stopping Eliquis  on 5/28 with last episode of dark stool on 5/30.  Abdominal pain that also resolved at that time.  Denied NSAIDs.  History of heavy alcohol use, but none in 3 months.  He was scheduled for EGD and colonoscopy next week and started on omeprazole 40 mg daily, but blood work updated and showed hemoglobin of 7.1 with iron deficiency, so patient was advised to proceed to the ER for blood transfusion.  He presented to the ER today and was found to have a hemoglobin of 7.2.  We initially recommended transfusion with 1 unit PRBCs and to keep outpatient endoscopic evaluation as scheduled, but patient/family were uncomfortable with this due to significant drop in Hgb over the last 3 weeks from Hgb of 10.5 on 5/5 with PCP (per wife's report) and patient now being off Eliquis  for several days, so he was admitted for inpatient workup.    Plan: Agree with transfusing 1 unit PRBCs.  Monitor H/H and for overt GI bleeding.  Continue to hold Eliquis .  IV Pantoprazole  40 mg BID.  OK to have soft diet today.  NPO at midnight.  Proceed with upper endoscopy with Dr. Mordechai April tomorrow.  The risks, benefits, and alternatives have been discussed with the patient in detail. The patient states understanding and desires to proceed.  Will keep plans for outpatient colonoscopy next week.    LOS: 0 days    10/10/2023, 1:51 PM   Shana Daring, [facility]

## 2023-10-10 NOTE — Plan of Care (Signed)
   Problem: Education: Goal: Knowledge of General Education information will improve Description Including pain rating scale, medication(s)/side effects and non-pharmacologic comfort measures Outcome: Progressing   Problem: Clinical Measurements: Goal: Will remain free from infection Outcome: Progressing Goal: Diagnostic test results will improve Outcome: Progressing Goal: Cardiovascular complication will be avoided Outcome: Progressing   Problem: Activity: Goal: Risk for activity intolerance will decrease Outcome: Progressing

## 2023-10-10 NOTE — Progress Notes (Addendum)
 Subjective: Wife at bedside and assist with history as patient has aphasia. Reports he is feeling ok overall. No recurrent melena. No brbpr. No abdominal pain, nausea, vomiting. Denies NSAIDs. Last Eliquis  was 5/28.   Wife just got off the phone with PCP who told them patient's Hgb was 10.5 on 5/5. Hgb in the 11 range in November 2024. Previously in 12 range in 2023.   Objective: Vital signs in last 24 hours: Temp:  [97.1 F (36.2 C)-98.2 F (36.8 C)] 97.8 F (36.6 C) (06/03 1200) Pulse Rate:  [55-80] 55 (06/03 1230) Resp:  [13-21] 13 (06/03 1230) BP: (123-145)/(50-66) 124/50 (06/03 1230) SpO2:  [95 %-98 %] 95 % (06/03 1230) Weight:  [93.5 kg] 93.5 kg (06/03 0923)   General:   Alert and oriented, pleasant, NAD.  Head:  Normocephalic and atraumatic. Eyes:  No icterus, sclera clear. Conjuctiva pink.  Heart:  S1, S2 present, with systolic murmer.  Lungs: Clear to auscultation anteriorly.  Abdomen:  Bowel sounds present, soft, non-tender, non-distended. No rebound or guarding. No masses appreciated  Msk:  Symmetrical without gross deformities. Normal posture. Extremities:  Without edema. Neurologic:  Alert and  oriented, grossly normal neurologically. Skin:  Warm and dry, intact without significant lesions.  Psych:  Normal mood and affect.  Intake/Output from previous day: No intake/output data recorded. Intake/Output this shift: Total I/O In: 300 [Blood:300] Out: -   Lab Results: Recent Labs    10/09/23 1527 10/10/23 0933  WBC 11.2* 8.5  HGB 7.1* 7.2*  HCT 23.9* 23.9*  PLT 587* 497*   BMET Recent Labs    10/10/23 0933  NA 136  K 3.5  CL 103  CO2 26  GLUCOSE 116*  BUN 12  CREATININE 0.72  CALCIUM  8.9   LFT Recent Labs    10/10/23 0933  PROT 7.4  ALBUMIN 3.6  AST 17  ALT 13  ALKPHOS 121  BILITOT 0.6   PT/INR Recent Labs    10/10/23 0933  LABPROT 13.0  INR 1.0     Assessment: 68 y.o. male with past medical history of cryptogenic stroke,  GERD, hypertension, coronary artery disease status post CABG, myocardial infarction, who was seen in the GI office yesterday for evaluation of anemia. He reported some episodes of possible melena x 3 weeks that resolved after stopping Eliquis  on 5/28 with last episode of dark stool on 5/30.  Abdominal pain that also resolved at that time.  Denied NSAIDs.  History of heavy alcohol use, but none in 3 months.  He was scheduled for EGD and colonoscopy next week and started on omeprazole 40 mg daily, but blood work updated and showed hemoglobin of 7.1 with iron deficiency, so patient was advised to proceed to the ER for blood transfusion.  He presented to the ER today and was found to have a hemoglobin of 7.2.  We initially recommended transfusion with 1 unit PRBCs and to keep outpatient endoscopic evaluation as scheduled, but patient/family were uncomfortable with this due to significant drop in Hgb over the last 3 weeks from Hgb of 10.5 on 5/5 with PCP (per wife's report) and patient now being off Eliquis  for several days, so he was admitted for inpatient workup.    Plan: Agree with transfusing 1 unit PRBCs.  Monitor H/H and for overt GI bleeding.  Continue to hold Eliquis .  IV Pantoprazole  40 mg BID.  OK to have soft diet today.  NPO at midnight.  Proceed with upper endoscopy with Dr. Mordechai April tomorrow.  The risks, benefits, and alternatives have been discussed with the patient in detail. The patient states understanding and desires to proceed.  Will keep plans for outpatient colonoscopy next week.    LOS: 0 days    10/10/2023, 1:51 PM   Shana Daring, [facility]

## 2023-10-10 NOTE — ED Provider Notes (Signed)
 [place] EMERGENCY DEPARTMENT AT [facility] Provider Note   CSN: [id number] Arrival date & time: 10/10/23  8657     History  Chief Complaint  Patient presents with   GI Bleeding    Johnny Sullivan is a 68 y.o. male.  HPI Patient presents for melena.  Medical history includes CVA, HTN, HLD, CAD, arthritis, urethral stricture.  He was seen by GI yesterday.  For the past 3 weeks, patient has been having melena, described as black stools.  He has had associated symptoms of shortness of breath, fatigue.  He will have intermittent epigastric pain.  It does not seem to worsen after eating.  His wife thinks it might improve after eating sometimes.  He was seen by GI yesterday.  Lab work was obtained at the time.  His baseline hemoglobin is reportedly in the range of 10.  Hemoglobin from yesterday's lab work was 7.1.  He was called to inform of this result and advised to come to the ED.  Patient is on Eliquis  due to history of stroke.  He held his Eliquis  4 days ago due to the ongoing melena.  Since that time, his stool has become brown in color.  He denies any current abdominal discomfort.  His wife states that he is much more pale than normal.    Home Medications Prior to Admission medications   Medication Sig Start Date End Date Taking? Authorizing Provider  Acetaminophen  (MAPAP) 500 MG coapsule Take by mouth as needed.    [provider]  amLODipine (NORVASC) 10 MG tablet Take 10 mg by mouth daily.    [provider]  amoxicillin (AMOXIL) 500 MG capsule Take 2,000 mg by mouth See admin instructions. ONE HOUR PRIOR TO DENTAL APPOINTMENT 09/23/16   [provider]  apixaban  (ELIQUIS ) 5 MG TABS tablet Take 1 tablet (5 mg total) by mouth 2 (two) times daily. Patient not taking: Reported on 10/09/2023 04/03/17   Consuelo Denmark, MD  aspirin  EC 81 MG tablet Take 81 mg by mouth daily.    [provider]  atorvastatin  (LIPITOR ) 80 MG tablet Take 80 mg by  mouth daily at 6 PM.  03/23/16   [provider]  carvedilol (COREG) 12.5 MG tablet Take 12.5 mg by mouth 2 (two) times daily with a meal.    [provider]  ferrous sulfate 325 (65 FE) MG EC tablet Take 1 tablet (325 mg total) by mouth in the morning and at bedtime. 10/10/23   Castaneda Mayorga, Daniel, MD  hydrochlorothiazide  (MICROZIDE ) 12.5 MG capsule Take 12.5 mg by mouth daily.    [provider]  Multiple Vitamin (MULTIVITAMIN) capsule Take 1 capsule by mouth daily. For men > 50    [provider]  omeprazole (PRILOSEC) 40 MG capsule Take 1 capsule (40 mg total) by mouth daily. 10/10/23   Urban Garden, MD  sertraline (ZOLOFT) 50 MG tablet Take 50 mg by mouth daily. 06/03/17   [provider]      Allergies    Alteplase  and Vancomycin    Review of Systems   Review of Systems  Constitutional:  Positive for fatigue.  Respiratory:  Positive for shortness of breath.   Gastrointestinal:  Positive for abdominal pain and blood in stool.  All other systems reviewed and are negative.   Physical Exam Updated Vital Signs BP (!) 124/50   Pulse (!) 55   Temp 97.8 F (36.6 C) (Oral)   Resp 13   Ht 5'  10" (1.778 m)   Wt 93.5 kg   SpO2 95%   BMI 29.58 kg/m  Physical Exam Vitals and nursing note reviewed.  Constitutional:      General: He is not in acute distress.    Appearance: Normal appearance. He is well-developed. He is not ill-appearing, toxic-appearing or diaphoretic.  HENT:     Head: Normocephalic and atraumatic.     Right Ear: External ear normal.     Left Ear: External ear normal.     Nose: Nose normal.     Mouth/Throat:     Mouth: Mucous membranes are moist.  Eyes:     Extraocular Movements: Extraocular movements intact.     Conjunctiva/sclera: Conjunctivae normal.  Cardiovascular:     Rate and Rhythm: Normal rate and regular rhythm.  Pulmonary:     Effort: Pulmonary effort is normal. No respiratory distress.   Abdominal:     General: There is no distension.     Palpations: Abdomen is soft.     Tenderness: There is no abdominal tenderness.  Genitourinary:    Rectum: Guaiac result positive.  Musculoskeletal:        General: No swelling. Normal range of motion.     Cervical back: Normal range of motion and neck supple.  Skin:    General: Skin is warm and dry.     Capillary Refill: Capillary refill takes less than 2 seconds.     Coloration: Skin is pale. Skin is not jaundiced.  Neurological:     Mental Status: He is alert and oriented to person, place, and time. Mental status is at baseline.     Comments: Baseline aphasia.  Psychiatric:        Mood and Affect: Mood normal.        Behavior: Behavior normal.     ED Results / Procedures / Treatments   Labs (all labs ordered are listed, but only abnormal results are displayed) Labs Reviewed  COMPREHENSIVE METABOLIC PANEL WITH GFR - Abnormal; Notable for the following components:      Result Value   Glucose, Bld 116 (*)    All other components within normal limits  CBC WITH DIFFERENTIAL/PLATELET - Abnormal; Notable for the following components:   RBC 3.02 (*)    Hemoglobin 7.2 (*)    HCT 23.9 (*)    MCV 79.1 (*)    MCH 23.8 (*)    RDW 15.7 (*)    Platelets 497 (*)    All other components within normal limits  POC OCCULT BLOOD, ED - Abnormal  PROTIME-INR  TYPE AND SCREEN  PREPARE RBC (CROSSMATCH)    EKG None  Radiology No results found.  Procedures Procedures    Medications Ordered in ED Medications  pantoprazole  (PROTONIX ) injection 40 mg (40 mg Intravenous Given 10/10/23 0943)  0.9 %  sodium chloride  infusion (Manually program via Guardrails IV Fluids) (0 mLs Intravenous Stopped 10/10/23 1156)    ED Course/ Medical Decision Making/ A&P                                 Medical Decision Making Amount and/or Complexity of Data Reviewed Labs: ordered.  Risk Prescription drug management. Decision regarding  hospitalization.   This patient presents to the ED for concern of melena, this involves an extensive number of treatment options, and is a complaint that carries with it a high risk of complications and morbidity.  The differential diagnosis includes  GI bleed, symptomatic anemia   Co morbidities / Chronic conditions that complicate the patient evaluation  CVA, HTN, HLD, CAD, arthritis, urethral stricture   Additional history obtained:  Additional history obtained from EMR External records from outside source obtained and reviewed including patient's wife and daughter   Lab Tests:  I Ordered, and personally interpreted labs.  The pertinent results include: Acute on chronic anemia confirmed.  No leukocytosis is present.  Kidney function electrolytes are normal.  Cardiac Monitoring: / EKG:  The patient was maintained on a cardiac monitor.  I personally viewed and interpreted the cardiac monitored which showed an underlying rhythm of: Sinus rhythm   Problem List / ED Course / Critical interventions / Medication management  Patient presenting for symptomatic anemia in the setting of melena for the past 3 weeks.  He is typically on Eliquis  but has held this for the past 4 days.  Since then, his melena has improved.  He has ongoing fatigue and shortness of breath with exertion.  Hemoglobin from yesterday was 7.1.  Will repeat lab work today.  Patient was consented for blood transfusion.  DRE was performed with nurse chaperone present.  Patient's stool on exam was brown in color but was Hemoccult positive.  Protonix  was initiated.  GI was consulted.  I spoke with his gastroenterologist, Dr. Sammi Crick.  Dr. Elton Ham request at this time is for 1 unit PRBC transfusion.  He has upper and lower endoscopies planned for Tuesday, as outpatient.  He sent in a prescription for omeprazole for patient to take daily.  Patient to remain off of his Eliquis  for now.  Dose of hemoglobin today is 7.2.  1 unit  PRBCs was ordered.  I spoke with patient and wife about the plan.  They are in agreement.  I also spoke with his daughter over the telephone.  Daughter is an ICU nurse and she was updated.  Although she would prefer endoscopic today, she is comfortable with plan for Tuesday GI follow-up.  On reassessment, patient tolerating transfusion well.  His wife, at this point, voiced her concerns about discharge home with outpatient endoscopy next week.  She would prefer him to stay for inpatient upper endoscopy.  I spoke with Dr. Castaneda again who is agreeable to this.  Patient to be admitted for further management. I ordered medication including PRBCs for symptomatic anemia; Protonix  for presumed upper GI bleed. Reevaluation of the patient after these medicines showed that the patient improved I have reviewed the patients home medicines and have made adjustments as needed   Consultations Obtained:  I requested consultation with the gastroenterologist, Dr. Sammi Crick,  and discussed lab and imaging findings as well as pertinent plan - they recommend: 1 unit PRBC transfusion today with outpatient upper and lower endoscopy next week   Social Determinants of Health:  Aphasia at baseline but is able to perform all his own ADLs.        Final Clinical Impression(s) / ED Diagnoses Final diagnoses:  Melena  Symptomatic anemia    Rx / DC Orders ED Discharge Orders     None         Iva Mariner, MD 10/10/23 1352

## 2023-10-10 NOTE — ED Triage Notes (Signed)
 Pt arrived via POV from home c/o upper abdominal pain X 3 weeks, melena and was advised to seek treatment in the ER following recent lab work with his GI doctor. Pts Hgb 7.1. EDP present in Triage. Pt recently stopped taking Eliquis . Pt presents with expressive aphasia at baseline following previous stroke.

## 2023-10-10 NOTE — Telephone Encounter (Signed)
 Patient will have EGD tomorrow inpatient, so outpatient EGD can be canceled. Will need to keep colonoscopy in place as scheduled.

## 2023-10-10 NOTE — ED Notes (Signed)
 Prior to transfusion patient is alert and wife is at bedside. Per wife he is at baseline. Pt has aphasia and is able to mainly answer yes or no questions.  His lung sounds are clear bilaterally. No rash noted to body.  He denies pain. Kellogg RN

## 2023-10-10 NOTE — Telephone Encounter (Signed)
 Message sent to endo to cancel EGD

## 2023-10-11 ENCOUNTER — Observation Stay (HOSPITAL_COMMUNITY): Admitting: Certified Registered"

## 2023-10-11 ENCOUNTER — Observation Stay (HOSPITAL_COMMUNITY): Admitting: Anesthesiology

## 2023-10-11 ENCOUNTER — Encounter (HOSPITAL_COMMUNITY): Payer: Self-pay | Admitting: Internal Medicine

## 2023-10-11 ENCOUNTER — Encounter (HOSPITAL_COMMUNITY)
Admission: EM | Disposition: A | Discharge status: Home / Self Care | Payer: Self-pay | Source: Home / Self Care | Attending: Emergency Medicine

## 2023-10-11 DIAGNOSIS — D120 Benign neoplasm of cecum: Secondary | ICD-10-CM | POA: Diagnosis not present

## 2023-10-11 DIAGNOSIS — I2510 Atherosclerotic heart disease of native coronary artery without angina pectoris: Secondary | ICD-10-CM | POA: Diagnosis not present

## 2023-10-11 DIAGNOSIS — K5710 Diverticulosis of small intestine without perforation or abscess without bleeding: Secondary | ICD-10-CM | POA: Diagnosis not present

## 2023-10-11 DIAGNOSIS — K2980 Duodenitis without bleeding: Secondary | ICD-10-CM

## 2023-10-11 DIAGNOSIS — Z87891 Personal history of nicotine dependence: Secondary | ICD-10-CM

## 2023-10-11 DIAGNOSIS — K3189 Other diseases of stomach and duodenum: Secondary | ICD-10-CM | POA: Diagnosis not present

## 2023-10-11 DIAGNOSIS — D649 Anemia, unspecified: Secondary | ICD-10-CM | POA: Diagnosis not present

## 2023-10-11 HISTORY — PX: ESOPHAGOGASTRODUODENOSCOPY: SHX5428

## 2023-10-11 LAB — CBC
HCT: 23.4 % — ABNORMAL LOW (ref 39.0–52.0)
Hemoglobin: 7 g/dL — ABNORMAL LOW (ref 13.0–17.0)
MCH: 23.5 pg — ABNORMAL LOW (ref 26.0–34.0)
MCHC: 29.9 g/dL — ABNORMAL LOW (ref 30.0–36.0)
MCV: 78.5 fL — ABNORMAL LOW (ref 80.0–100.0)
Platelets: 412 10*3/uL — ABNORMAL HIGH (ref 150–400)
RBC: 2.98 MIL/uL — ABNORMAL LOW (ref 4.22–5.81)
RDW: 15.6 % — ABNORMAL HIGH (ref 11.5–15.5)
WBC: 8.8 10*3/uL (ref 4.0–10.5)
nRBC: 0 % (ref 0.0–0.2)

## 2023-10-11 LAB — BASIC METABOLIC PANEL WITH GFR
Anion gap: 5 (ref 5–15)
BUN: 11 mg/dL (ref 8–23)
CO2: 26 mmol/L (ref 22–32)
Calcium: 8.3 mg/dL — ABNORMAL LOW (ref 8.9–10.3)
Chloride: 107 mmol/L (ref 98–111)
Creatinine, Ser: 0.73 mg/dL (ref 0.61–1.24)
GFR, Estimated: >60 mL/min (ref >60)
Glucose, Bld: 105 mg/dL — ABNORMAL HIGH (ref 70–99)
Potassium: 3.8 mmol/L (ref 3.5–5.1)
Sodium: 138 mmol/L (ref 135–145)

## 2023-10-11 LAB — HIV ANTIBODY (ROUTINE TESTING W REFLEX): HIV Screen 4th Generation wRfx: NONREACTIVE

## 2023-10-11 LAB — PREPARE RBC (CROSSMATCH)

## 2023-10-11 SURGERY — EGD (ESOPHAGOGASTRODUODENOSCOPY)
Anesthesia: General

## 2023-10-11 SURGERY — CANCELLED PROCEDURE
Anesthesia: Choice

## 2023-10-11 MED ORDER — PROPOFOL 500 MG/50ML IV EMUL
INTRAVENOUS | Status: DC | PRN
Start: 2023-10-11 — End: 2023-10-11
  Administered 2023-10-11: 125 ug/kg/min via INTRAVENOUS

## 2023-10-11 MED ORDER — LACTATED RINGERS IV SOLN
INTRAVENOUS | Status: DC | PRN
Start: 2023-10-11 — End: 2023-10-11

## 2023-10-11 MED ORDER — LIDOCAINE 2% (20 MG/ML) 5 ML SYRINGE
INTRAMUSCULAR | Status: DC | PRN
  Administered 2023-10-11: 100 mg via INTRAVENOUS

## 2023-10-11 MED ORDER — SODIUM CHLORIDE 0.9% IV SOLUTION: Freq: Once | INTRAVENOUS | Status: AC

## 2023-10-11 MED ORDER — PHENYLEPHRINE 80 MCG/ML (10ML) SYRINGE FOR IV PUSH (FOR BLOOD PRESSURE SUPPORT)
PREFILLED_SYRINGE | INTRAVENOUS | Status: AC
  Filled 2023-10-11: qty 10

## 2023-10-11 MED ORDER — SODIUM CHLORIDE 0.9 % IV SOLN: INTRAVENOUS | Status: DC

## 2023-10-11 MED ORDER — PEG 3350-KCL-NA BICARB-NACL 420 G PO SOLR
4000.0000 mL | Freq: Once | ORAL | Status: AC
  Administered 2023-10-11: 4000 mL via ORAL

## 2023-10-11 MED ORDER — PHENYLEPHRINE 80 MCG/ML (10ML) SYRINGE FOR IV PUSH (FOR BLOOD PRESSURE SUPPORT)
PREFILLED_SYRINGE | INTRAVENOUS | Status: DC | PRN
  Administered 2023-10-11 (×2): 160 ug via INTRAVENOUS

## 2023-10-11 MED ORDER — PROPOFOL 10 MG/ML IV BOLUS
INTRAVENOUS | Status: DC | PRN
Start: 2023-10-11 — End: 2023-10-11
  Administered 2023-10-11: 100 mg via INTRAVENOUS

## 2023-10-11 MED ORDER — DEXMEDETOMIDINE HCL IN NACL 80 MCG/20ML IV SOLN
INTRAVENOUS | Status: DC | PRN
  Administered 2023-10-11: 8 ug via INTRAVENOUS

## 2023-10-11 MED ORDER — LIDOCAINE 2% (20 MG/ML) 5 ML SYRINGE
INTRAMUSCULAR | Status: AC
  Filled 2023-10-11: qty 5

## 2023-10-11 NOTE — Op Note (Signed)
 [facility] Patient Name: Johnny Sullivan Procedure Date: 10/11/2023 11:13 AM MRN: [id number] Date of Birth: [birth year] Attending MD: [name] , , [id number] CSN: [id number] Age: 68 Admit Type: Inpatient Procedure:                Upper GI endoscopy Indications:              Iron deficiency anemia, Melena Providers:                [name], Crystal Page, Sharlette Dayhoff,                            Technician Referring MD:              Medicines:                Monitored Anesthesia Care Complications:            No immediate complications. Estimated Blood Loss:     Estimated blood loss: none. Procedure:                Pre-Anesthesia Assessment:                           - Prior to the procedure, a History and Physical                            was performed, and patient medications, allergies                            and sensitivities were reviewed. The patient's                            tolerance of previous anesthesia was reviewed.                           - The risks and benefits of the procedure and the                            sedation options and risks were discussed with the                            patient. All questions were answered and informed                            consent was obtained.                           - ASA Grade Assessment: III - A patient with severe                            systemic disease.                           After obtaining informed consent, the endoscope was                            passed under direct vision. Throughout the  procedure, the patient's blood pressure, pulse, and                            oxygen saturations were monitored continuously. The                            [id number]-H190 ([id number]) scope was introduced through the                            mouth, and advanced to the third part of duodenum.                            The upper GI endoscopy was accomplished without                             difficulty. The patient tolerated the procedure                            well. Scope In: 11:32:32 AM Scope Out: 11:38:01 AM Total Procedure Duration: 0 hours 5 minutes 29 seconds  Findings:      The esophagus was normal.      The stomach was normal.      Localized moderately erythematous mucosa without active bleeding was       found in the first portion of the duodenum, around the duodenal sweep.       This was somewhat congested.      The rest of the inspected small bowel was normal. Impression:               - Normal esophagus.                           - Normal stomach.                           - Erythematous duodenopathy.                           - No specimens collected. Moderate Sedation:      Per Anesthesia Care Recommendation:           - Return patient to hospital ward for ongoing care.                           - Clear liquid diet today.                           - Will discuss with patient and wife undergoing                            colonoscopy tomorrow.                           - Transfuse one unit of PRBC today. Goal hemoglobin                            is >8.0.                           -  Daily H/H.                           - Continue PPI BID. Procedure Code(s):        --- Professional ---                           [phone], Esophagogastroduodenoscopy, flexible,                            transoral; diagnostic, including collection of                            specimen(s) by brushing or washing, when performed                            (separate procedure) Diagnosis Code(s):        --- Professional ---                           K31.89, Other diseases of stomach and duodenum                           D50.9, Iron deficiency anemia, unspecified                           K92.1, Melena (includes Hematochezia) CPT copyright 2022 American Medical Association. All rights reserved. The codes documented in this report are preliminary and upon  coder review may  be revised to meet current compliance requirements. [name], MD [name],  10/11/2023 12:14:29 PM This report has been signed electronically. Number of Addenda: 0

## 2023-10-11 NOTE — Anesthesia Preprocedure Evaluation (Signed)
 Anesthesia Evaluation  Patient identified by MRN, date of birth, ID band Patient awake    Reviewed: Allergy & Precautions, H&P , NPO status , Patient's Chart, lab work & pertinent test results, reviewed documented beta blocker date and time   Airway Mallampati: II  TM Distance: >3 FB Neck ROM: full    Dental no notable dental hx.    Pulmonary neg pulmonary ROS, former smoker   Pulmonary exam normal breath sounds clear to auscultation       Cardiovascular Exercise Tolerance: Good hypertension, + CAD and + Past MI  negative cardio ROS  Rhythm:regular Rate:Normal     Neuro/Psych CVA negative neurological ROS  negative psych ROS   GI/Hepatic negative GI ROS, Neg liver ROS,GERD  ,,  Endo/Other  negative endocrine ROS    Renal/GU negative Renal ROS  negative genitourinary   Musculoskeletal   Abdominal   Peds  Hematology negative hematology ROS (+) Blood dyscrasia, anemia   Anesthesia Other Findings   Reproductive/Obstetrics negative OB ROS                             Anesthesia Physical Anesthesia Plan  ASA: 3 and emergent  Anesthesia Plan: General   Post-op Pain Management:    Induction:   PONV Risk Score and Plan: Propofol  infusion  Airway Management Planned:   Additional Equipment:   Intra-op Plan:   Post-operative Plan:   Informed Consent: I have reviewed the patients History and Physical, chart, labs and discussed the procedure including the risks, benefits and alternatives for the proposed anesthesia with the patient or authorized representative who has indicated his/her understanding and acceptance.     Dental Advisory Given  Plan Discussed with: CRNA  Anesthesia Plan Comments:        Anesthesia Quick Evaluation

## 2023-10-11 NOTE — Transfer of Care (Signed)
 Immediate Anesthesia Transfer of Care Note  Patient: Johnny Sullivan  Procedure(s) Performed: EGD (ESOPHAGOGASTRODUODENOSCOPY)  Patient Location: PACU  Anesthesia Type:General  Level of Consciousness: drowsy  Airway & Oxygen Therapy: Patient Spontanous Breathing and Patient connected to nasal cannula oxygen  Post-op Assessment: Report given to RN and Post -op Vital signs reviewed and stable  Post vital signs: Reviewed and stable  Last Vitals:  Vitals Value Taken Time  BP    Temp    Pulse    Resp    SpO2      Last Pain:  Vitals:   10/11/23 1128  TempSrc:   PainSc: 0-No pain         Complications: No notable events documented.

## 2023-10-11 NOTE — Progress Notes (Signed)
 PROGRESS NOTE    Patient: Johnny Sullivan                            PCP: Sina Dudley, MD                    DOB: 1955-05-22            DOA: 10/10/2023 ZOX:[id number]             DOS: 10/11/2023, 11:58 AM   LOS: 0 days   Date of Service: The patient was seen and examined on 10/11/2023  Subjective:   The patient was seen and examined this morning. Mildly hypotensive otherwise hemodynamically stable S/p 1U PRBC hemoglobin 7.1, 7.2, 7.0 this morning N.p.o. 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  During this period of time, the patient has had fatigue and some dyspnea on exertion.  History is supplemented by the patient's spouse at the bedside.  Patient has some difficulty communicating secondary to his stroke.  The patient has had some intermittent upper abdominal pain during this period of time.  He has not had any fevers, chills, chest pain, nausea, vomiting, diarrhea, hematochezia, hematemesis.  He denies any NSAIDs or any medications.  The patient went to see GI on 10/09/23 in the office.  Dr. Sammi Crick obtained routine blood work.  CBC showed hemoglobin 7.1 on 10/09/23.  The patient was instructed to go to the emergency department for further evaluation and treatment.  He presented to the ED on 10/10/2023 where hemoglobin was 7.2.  GI was consulted.  After discussion with the patient's family, it was felt in the patient's best interest to be admitted to the hospital for further evaluation and treatment including possible upper and lower endoscopy.  The patient was  hemodynamically stable in the ED.  WBC 8.5, hemoglobin 7.2, platelets 197.  Sodium 136, potassium 3.5, bicarbonate 26, serum creatinine 0.72.  INR 1.0.  GI  was consulted assist with management.  1 unit PRBC was transfused.    Assessment and Plan:  Symptomatic anemia/heme positive stool - Presented with hemoglobin 7.1, 7.2, 7.0 - History.  Serial hemoglobin baseline ~10  - S/p EGD-Erythematous duodenopathy -otherwise within normal limits - Anticipating colonoscopy in a.m. 10/12/2023  - FOBT positive  - GI consulted - 1 unit PRBC given, 1 more unit PRBC today 10/11/2023 due to cardiac history: 8.0 - IV pantoprazole  bid    Latest Ref Rng & Units 10/11/2023    4:46 AM 10/10/2023    9:33 AM 10/09/2023    3:27 PM  CBC  WBC 4.0 - 10.5 K/uL 8.8  8.5  11.2   Hemoglobin 13.0 - 17.0 g/dL 7.0  7.2  7.1   Hematocrit 39.0 - 52.0 % 23.4  23.9  23.9   Platelets 150 - 400 K/uL 412  497  587         History of cryptogenic stroke - Previously on apixaban --last dose 10/04/23 - Holding apixaban  and antiplatelet therapy - Continue statin   Essential hypertension - holding amlodipine, hydrochlorothiazide  - restart lower dose coreg   Mixed hyperlipidemia - Continue statin   Coronary Artery disease with history of CABG - No chest pain presently  Assessment & Plan:     ------------------------------------------------------------------------------------------------------------------------------------------------  DVT prophylaxis:  SCDs Start: 10/10/23 1529   Code Status:   Code Status: Full Code  Family Communication: Wife present at bedside updated -Advance care planning has been discussed.   Admission status:   Status is: Observation The patient remains OBS appropriate and will d/c before 2 midnights.   Disposition: From  - home             Planning for discharge in 1-2 days: to   Procedures:   No admission procedures for hospital encounter.   Antimicrobials:  Anti-infectives (From admission, onward)    None        Medication:   sodium chloride    Intravenous Once   [MAR Hold] atorvastatin   80 mg Oral q1800   [MAR  Hold] carvedilol  6.25 mg Oral BID WC   [MAR Hold] multivitamin with minerals  1 tablet Oral Daily   [MAR Hold] pantoprazole  (PROTONIX ) IV  40 mg Intravenous Q12H   [MAR Hold] sertraline  50 mg Oral Daily    [MAR Hold] acetaminophen  **OR** [MAR Hold] acetaminophen , [MAR Hold] ondansetron  **OR** [MAR Hold] ondansetron  (ZOFRAN ) IV   Objective:   Vitals:   10/11/23 0112 10/11/23 0404 10/11/23 1055 10/11/23 1142  BP: (!) 114/45 (!) 121/53 (!) 120/57 (!) 99/53  Pulse: 67 64 60   Resp:  18 13 14   Temp:  97.8 F (36.6 C) 98 F (36.7 C) 97.7 F (36.5 C)  TempSrc:  Oral Oral   SpO2:  96% 99% 100%  Weight:      Height:        Intake/Output Summary (Last 24 hours) at 10/11/2023 1158 Last data filed at 10/11/2023 1143 Gross per 24 hour  Intake 823 ml  Output --  Net 823 ml   Filed Weights   10/10/23 0923 10/10/23 1519  Weight: 93.5 kg 92.4 kg     Physical examination:   Constitution:  Alert, cooperative, no distress,  Appears calm and comfortable  Psychiatric:   Normal and stable mood and affect, cognition intact,   HEENT:        Normocephalic, PERRL, otherwise with in Normal limits  Chest:         Chest symmetric Cardio vascular:  S1/S2, RRR, No murmure, No Rubs or Gallops  pulmonary: Clear to auscultation bilaterally, respirations unlabored, negative wheezes / crackles Abdomen: Soft, non-tender, non-distended, bowel sounds,no masses, no organomegaly Muscular skeletal: Limited exam - in bed, able to move all 4 extremities,   Neuro: CNII-XII intact. , normal motor and sensation, reflexes intact  Extremities: No pitting edema lower extremities, +2 pulses  Skin: Dry, warm to touch, negative for any Rashes, No open wounds Wounds: per nursing documentation   ------------------------------------------------------------------------------------------------------------------------------------------    LABs:     Latest Ref Rng & Units 10/11/2023    4:46 AM 10/10/2023    9:33 AM  10/09/2023    3:27 PM  CBC  WBC 4.0 - 10.5 K/uL 8.8  8.5  11.2   Hemoglobin 13.0 - 17.0 g/dL 7.0  7.2  7.1   Hematocrit 39.0 - 52.0 % 23.4  23.9  23.9   Platelets 150 - 400 K/uL 412  497  587       Latest Ref Rng & Units 10/11/2023    4:46 AM 10/10/2023    9:33 AM 09/30/2016    5:22 AM  CMP  Glucose 70 - 99 mg/dL 161  096  045   BUN 8 - 23  mg/dL 11  12  14    Creatinine 0.61 - 1.24 mg/dL 8.65  7.84  6.96   Sodium 135 - 145 mmol/L 138  136  140   Potassium 3.5 - 5.1 mmol/L 3.8  3.5  3.9   Chloride 98 - 111 mmol/L 107  103  104   CO2 22 - 32 mmol/L 26  26  26    Calcium  8.9 - 10.3 mg/dL 8.3  8.9  8.9   Total Protein 6.5 - 8.1 g/dL  7.4    Total Bilirubin 0.0 - 1.2 mg/dL  0.6    Alkaline Phos 38 - 126 U/L  121    AST 15 - 41 U/L  17    ALT 0 - 44 U/L  13         Micro Results No results found for this or any previous visit (from the past 240 hours).  Radiology Reports No results found.  SIGNED: Bobbetta Burnet, MD, FHM. FAAFP. Arlin Benes - [facility] Time spent - 55 min.  In seeing, evaluating and examining the patient. Reviewing medical records, labs, drawn plan of care. [facility],  Pager (please use [email or web address] to page/ text) Please use Epic Secure Chat for non-urgent communication (7AM-7PM)  If 7PM-7AM, please contact night-coverage [email or web address], 10/11/2023, 11:58 AM

## 2023-10-11 NOTE — Progress Notes (Signed)

## 2023-10-11 NOTE — Brief Op Note (Addendum)
 10/11/2023  11:47 AM  PATIENT:  Johnny Sullivan  68 y.o. male  PRE-OPERATIVE DIAGNOSIS:  melena, IDA  POST-OPERATIVE DIAGNOSIS:  duodenitis; small bowel diverticulum  PROCEDURE:  Procedure(s): EGD (ESOPHAGOGASTRODUODENOSCOPY) (N/A)  SURGEON:  Surgeons and Role:    * Urban Garden, MD - Primary  Patient underwent EGD under propofol  sedation.  Tolerated the procedure adequately.   FINDINGS: - Normal esophagus.  - Normal stomach.  - Erythematous duodenopathy.   RECOMMENDATIONS - Return patient to hospital ward for ongoing care.  - Clear liquid diet today.  - Will discuss with patient and wife undergoing colonoscopy tomorrow. - Transfuse one unit of PRBC today. Goal hemoglobin is >8.0. - Daily H/H. - Continue PPI BID.   [name], MD Gastroenterology and Hepatology [facility] Gastroenterology

## 2023-10-11 NOTE — Progress Notes (Addendum)
 Patient alert and oriented x4. Continues to have expressive aphasia st baseline post-cva in PMH but nods/gestures appropriately. Denies pain. Remains NPO. Changed into gown and started IV fluids. Had an 11-beat run of unsustained v-tach last night. Checked on patient and he denied symptoms. Reported v-tach and low bp to provider. Provider wanted to know if HGB had been done post blood transfusion completion which was around 1300 on yesterday. Explained that the day nurse said it was not done due to no order being in for it. No new orders given. Confirmed that surgical consent is signed and in chart. Additional needs denied.

## 2023-10-11 NOTE — Plan of Care (Signed)

## 2023-10-11 NOTE — Plan of Care (Signed)
   Problem: Education: Goal: Knowledge of General Education information will improve Description Including pain rating scale, medication(s)/side effects and non-pharmacologic comfort measures Outcome: Progressing   Problem: Health Behavior/Discharge Planning: Goal: Ability to manage health-related needs will improve Outcome: Progressing

## 2023-10-12 ENCOUNTER — Observation Stay (HOSPITAL_COMMUNITY): Admitting: Anesthesiology

## 2023-10-12 ENCOUNTER — Encounter (HOSPITAL_COMMUNITY)
Admission: EM | Disposition: A | Discharge status: Home / Self Care | Payer: Self-pay | Source: Home / Self Care | Attending: Emergency Medicine

## 2023-10-12 ENCOUNTER — Encounter (HOSPITAL_COMMUNITY): Payer: Self-pay | Admitting: Gastroenterology

## 2023-10-12 DIAGNOSIS — K5730 Diverticulosis of large intestine without perforation or abscess without bleeding: Secondary | ICD-10-CM

## 2023-10-12 DIAGNOSIS — I2510 Atherosclerotic heart disease of native coronary artery without angina pectoris: Secondary | ICD-10-CM

## 2023-10-12 DIAGNOSIS — D120 Benign neoplasm of cecum: Secondary | ICD-10-CM

## 2023-10-12 DIAGNOSIS — K648 Other hemorrhoids: Secondary | ICD-10-CM | POA: Diagnosis not present

## 2023-10-12 DIAGNOSIS — D649 Anemia, unspecified: Secondary | ICD-10-CM | POA: Diagnosis not present

## 2023-10-12 DIAGNOSIS — D509 Iron deficiency anemia, unspecified: Secondary | ICD-10-CM | POA: Diagnosis not present

## 2023-10-12 DIAGNOSIS — K635 Polyp of colon: Secondary | ICD-10-CM

## 2023-10-12 HISTORY — PX: COLONOSCOPY: SHX5424

## 2023-10-12 LAB — CBC WITH DIFFERENTIAL/PLATELET
Abs Immature Granulocytes: 0.03 10*3/uL (ref 0.00–0.07)
Basophils Absolute: 0 10*3/uL (ref 0.0–0.1)
Basophils Relative: 1 %
Eosinophils Absolute: 0.2 10*3/uL (ref 0.0–0.5)
Eosinophils Relative: 2 %
HCT: 26.5 % — ABNORMAL LOW (ref 39.0–52.0)
Hemoglobin: 7.9 g/dL — ABNORMAL LOW (ref 13.0–17.0)
Immature Granulocytes: 0 %
Lymphocytes Relative: 19 %
Lymphs Abs: 1.5 10*3/uL (ref 0.7–4.0)
MCH: 23.5 pg — ABNORMAL LOW (ref 26.0–34.0)
MCHC: 29.8 g/dL — ABNORMAL LOW (ref 30.0–36.0)
MCV: 78.9 fL — ABNORMAL LOW (ref 80.0–100.0)
Monocytes Absolute: 0.5 10*3/uL (ref 0.1–1.0)
Monocytes Relative: 7 %
Neutro Abs: 5.7 10*3/uL (ref 1.7–7.7)
Neutrophils Relative %: 71 %
Platelets: 402 10*3/uL — ABNORMAL HIGH (ref 150–400)
RBC: 3.36 MIL/uL — ABNORMAL LOW (ref 4.22–5.81)
RDW: 15.6 % — ABNORMAL HIGH (ref 11.5–15.5)
WBC: 8 10*3/uL (ref 4.0–10.5)
nRBC: 0 % (ref 0.0–0.2)

## 2023-10-12 LAB — GLUCOSE, CAPILLARY: Glucose-Capillary: 84 mg/dL (ref 70–99)

## 2023-10-12 LAB — PREPARE RBC (CROSSMATCH)

## 2023-10-12 SURGERY — COLONOSCOPY
Anesthesia: General

## 2023-10-12 MED ORDER — PROPOFOL 500 MG/50ML IV EMUL
INTRAVENOUS | Status: DC | PRN
  Administered 2023-10-12: 200 ug/kg/min via INTRAVENOUS

## 2023-10-12 MED ORDER — PROPOFOL 10 MG/ML IV BOLUS
INTRAVENOUS | Status: DC | PRN
  Administered 2023-10-12: 100 mg via INTRAVENOUS

## 2023-10-12 MED ORDER — SODIUM CHLORIDE 0.9% IV SOLUTION: Freq: Once | INTRAVENOUS | Status: AC

## 2023-10-12 MED ORDER — LACTATED RINGERS IV SOLN: INTRAVENOUS | Status: DC | PRN

## 2023-10-12 NOTE — Brief Op Note (Signed)
 Patient underwent Colonoscopy under propofol  sedation.  Tolerated the procedure adequately.   FINDINGS:  - One 7 mm polyp in the cecum, removed with a cold snare. Resected and retrieved.   - Severe Diverticulosis in the entire examined colon.   - The examined portion of the ileum was normal.   - Non- bleeding internal hemorrhoids.   - No evidence of old or active bleeding in the entire examined colon and terminal ileum  RECOMMENDATIONS  - Resume diet as tolerated .   - Continue present medications.   - Await pathology results.   - Repeat colonoscopy in 3 years for surveillance given fair prep .   - No absolute GI contraindication to restarting clinically indicated anticoagulation today while patient is closely monitored   - Would benefit from capsule endoscopy for completion of Iron deficiency anemia workup as outpatient   Sondos Wolfman Faizan Oseias Horsey, MD Gastroenterology and Hepatology [facility] Gastroenterology

## 2023-10-12 NOTE — Op Note (Addendum)
 [facility] Patient Name: Johnny Sullivan Procedure Date: 10/12/2023 1:19 PM MRN: [id number] Date of Birth: Apr 18, 1956 Attending MD: Terril Fetters , MD, [id number] CSN: [id number] Age: 68 Admit Type: Inpatient Procedure:                Colonoscopy Indications:              Melena, Unexplained iron deficiency anemia Providers:                Terril Fetters, MD, Alisa App, Vonna Guardian,                            Annell Barrow Referring MD:              Medicines:                Monitored Anesthesia Care Complications:            No immediate complications. Estimated Blood Loss:     Estimated blood loss was minimal. Procedure:                Pre-Anesthesia Assessment:                           - Prior to the procedure, a History and Physical                            was performed, and patient medications and                            allergies were reviewed. The patient's tolerance of                            previous anesthesia was also reviewed. The risks                            and benefits of the procedure and the sedation                            options and risks were discussed with the patient.                            All questions were answered, and informed consent                            was obtained. Prior Anticoagulants: The patient has                            taken Eliquis  (apixaban ), last dose was 5 days                            prior to procedure. ASA Grade Assessment: III - A                            patient with severe systemic disease. After  reviewing the risks and benefits, the patient was                            deemed in satisfactory condition to undergo the                            procedure.                           After obtaining informed consent, the colonoscope                            was passed under direct vision. Throughout the                            procedure, the patient's blood  pressure, pulse, and                            oxygen saturations were monitored continuously. The                            [id number]) scope was introduced through the                            anus and advanced to the the terminal ileum. The                            colonoscopy was performed without difficulty. The                            patient tolerated the procedure well. The quality                            of the bowel preparation was evaluated using the                            BBPS [facility] Bowel Preparation Scale) with scores                            of: Right Colon = 2 (minor amount of residual                            staining, small fragments of stool and/or opaque                            liquid, but mucosa seen well), Transverse Colon = 2                            (minor amount of residual staining, small fragments                            of stool and/or opaque liquid, but mucosa seen  well) and Left Colon = 2 (minor amount of residual                            staining, small fragments of stool and/or opaque                            liquid, but mucosa seen well). The total BBPS score                            equals 6. Scope In: 1:33:44 PM Scope Out: 2:00:40 PM Scope Withdrawal Time: 0 hours 21 minutes 56 seconds  Total Procedure Duration: 0 hours 26 minutes 56 seconds  Findings:      A 7 mm polyp was found in the cecum. The polyp was sessile. The polyp       was removed with a cold snare. Resection and retrieval were complete.      Scattered large-mouthed diverticula were found in the entire colon.      There is no endoscopic evidence of bleeding in the entire colon.      The terminal ileum appeared normal.      Non-bleeding internal hemorrhoids were found during retroflexion. The       hemorrhoids were small. Impression:               - One 7 mm polyp in the cecum, removed with a cold                             snare. Resected and retrieved.                           - Severe Diverticulosis in the entire examined                            colon.                           - The examined portion of the ileum was normal.                           - Non-bleeding internal hemorrhoids.                           - No evidence of old or active bleeding in the                            entire examined colon and terminal ileum Moderate Sedation:      Per Anesthesia Care Recommendation:           - Resume previous diet.                           - Continue present medications.                           - Await pathology results.                           -  Repeat colonoscopy in 3 years for surveillance                            given fair prep .                           -No absolute GI contraindication to restarting                            clinically indicated anticoagulation today while                            patient is closely monitored                           -Would benefit from capsule endoscopy for                            completion of Iron deficiency anemia workup as                            outpatient Procedure Code(s):        --- Professional ---                           [phone], Colonoscopy, flexible; with removal of                            tumor(s), polyp(s), or other lesion(s) by snare                            technique Diagnosis Code(s):        --- Professional ---                           D12.0, Benign neoplasm of cecum                           K64.8, Other hemorrhoids                           K92.1, Melena (includes Hematochezia)                           D50.9, Iron deficiency anemia, unspecified                           K57.30, Diverticulosis of large intestine without                            perforation or abscess without bleeding CPT copyright 2022 American Medical Association. All rights reserved. The codes documented in this report are preliminary and  upon coder review may  be revised to meet current compliance requirements. Terril Fetters, MD Terril Fetters, MD 10/12/2023 2:19:31 PM This report has been signed electronically. Number of Addenda: 0

## 2023-10-12 NOTE — Anesthesia Preprocedure Evaluation (Signed)
 Anesthesia Evaluation  Patient identified by MRN, date of birth, ID band Patient awake    Reviewed: Allergy & Precautions, H&P , NPO status , Patient's Chart, lab work & pertinent test results, reviewed documented beta blocker date and time   Airway Mallampati: II  TM Distance: >3 FB Neck ROM: full    Dental no notable dental hx.    Pulmonary neg pulmonary ROS, former smoker   Pulmonary exam normal breath sounds clear to auscultation       Cardiovascular Exercise Tolerance: Good hypertension, + CAD and + Past MI  negative cardio ROS  Rhythm:regular Rate:Normal     Neuro/Psych CVA negative neurological ROS  negative psych ROS   GI/Hepatic negative GI ROS, Neg liver ROS,GERD  ,,  Endo/Other  negative endocrine ROS    Renal/GU negative Renal ROS  negative genitourinary   Musculoskeletal   Abdominal   Peds  Hematology negative hematology ROS (+) Blood dyscrasia, anemia   Anesthesia Other Findings   Reproductive/Obstetrics negative OB ROS                             Anesthesia Physical Anesthesia Plan  ASA: 3 and emergent  Anesthesia Plan: General   Post-op Pain Management:    Induction:   PONV Risk Score and Plan: Propofol  infusion  Airway Management Planned:   Additional Equipment:   Intra-op Plan:   Post-operative Plan:   Informed Consent: I have reviewed the patients History and Physical, chart, labs and discussed the procedure including the risks, benefits and alternatives for the proposed anesthesia with the patient or authorized representative who has indicated his/her understanding and acceptance.     Dental Advisory Given  Plan Discussed with: CRNA  Anesthesia Plan Comments:        Anesthesia Quick Evaluation

## 2023-10-12 NOTE — Anesthesia Postprocedure Evaluation (Signed)
 Anesthesia Post Note  Patient: Johnny Sullivan  Procedure(s) Performed: COLONOSCOPY  Patient location during evaluation: Phase II Anesthesia Type: General Level of consciousness: awake Pain management: pain level controlled Vital Signs Assessment: post-procedure vital signs reviewed and stable Respiratory status: spontaneous breathing and respiratory function stable Cardiovascular status: blood pressure returned to baseline and stable Postop Assessment: no headache and no apparent nausea or vomiting Anesthetic complications: no Comments: Late entry   No notable events documented.   Last Vitals:  Vitals:   10/12/23 1430 10/12/23 1640  BP: 127/62 (!) 149/59  Pulse: 68 (!) 59  Resp: 17   Temp: 36.4 C   SpO2: 98%     Last Pain:  Vitals:   10/12/23 1430  TempSrc:   PainSc: 0-No pain                 Coretha Dew

## 2023-10-12 NOTE — Interval H&P Note (Signed)
 History and Physical Interval Note:  10/12/2023 12:58 PM  Johnny Sullivan  has presented today for surgery, with the diagnosis of anemia, melena.  The various methods of treatment have been discussed with the patient and family. After consideration of risks, benefits and other options for treatment, the patient has consented to  Procedure(s): COLONOSCOPY (N/A) as a surgical intervention.  The patient's history has been reviewed, patient examined, no change in status, stable for surgery.  I have reviewed the patient's chart and labs.  Questions were answered to the patient's satisfaction.     Forbes Ida Baljit Liebert

## 2023-10-12 NOTE — Transfer of Care (Signed)
 Immediate Anesthesia Transfer of Care Note  Patient: Johnny Sullivan  Procedure(s) Performed: COLONOSCOPY  Patient Location: PACU  Anesthesia Type:General  Level of Consciousness: awake, alert , oriented, and patient cooperative  Airway & Oxygen Therapy: Patient Spontanous Breathing and Patient connected to nasal cannula oxygen  Post-op Assessment: Report given to RN, Post -op Vital signs reviewed and stable, and Patient moving all extremities X 4  Post vital signs: Reviewed and stable  Last Vitals:  Vitals Value Taken Time  BP 104/51 10/12/23 1405  Temp 36.8 C 10/12/23 1405  Pulse 73 10/12/23 1410  Resp 26 10/12/23 1410  SpO2 96 % 10/12/23 1410  Vitals shown include unfiled device data.  Last Pain:  Vitals:   10/12/23 1325  TempSrc:   PainSc: 0-No pain         Complications: No notable events documented.

## 2023-10-12 NOTE — Plan of Care (Signed)

## 2023-10-12 NOTE — Progress Notes (Signed)
 PROGRESS NOTE    Patient: Johnny Sullivan                            PCP: Sina Dudley, MD                    DOB: 11/11/1955            DOA: 10/10/2023 [id number]             DOS: 10/12/2023, 12:28 PM   LOS: 0 days   Date of Service: The patient was seen and examined on 10/12/2023  Subjective:   The patient was seen and examined this morning, stable no acute distress - Bowel prep overnight in anticipation of colonoscopy today Tolerated EGD well Hemoglobin proved to 7.9  Brief Narrative:   Johnny Sullivan is a  68 year old male with a history of hypertension, hyperlipidemia, coronary artery disease status post CABG, cryptogenic stroke 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  During this period of time, the patient has had fatigue and some dyspnea on exertion.  History is supplemented by the patient's spouse at the bedside.  Patient has some difficulty communicating secondary to his stroke.  The patient has had some intermittent upper abdominal pain during this period of time.  He has not had any fevers, chills, chest pain, nausea, vomiting, diarrhea, hematochezia, hematemesis.  He denies any NSAIDs or any medications.  The patient went to see GI on 10/09/23 in the office.  Dr. Sammi Crick obtained routine blood work.  CBC showed hemoglobin 7.1 on 10/09/23.  The patient was instructed to go to the emergency department for further evaluation and treatment.  He presented to the ED on 10/10/2023 where hemoglobin was 7.2.  GI was consulted.  After discussion with the patient's family, it was felt in the patient's best interest to be admitted to the hospital for further evaluation and treatment including possible upper and lower endoscopy.  The patient was  hemodynamically stable in the ED.  WBC 8.5, hemoglobin 7.2, platelets 197.  Sodium 136, potassium 3.5, bicarbonate 26, serum creatinine 0.72.  INR 1.0.  GI was consulted assist with management.  1 unit PRBC  was transfused.    Assessment and Plan:  Symptomatic anemia/heme positive stool - Presented with hemoglobin 7.1, 7.2, 7.0 > 1U PRBC>> 7 point - History.  Serial hemoglobin baseline ~10  - S/p EGD-Erythematous duodenopathy -otherwise within normal limits - Anticipating colonoscopy in a.m. 10/12/2023  - FOBT positive  - GI consulted - 1 unit PRBC given, maintain hemoglobin close to 8.0 due to cardiac history - IV pantoprazole  bid    Latest Ref Rng & Units 10/12/2023    8:11 AM 10/11/2023    4:46 AM 10/10/2023    9:33 AM  CBC  WBC 4.0 - 10.5 K/uL 8.0  8.8  8.5   Hemoglobin 13.0 - 17.0 g/dL 7.9  7.0  7.2   Hematocrit 39.0 - 52.0 % 26.5  23.4  23.9   Platelets 150 - 400 K/uL 402  412  497    10/12/2023, overnight prep and anticipation of colonoscopy today      History of cryptogenic stroke - Previously on apixaban --last dose 10/04/23 - Holding apixaban  and antiplatelet therapy - Continue statin   Essential hypertension - holding amlodipine, hydrochlorothiazide  - restart lower dose coreg   Mixed hyperlipidemia - Continue statin   Coronary Artery disease with history of CABG - No chest pain  presently    ------------------------------------------------------------------------------------------------------------------------------------------------  DVT prophylaxis:  SCDs Start: 10/10/23 1529   Code Status:   Code Status: Full Code  Family Communication: Wife present at bedside updated -Advance care planning has been discussed.   Admission status:   Status is: Observation The patient remains OBS appropriate and will d/c before 2 midnights.   Disposition: From  - home             Planning for discharge in 1-2 days: to   Procedures:   No admission procedures for hospital encounter.   Antimicrobials:  Anti-infectives (From admission, onward)    None        Medication:   atorvastatin   80 mg Oral q1800   carvedilol  6.25 mg Oral BID WC   multivitamin with  minerals  1 tablet Oral Daily   pantoprazole  (PROTONIX ) IV  40 mg Intravenous Q12H   sertraline  50 mg Oral Daily    acetaminophen  **OR** acetaminophen , ondansetron  **OR** ondansetron  (ZOFRAN ) IV   Objective:   Vitals:   10/12/23 0845 10/12/23 1028 10/12/23 1055 10/12/23 1158  BP: 129/84 (!) 124/59 (!) 133/50 (!) 137/55  Pulse: 67 62 62 64  Resp:  20 18 18   Temp:  98.3 F (36.8 C) 98 F (36.7 C) 98.3 F (36.8 C)  TempSrc:  Oral  Oral  SpO2:  98%  97%  Weight:      Height:        Intake/Output Summary (Last 24 hours) at 10/12/2023 1228 Last data filed at 10/12/2023 1040 Gross per 24 hour  Intake 852.14 ml  Output --  Net 852.14 ml   Filed Weights   10/10/23 0923 10/10/23 1519  Weight: 93.5 kg 92.4 kg     Physical examination:       General:  AAO x 3,  cooperative, no distress;   HEENT:  Normocephalic, PERRL, otherwise with in Normal limits   Neuro:  Dysarthric - CNII-XII intact. , normal motor and sensation, reflexes intact   Lungs:   Clear to auscultation BL, Respirations unlabored,  No wheezes / crackles  Cardio:    S1/S2, RRR, No murmure, No Rubs or Gallops   Abdomen:  Soft, non-tender, bowel sounds active all four quadrants, no guarding or peritoneal signs.  Muscular  skeletal:  Limited exam -global generalized weaknesses - in bed, able to move all 4 extremities,   2+ pulses,  symmetric, No pitting edema  Skin:  Dry, warm to touch, negative for any Rashes,  Wounds: Please see nursing documentation          ------------------------------------------------------------------------------------------------------------------------------------------    LABs:     Latest Ref Rng & Units 10/12/2023    8:11 AM 10/11/2023    4:46 AM 10/10/2023    9:33 AM  CBC  WBC 4.0 - 10.5 K/uL 8.0  8.8  8.5   Hemoglobin 13.0 - 17.0 g/dL 7.9  7.0  7.2   Hematocrit 39.0 - 52.0 % 26.5  23.4  23.9   Platelets 150 - 400 K/uL 402  412  497       Latest Ref Rng & Units 10/11/2023     4:46 AM 10/10/2023    9:33 AM 09/30/2016    5:22 AM  CMP  Glucose 70 - 99 mg/dL 161  096  045   BUN 8 - 23 mg/dL 11  12  14    Creatinine 0.61 - 1.24 mg/dL 4.09  8.11  9.14   Sodium 135 - 145 mmol/L 138  136  140   Potassium 3.5 - 5.1 mmol/L 3.8  3.5  3.9   Chloride 98 - 111 mmol/L 107  103  104   CO2 22 - 32 mmol/L 26  26  26    Calcium  8.9 - 10.3 mg/dL 8.3  8.9  8.9   Total Protein 6.5 - 8.1 g/dL  7.4    Total Bilirubin 0.0 - 1.2 mg/dL  0.6    Alkaline Phos 38 - 126 U/L  121    AST 15 - 41 U/L  17    ALT 0 - 44 U/L  13         Micro Results No results found for this or any previous visit (from the past 240 hours).  Radiology Reports No results found.  SIGNED: Bobbetta Burnet, MD, FHM. FAAFP. Arlin Benes - [facility] Time spent - 55 min.  In seeing, evaluating and examining the patient. Reviewing medical records, labs, drawn plan of care. [facility],  Pager (please use [email or web address] to page/ text) Please use Epic Secure Chat for non-urgent communication (7AM-7PM)  If 7PM-7AM, please contact night-coverage [email or web address], 10/12/2023, 12:28 PM

## 2023-10-13 ENCOUNTER — Encounter (HOSPITAL_COMMUNITY): Payer: Self-pay | Admitting: Gastroenterology

## 2023-10-13 ENCOUNTER — Other Ambulatory Visit (HOSPITAL_COMMUNITY)

## 2023-10-13 ENCOUNTER — Telehealth: Payer: Self-pay | Admitting: Gastroenterology

## 2023-10-13 DIAGNOSIS — D509 Iron deficiency anemia, unspecified: Secondary | ICD-10-CM | POA: Diagnosis not present

## 2023-10-13 DIAGNOSIS — D120 Benign neoplasm of cecum: Secondary | ICD-10-CM | POA: Diagnosis not present

## 2023-10-13 LAB — BPAM RBC
Blood Product Expiration Date: 202506252359
Blood Product Expiration Date: 202506242359
Blood Product Expiration Date: 202506242359
ISSUE DATE / TIME: 202506051034
ISSUE DATE / TIME: 202506031134
ISSUE DATE / TIME: 202506041243
Unit Type and Rh: 5100
Unit Type and Rh: 5100
Unit Type and Rh: 5100

## 2023-10-13 LAB — CBC
HCT: 31.9 % — ABNORMAL LOW (ref 39.0–52.0)
Hemoglobin: 9.8 g/dL — ABNORMAL LOW (ref 13.0–17.0)
MCH: 24.6 pg — ABNORMAL LOW (ref 26.0–34.0)
MCHC: 30.7 g/dL (ref 30.0–36.0)
MCV: 80.2 fL (ref 80.0–100.0)
Platelets: 434 10*3/uL — ABNORMAL HIGH (ref 150–400)
RBC: 3.98 MIL/uL — ABNORMAL LOW (ref 4.22–5.81)
RDW: 15.9 % — ABNORMAL HIGH (ref 11.5–15.5)
WBC: 10.2 10*3/uL (ref 4.0–10.5)
nRBC: 0 % (ref 0.0–0.2)

## 2023-10-13 LAB — TYPE AND SCREEN
ABO/RH(D): A POS
Antibody Screen: NEGATIVE
Unit division: 0
Unit division: 0
Unit division: 0

## 2023-10-13 LAB — SURGICAL PATHOLOGY

## 2023-10-13 MED ORDER — DOCUSATE SODIUM 100 MG PO CAPS: 100.0000 mg | ORAL_CAPSULE | Freq: Two times a day (BID) | ORAL | 2 refills | Status: DC

## 2023-10-13 MED ORDER — CARVEDILOL 6.25 MG PO TABS: 6.2500 mg | ORAL_TABLET | Freq: Two times a day (BID) | ORAL | 2 refills | Status: DC

## 2023-10-13 MED ORDER — PANTOPRAZOLE SODIUM 40 MG PO TBEC: 40.0000 mg | DELAYED_RELEASE_TABLET | Freq: Two times a day (BID) | ORAL | 1 refills | Status: DC

## 2023-10-13 MED ORDER — FERROUS SULFATE 325 (65 FE) MG PO TBEC: 325.0000 mg | DELAYED_RELEASE_TABLET | Freq: Every day | ORAL | 3 refills | Status: DC

## 2023-10-13 NOTE — Anesthesia Postprocedure Evaluation (Signed)
 Anesthesia Post Note  Patient: Johnny Sullivan  Procedure(s) Performed: EGD (ESOPHAGOGASTRODUODENOSCOPY)  Patient location during evaluation: Phase II Anesthesia Type: General Level of consciousness: awake Pain management: pain level controlled Vital Signs Assessment: post-procedure vital signs reviewed and stable Respiratory status: spontaneous breathing and respiratory function stable Cardiovascular status: blood pressure returned to baseline and stable Postop Assessment: no headache and no apparent nausea or vomiting Anesthetic complications: no Comments: Late entry   No notable events documented.   Last Vitals:  Vitals:   10/13/23 0528 10/13/23 0841  BP: (!) 139/59 (!) 112/51  Pulse: 69 78  Resp: 16   Temp: 36.9 C   SpO2: 94%     Last Pain:  Vitals:   10/13/23 1013  TempSrc:   PainSc: 0-No pain                 Coretha Dew

## 2023-10-13 NOTE — Telephone Encounter (Signed)
 Tanya/Mindy/Tammy -please schedule capsule study for Dr. Sammi Crick. Please let them know to stop iron 1 week prior to procedure or immediately pending timing.   Crystal -please arrange CBC for the patient in 1 week (6/12-6/13) for [facility].  Diagnosis: Anemia.  Amalia Badder -please arrange hospital follow-up for the patient in 3-4 weeks.  Please make this preferably with Dr. Castaneda or if no availability can be with Starling Eck or myself who only briefly saw him in the hospital.

## 2023-10-13 NOTE — Progress Notes (Signed)
 Gastroenterology Progress Note   Referring Provider: No ref. provider found Primary Care Physician:  Sina Dudley, MD Primary Gastroenterologist:  Urban Garden, MD  Patient ID: Johnny Sullivan; [id number]; 05-11-1955   Subjective   Denies any abdominal pain, nausea, vomiting, melena, BRBPR.  Has not had a bowel movement since his prep/colonoscopy. Tolerating diet.  Patient states he is ready to go home.  Objective   Vital signs in last 24 hours Temp:  [97.6 F (36.4 C)-98.9 F (37.2 C)] 98.5 F (36.9 C) (06/06 0528) Pulse Rate:  [59-79] 78 (06/06 0841) Resp:  [16-25] 16 (06/06 0528) BP: (104-149)/(50-98) 112/51 (06/06 0841) SpO2:  [94 %-98 %] 94 % (06/06 0528) Last BM Date : 10/12/23  Physical Exam General:   Alert and oriented, pleasant. Head:  Normocephalic and atraumatic. Eyes:  No icterus, sclera clear. Conjuctiva pink.  Mouth:  Without lesions, mucosa pink and moist.  Neck:  Supple, without thyromegaly or masses.  Abdomen:  Bowel sounds present, soft, non-tender, non-distended. No HSM or hernias noted. No rebound or guarding. No masses appreciated  Extremities:  Without clubbing or edema. Neurologic:  Alert and oriented x4; aphasic. Psych:  Alert and cooperative. Normal mood and affect.  Intake/Output from previous day: 06/05 0701 - 06/06 0700 In: 1179 [P.O.:300; I.V.:300; Blood:579] Out: -  Intake/Output this shift: No intake/output data recorded.  Lab Results  Recent Labs    10/11/23 0446 10/12/23 0811 10/13/23 0813  WBC 8.8 8.0 10.2  HGB 7.0* 7.9* 9.8*  HCT 23.4* 26.5* 31.9*  PLT 412* 402* 434*   BMET Recent Labs    10/10/23 0933 10/11/23 0446  NA 136 138  K 3.5 3.8  CL 103 107  CO2 26 26  GLUCOSE 116* 105*  BUN 12 11  CREATININE 0.72 0.73  CALCIUM  8.9 8.3*   LFT Recent Labs    10/10/23 0933  PROT 7.4  ALBUMIN 3.6  AST 17  ALT 13  ALKPHOS 121  BILITOT 0.6   PT/INR Recent Labs    10/10/23 0933  LABPROT  13.0  INR 1.0   Hepatitis Panel No results for input(s): "HEPBSAG", "HCVAB", "HEPAIGM", "HEPBIGM" in the last 72 hours.   Studies/Results No results found.  Assessment  68 y.o. male with a history of cryptogenic stroke, GERD, HTN, CAD and MI s/p CABG who was seen early this week 6/2 for evaluation of anemia with initial plan for outpatient EGD/Colonoscopy however given recent significant drop in labs, patient and wife were uncomfortable with this therefore presented to the ED and admitted for inpatient workup therefore GI consulted.   Symptomatic iron deficiency anemia: - Hgb 12 (2023) >>11>> (03/2023)>>10.5 (5/5)>>10 (09/29/23)>>7.1 (10/09/23)>>9.8 today - Iron 15, iron sat 4% and ferritin 4 % - In office early this week reported 3 weeks of melena that resolved after stopping eliquis  - Received total 3u PRBC this admission - previously with abdominal pain that has also resolved and no further melena. - EGD 6/4: normal esophagus and stomach, erythematous duodenopathy - Colonoscopy 6/5: 7 mm polyp in cecum, severe diverticulosis, normal TI and non bleeding internal hemorrhoids, no evidence of prior bleeding.  - Plan for outpatient capsule study postdischarge for complete anemia workup. - Okay to resume Eliquis  and monitor for any recurrent bleeding.  Will check labs in 1 week postdischarge.  Discussed this in detail with patient and his wife that he does not have to return to [place] to do lab work that it can be done with [facility] in [place] close  to home.  Discussed with patient and his wife that we will work on getting capsule study scheduled, she requested labs and capsule study be done same day however I spoke with her in regards to schedule availability for capsule that I am unsure what this looks like and regardless of timing of capsule study that I would like for him to have labs done by the end of next week.  They are both in agreement with this plan.  Discussed capsule study in  detail with patient and wife today including potential risks including retention of capsule with requirement of possible surgery and counseled on prep in the process of performing the study.  Plan / Recommendations  PPI BID CBC 1 week post discharge -can be done in [place] (close to home) Oral iron daily on discharge Outpatient capsule study - our office will arrange May resume Eliquis  with close monitoring  Attending will follow up pending pathology Office follow up in 2-3 weeks If hgb remains stable without overt bleeding then okay for d/c from GI standpoint    LOS: 0 days    10/13/2023, 9:27 AM   Julian Obey, MSN, FNP-BC, AGACNP-BC [facility]

## 2023-10-13 NOTE — Discharge Summary (Signed)
 Physician Discharge Summary   Patient: Johnny Sullivan MRN: [id number] DOB: 09-Oct-1955  Admit date:     10/10/2023  Discharge date: 10/13/23  Discharge Physician: Bobbetta Burnet   PCP: Sina Dudley, MD   Recommendations at discharge:   Follow-up with PCP in 1 week Follow-up with cardiologist in 1 to 2 weeks Follow-up with cardiologist in 1-2 weeks Per GI okay to restart Eliquis , continue iron supplements  Discharge Diagnoses: Principal Problem:   Symptomatic anemia Active Problems:   Cecal polyp   Diverticulosis of colon without hemorrhage  Resolved Problems:   * No resolved hospital problems. *  Hospital Course: Johnny Sullivan is a  68 year old male with a history of hypertension, hyperlipidemia, coronary artery disease status post CABG, cryptogenic stroke 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  During this period of time, the patient has had fatigue and some dyspnea on exertion.  History is supplemented by the patient's spouse at the bedside.  Patient has some difficulty communicating secondary to his stroke.  The patient has had some intermittent upper abdominal pain during this period of time.  He has not had any fevers, chills, chest pain, nausea, vomiting, diarrhea, hematochezia, hematemesis.  He denies any NSAIDs or any medications.  The patient went to see GI on 10/09/23 in the office.  Dr. Sammi Crick obtained routine blood work.  CBC showed hemoglobin 7.1 on 10/09/23.  The patient was instructed to go to the emergency department for further evaluation and treatment.  He presented to the ED on 10/10/2023 where hemoglobin was 7.2.  GI was consulted.  After discussion with the patient's family, it was felt in the patient's best interest to be admitted to the hospital for further evaluation and treatment including possible upper and lower endoscopy.  The patient was  hemodynamically stable in the ED.  WBC 8.5, hemoglobin 7.2, platelets  197.  Sodium 136, potassium 3.5, bicarbonate 26, serum creatinine 0.72.  INR 1.0.  GI was consulted assist with management.  1 unit PRBC was transfused.      Symptomatic anemia/heme positive stool - Presented with hemoglobin 7.1, 7.2, 7.0 > 1U PRBC>> 9.8  - History.  Serial hemoglobin baseline ~10  - S/p EGD-Erythematous duodenopathy -otherwise within normal limits -colonoscopy in a.m. 10/12/2023 - One 7 mm polyp in the cecum, removed with a cold snare. Resected and retrieved.  - Severe Diverticulosis in the entire examined colon.   - The examined portion of the ileum was normal.   - Non- bleeding internal hemorrhoids.   - No evidence of old or active bleeding in the entire examined colon and terminal ileum  - FOBT positive  -Follow-up with GI within 1-2 weeks - S/P PRBC blood transfusion - maintain hemoglobin close to 8.0 due to cardiac history - IV pantoprazole  switched to p.o. twice a day     Latest Ref Rng & Units 10/13/2023    8:13 AM 10/12/2023    8:11 AM 10/11/2023    4:46 AM  CBC  WBC 4.0 - 10.5 K/uL 10.2  8.0  8.8   Hemoglobin 13.0 - 17.0 g/dL 9.8  7.9  7.0   Hematocrit 39.0 - 52.0 % 31.9  26.5  23.4   Platelets 150 - 400 K/uL 434  402  412          History of cryptogenic stroke-aphasic - Apixaban -was on hold- -per GI okay to restart Apixaban  10/13/2023 - Continue statin   Essential hypertension - holding amlodipine, hydrochlorothiazide  - restart lower  dose coreg   Mixed hyperlipidemia - Continue statin   Coronary Artery disease with history of CABG - No chest pain presently    Disposition: Home Diet recommendation:  Discharge Diet Orders (From admission, onward)     Start     Ordered   10/13/23 0000  Diet - low sodium heart healthy        10/13/23 1207           Cardiac diet DISCHARGE MEDICATION: Allergies as of 10/13/2023       Reactions   Vancomycin Other (See Comments)   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   apixaban  5 MG Tabs tablet Commonly known as: Eliquis  Take 1 tablet (5 mg total) by mouth 2 (two) times daily.   atorvastatin  80 MG tablet Commonly known as: LIPITOR  Take 80 mg by mouth daily.   carvedilol 6.25 MG tablet Commonly known as: COREG Take 1 tablet (6.25 mg total) by mouth 2 (two) times daily with a meal. What changed:  medication strength how much to take   docusate sodium  100 MG capsule Commonly known as: Colace Take 1 capsule (100 mg total) by mouth 2 (two) times daily.   ferrous sulfate 325 (65 FE) MG EC tablet Take 1 tablet (325 mg total) by mouth daily with breakfast. What changed: when to take this   multivitamin capsule Take 1 capsule by mouth daily. For men > 50   pantoprazole  40 MG tablet Commonly known as: Protonix  Take 1 tablet (40 mg total) by mouth 2 (two) times daily.   sertraline 50 MG tablet Commonly known as: ZOLOFT Take 50 mg by mouth daily.        Discharge Exam: Filed Weights   10/10/23 1610 10/10/23 1519  Weight: 93.5 kg 92.4 kg        General:  AAO x 3,  cooperative, no distress; aphasic  HEENT:  Normocephalic, PERRL, otherwise with in Normal limits   Neuro:  CNII-XII intact. , normal motor and sensation, reflexes intact   Lungs:   Clear to auscultation BL, Respirations unlabored,  No wheezes / crackles  Cardio:    S1/S2, RRR, No murmure, No Rubs or Gallops   Abdomen:  Soft, non-tender, bowel sounds active all four quadrants, no guarding or peritoneal  signs.  Muscular  skeletal:  Limited exam -global generalized weaknesses - in bed, able to move all 4 extremities,   2+ pulses,  symmetric, No pitting edema  Skin:  Dry, warm to touch, negative for any Rashes,  Wounds: Please see nursing documentation          Condition at discharge: good  The results of significant diagnostics from this hospitalization (including imaging, microbiology, ancillary and laboratory) are listed below for reference.   Imaging Studies: No results found.  Microbiology: Results for orders placed or performed during the hospital encounter of 04/20/16  Surgical pcr screen  Status: None   Collection Time: 04/20/16  9:50 AM   Specimen: Vein; Nasal Swab  Result Value Ref Range Status   MRSA, PCR NEGATIVE NEGATIVE Final   Staphylococcus aureus NEGATIVE NEGATIVE Final    Comment:        The Xpert SA Assay (FDA approved for NASAL specimens in patients over 88 years of age), is one component of a comprehensive surveillance program.  Test performance has been validated by [facility] for patients greater than or equal to 1 year old. It is not intended to diagnose infection nor to guide or monitor treatment.     Labs: CBC: Recent Labs  Lab 10/09/23 1527 10/10/23 0933 10/11/23 0446 10/12/23 0811 10/13/23 0813  WBC 11.2* 8.5 8.8 8.0 10.2  NEUTROABS 7,392 5.6  --  5.7  --   HGB 7.1* 7.2* 7.0* 7.9* 9.8*  HCT 23.9* 23.9* 23.4* 26.5* 31.9*  MCV 77.1* 79.1* 78.5* 78.9* 80.2  PLT 587* 497* 412* 402* 434*   Basic Metabolic Panel: Recent Labs  Lab 10/10/23 0933 10/11/23 0446  NA 136 138  K 3.5 3.8  CL 103 107  CO2 26 26  GLUCOSE 116* 105*  BUN 12 11  CREATININE 0.72 0.73  CALCIUM  8.9 8.3*   Liver Function Tests: Recent Labs  Lab 10/10/23 0933  AST 17  ALT 13  ALKPHOS 121  BILITOT 0.6  PROT 7.4  ALBUMIN 3.6   CBG: Recent Labs  Lab 10/12/23 1419  GLUCAP 84    Discharge time spent: greater than 30  minutes.  Signed: Bobbetta Burnet, MD [facility] 10/13/2023

## 2023-10-16 ENCOUNTER — Other Ambulatory Visit (INDEPENDENT_AMBULATORY_CARE_PROVIDER_SITE_OTHER): Payer: Self-pay

## 2023-10-16 DIAGNOSIS — K921 Melena: Secondary | ICD-10-CM

## 2023-10-16 DIAGNOSIS — D649 Anemia, unspecified: Secondary | ICD-10-CM

## 2023-10-16 NOTE — Telephone Encounter (Signed)
 To be done at [facility] on 10/19/2023 or 10/20/2023. Pt wife made aware.(On Dpr).

## 2023-10-16 NOTE — Telephone Encounter (Signed)
 Thanks

## 2023-10-16 NOTE — Telephone Encounter (Signed)
 Orders placed in Epic for any [employer].

## 2023-10-16 NOTE — Telephone Encounter (Signed)
 Spoke with spouse. Capsule study scheduled for 6/19. Aware will send instructions via mychart. Send ing to susan for appt.

## 2023-10-17 ENCOUNTER — Ambulatory Visit (HOSPITAL_COMMUNITY): Admission: RE | Admit: 2023-10-17 | Source: Home / Self Care | Admitting: Gastroenterology

## 2023-10-17 ENCOUNTER — Encounter (HOSPITAL_COMMUNITY): Admission: RE | Payer: Self-pay | Source: Home / Self Care

## 2023-10-17 SURGERY — COLONOSCOPY
Anesthesia: Choice

## 2023-10-21 ENCOUNTER — Ambulatory Visit: Payer: Self-pay | Admitting: Gastroenterology

## 2023-10-21 LAB — CBC WITH DIFFERENTIAL/PLATELET
Basophils Absolute: 0.1 10*3/uL (ref 0.0–0.2)
Basos: 1 %
EOS (ABSOLUTE): 0.2 10*3/uL (ref 0.0–0.4)
Eos: 2 %
Hematocrit: 33.4 % — ABNORMAL LOW (ref 37.5–51.0)
Hemoglobin: 9.8 g/dL — ABNORMAL LOW (ref 13.0–17.7)
Immature Grans (Abs): 0 10*3/uL (ref 0.0–0.1)
Immature Granulocytes: 0 %
Lymphocytes Absolute: 2.8 10*3/uL (ref 0.7–3.1)
Lymphs: 26 %
MCH: 24.3 pg — ABNORMAL LOW (ref 26.6–33.0)
MCHC: 29.3 g/dL — ABNORMAL LOW (ref 31.5–35.7)
MCV: 83 fL (ref 79–97)
Monocytes Absolute: 0.6 10*3/uL (ref 0.1–0.9)
Monocytes: 6 %
Neutrophils Absolute: 6.8 10*3/uL (ref 1.4–7.0)
Neutrophils: 65 %
Platelets: 501 10*3/uL — ABNORMAL HIGH (ref 150–450)
RBC: 4.03 x10E6/uL — ABNORMAL LOW (ref 4.14–5.80)
RDW: 16 % — ABNORMAL HIGH (ref 11.6–15.4)
WBC: 10.5 10*3/uL (ref 3.4–10.8)

## 2023-10-23 ENCOUNTER — Ambulatory Visit (INDEPENDENT_AMBULATORY_CARE_PROVIDER_SITE_OTHER): Payer: Self-pay | Admitting: Gastroenterology

## 2023-10-26 ENCOUNTER — Encounter (HOSPITAL_COMMUNITY)
Admission: RE | Disposition: A | Discharge status: Home / Self Care | Payer: Self-pay | Source: Home / Self Care | Attending: Gastroenterology

## 2023-10-26 ENCOUNTER — Ambulatory Visit (HOSPITAL_COMMUNITY)
Admission: RE | Admit: 2023-10-26 | Discharge: 2023-10-26 | Disposition: A | Discharge status: Home / Self Care | Source: Home / Self Care | Attending: Gastroenterology | Admitting: Gastroenterology

## 2023-10-26 DIAGNOSIS — K921 Melena: Secondary | ICD-10-CM | POA: Diagnosis not present

## 2023-10-26 DIAGNOSIS — D509 Iron deficiency anemia, unspecified: Secondary | ICD-10-CM | POA: Diagnosis not present

## 2023-10-26 DIAGNOSIS — K922 Gastrointestinal hemorrhage, unspecified: Secondary | ICD-10-CM

## 2023-10-26 DIAGNOSIS — R933 Abnormal findings on diagnostic imaging of other parts of digestive tract: Secondary | ICD-10-CM | POA: Insufficient documentation

## 2023-10-26 HISTORY — PX: GIVENS CAPSULE STUDY: SHX5432

## 2023-10-26 SURGERY — IMAGING PROCEDURE, GI TRACT, INTRALUMINAL, VIA CAPSULE

## 2023-10-27 ENCOUNTER — Observation Stay (HOSPITAL_COMMUNITY): Admitting: Anesthesiology

## 2023-10-27 ENCOUNTER — Inpatient Hospital Stay (HOSPITAL_COMMUNITY)
Admission: EM | Admit: 2023-10-27 | Discharge: 2023-10-30 | DRG: 378 | Disposition: A | Discharge status: Other Acute Inpatient Hospital | Attending: Family Medicine | Admitting: Family Medicine

## 2023-10-27 ENCOUNTER — Encounter (HOSPITAL_COMMUNITY): Payer: Self-pay | Admitting: Gastroenterology

## 2023-10-27 ENCOUNTER — Telehealth (INDEPENDENT_AMBULATORY_CARE_PROVIDER_SITE_OTHER): Payer: Self-pay

## 2023-10-27 ENCOUNTER — Other Ambulatory Visit: Payer: Self-pay

## 2023-10-27 ENCOUNTER — Encounter (HOSPITAL_COMMUNITY)
Admission: EM | Disposition: A | Discharge status: Other Acute Inpatient Hospital | Payer: Self-pay | Source: Home / Self Care | Attending: Internal Medicine

## 2023-10-27 DIAGNOSIS — Z888 Allergy status to other drugs, medicaments and biological substances status: Secondary | ICD-10-CM

## 2023-10-27 DIAGNOSIS — Z860100 Personal history of colon polyps, unspecified: Secondary | ICD-10-CM

## 2023-10-27 DIAGNOSIS — D509 Iron deficiency anemia, unspecified: Secondary | ICD-10-CM

## 2023-10-27 DIAGNOSIS — E785 Hyperlipidemia, unspecified: Secondary | ICD-10-CM | POA: Diagnosis present

## 2023-10-27 DIAGNOSIS — Z820 Family history of epilepsy and other diseases of the nervous system: Secondary | ICD-10-CM

## 2023-10-27 DIAGNOSIS — D649 Anemia, unspecified: Secondary | ICD-10-CM | POA: Diagnosis not present

## 2023-10-27 DIAGNOSIS — D62 Acute posthemorrhagic anemia: Secondary | ICD-10-CM | POA: Diagnosis present

## 2023-10-27 DIAGNOSIS — K922 Gastrointestinal hemorrhage, unspecified: Principal | ICD-10-CM | POA: Diagnosis present

## 2023-10-27 DIAGNOSIS — Z79899 Other long term (current) drug therapy: Secondary | ICD-10-CM

## 2023-10-27 DIAGNOSIS — K648 Other hemorrhoids: Secondary | ICD-10-CM | POA: Diagnosis present

## 2023-10-27 DIAGNOSIS — K921 Melena: Principal | ICD-10-CM | POA: Diagnosis present

## 2023-10-27 DIAGNOSIS — Z7901 Long term (current) use of anticoagulants: Secondary | ICD-10-CM

## 2023-10-27 DIAGNOSIS — I252 Old myocardial infarction: Secondary | ICD-10-CM

## 2023-10-27 DIAGNOSIS — K219 Gastro-esophageal reflux disease without esophagitis: Secondary | ICD-10-CM | POA: Diagnosis present

## 2023-10-27 DIAGNOSIS — Z881 Allergy status to other antibiotic agents status: Secondary | ICD-10-CM

## 2023-10-27 DIAGNOSIS — Z87891 Personal history of nicotine dependence: Secondary | ICD-10-CM

## 2023-10-27 DIAGNOSIS — K3182 Dieulafoy lesion (hemorrhagic) of stomach and duodenum: Secondary | ICD-10-CM | POA: Diagnosis present

## 2023-10-27 DIAGNOSIS — I2510 Atherosclerotic heart disease of native coronary artery without angina pectoris: Secondary | ICD-10-CM | POA: Diagnosis present

## 2023-10-27 DIAGNOSIS — I10 Essential (primary) hypertension: Secondary | ICD-10-CM | POA: Diagnosis present

## 2023-10-27 DIAGNOSIS — F32A Depression, unspecified: Secondary | ICD-10-CM | POA: Diagnosis present

## 2023-10-27 DIAGNOSIS — Z951 Presence of aortocoronary bypass graft: Secondary | ICD-10-CM

## 2023-10-27 DIAGNOSIS — K5730 Diverticulosis of large intestine without perforation or abscess without bleeding: Secondary | ICD-10-CM | POA: Diagnosis present

## 2023-10-27 DIAGNOSIS — Z96641 Presence of right artificial hip joint: Secondary | ICD-10-CM | POA: Diagnosis present

## 2023-10-27 DIAGNOSIS — I69320 Aphasia following cerebral infarction: Secondary | ICD-10-CM

## 2023-10-27 DIAGNOSIS — I6300 Cerebral infarction due to thrombosis of unspecified precerebral artery: Secondary | ICD-10-CM | POA: Diagnosis present

## 2023-10-27 DIAGNOSIS — E782 Mixed hyperlipidemia: Secondary | ICD-10-CM | POA: Diagnosis present

## 2023-10-27 HISTORY — PX: ENTEROSCOPY: SHX5533

## 2023-10-27 LAB — CBC
HCT: 30.2 % — ABNORMAL LOW (ref 39.0–52.0)
Hemoglobin: 8.9 g/dL — ABNORMAL LOW (ref 13.0–17.0)
MCH: 23.2 pg — ABNORMAL LOW (ref 26.0–34.0)
MCHC: 29.5 g/dL — ABNORMAL LOW (ref 30.0–36.0)
MCV: 78.9 fL — ABNORMAL LOW (ref 80.0–100.0)
Platelets: 507 10*3/uL — ABNORMAL HIGH (ref 150–400)
RBC: 3.83 MIL/uL — ABNORMAL LOW (ref 4.22–5.81)
RDW: 16.1 % — ABNORMAL HIGH (ref 11.5–15.5)
WBC: 8.6 10*3/uL (ref 4.0–10.5)
nRBC: 0 % (ref 0.0–0.2)

## 2023-10-27 LAB — COMPREHENSIVE METABOLIC PANEL WITH GFR
ALT: 15 U/L (ref 0–44)
AST: 20 U/L (ref 15–41)
Albumin: 3.5 g/dL (ref 3.5–5.0)
Alkaline Phosphatase: 111 U/L (ref 38–126)
Anion gap: 11 (ref 5–15)
BUN: 9 mg/dL (ref 8–23)
CO2: 24 mmol/L (ref 22–32)
Calcium: 8.8 mg/dL — ABNORMAL LOW (ref 8.9–10.3)
Chloride: 103 mmol/L (ref 98–111)
Creatinine, Ser: 0.78 mg/dL (ref 0.61–1.24)
GFR, Estimated: >60 mL/min (ref >60)
Glucose, Bld: 95 mg/dL (ref 70–99)
Potassium: 3.7 mmol/L (ref 3.5–5.1)
Sodium: 138 mmol/L (ref 135–145)
Total Bilirubin: 0.4 mg/dL (ref 0.0–1.2)
Total Protein: 7 g/dL (ref 6.5–8.1)

## 2023-10-27 LAB — TYPE AND SCREEN
ABO/RH(D): A POS
Antibody Screen: NEGATIVE

## 2023-10-27 SURGERY — ENTEROSCOPY
Anesthesia: General

## 2023-10-27 MED ORDER — SPOT INK MARKER SYRINGE KIT
PACK | SUBMUCOSAL | Status: DC | PRN
  Administered 2023-10-27: 4 mL via SUBMUCOSAL

## 2023-10-27 MED ORDER — ATORVASTATIN CALCIUM 40 MG PO TABS
80.0000 mg | ORAL_TABLET | Freq: Every day | ORAL | Status: DC
  Administered 2023-10-28 – 2023-10-29 (×2): 80 mg via ORAL
  Filled 2023-10-27 (×2): qty 2

## 2023-10-27 MED ORDER — PROPOFOL 500 MG/50ML IV EMUL
INTRAVENOUS | Status: AC
Start: 2023-10-27 — End: 2023-10-27
  Filled 2023-10-27: qty 50

## 2023-10-27 MED ORDER — PROPOFOL 10 MG/ML IV BOLUS
INTRAVENOUS | Status: DC | PRN
  Administered 2023-10-27: 400 mg via INTRAVENOUS

## 2023-10-27 MED ORDER — ONDANSETRON HCL 4 MG/2ML IJ SOLN
4.0000 mg | Freq: Four times a day (QID) | INTRAMUSCULAR | Status: AC | PRN
Start: 2023-10-27 — End: ?

## 2023-10-27 MED ORDER — SERTRALINE HCL 50 MG PO TABS
50.0000 mg | ORAL_TABLET | Freq: Every day | ORAL | Status: DC
  Administered 2023-10-28 – 2023-10-29 (×2): 50 mg via ORAL
  Filled 2023-10-27 (×2): qty 1

## 2023-10-27 MED ORDER — ACETAMINOPHEN 650 MG RE SUPP: 650.0000 mg | Freq: Four times a day (QID) | RECTAL | Status: DC | PRN

## 2023-10-27 MED ORDER — LACTATED RINGERS IV SOLN: INTRAVENOUS | Status: DC

## 2023-10-27 MED ORDER — CARVEDILOL 3.125 MG PO TABS
6.2500 mg | ORAL_TABLET | Freq: Two times a day (BID) | ORAL | Status: DC
  Administered 2023-10-27 – 2023-10-29 (×5): 6.25 mg via ORAL
  Filled 2023-10-27 (×5): qty 2

## 2023-10-27 MED ORDER — ALBUTEROL SULFATE (2.5 MG/3ML) 0.083% IN NEBU
2.5000 mg | INHALATION_SOLUTION | RESPIRATORY_TRACT | Status: AC | PRN
Start: 2023-10-27 — End: ?

## 2023-10-27 MED ORDER — POLYETHYLENE GLYCOL 3350 17 G PO PACK: 17.0000 g | PACK | Freq: Every day | ORAL | Status: DC | PRN

## 2023-10-27 MED ORDER — PANTOPRAZOLE SODIUM 40 MG PO TBEC
40.0000 mg | DELAYED_RELEASE_TABLET | Freq: Two times a day (BID) | ORAL | Status: DC
  Administered 2023-10-27 – 2023-10-29 (×5): 40 mg via ORAL
  Filled 2023-10-27 (×5): qty 1

## 2023-10-27 MED ORDER — MULTIVITAMINS PO CAPS: 1.0000 | ORAL_CAPSULE | Freq: Every day | ORAL | Status: DC

## 2023-10-27 MED ORDER — ACETAMINOPHEN 325 MG PO TABS
650.0000 mg | ORAL_TABLET | Freq: Four times a day (QID) | ORAL | Status: DC | PRN
  Filled 2023-10-27: qty 2

## 2023-10-27 MED ORDER — SPOT INK MARKER SYRINGE KIT
PACK | SUBMUCOSAL | Status: AC
Start: 2023-10-27 — End: 2023-10-27
  Filled 2023-10-27: qty 5

## 2023-10-27 MED ORDER — ONDANSETRON HCL 4 MG PO TABS: 4.0000 mg | ORAL_TABLET | Freq: Four times a day (QID) | ORAL | Status: DC | PRN

## 2023-10-27 MED ORDER — ADULT MULTIVITAMIN W/MINERALS CH
1.0000 | ORAL_TABLET | Freq: Every day | ORAL | Status: DC
  Administered 2023-10-28 – 2023-10-29 (×2): 1 via ORAL
  Filled 2023-10-27 (×2): qty 1

## 2023-10-27 MED ORDER — SODIUM CHLORIDE 0.9 % IV SOLN: INTRAVENOUS | Status: DC

## 2023-10-27 MED ORDER — HYDRALAZINE HCL 20 MG/ML IJ SOLN: 5.0000 mg | INTRAMUSCULAR | Status: DC | PRN

## 2023-10-27 MED ORDER — LACTATED RINGERS IV SOLN: INTRAVENOUS | Status: AC

## 2023-10-27 NOTE — Op Note (Addendum)
 [facility] Patient Name: Johnny Sullivan Procedure Date: 10/27/2023 4:32 PM MRN: [id number] Date of Birth: Mar 15, 1956 Attending MD: Terril Fetters , MD, [id number] CSN: [id number] Age: 68 Admit Type: Emergency Department Procedure:                Small bowel enteroscopy Indications:              Abnormal video capsule endoscopy ( Bleeding seen at                            57% SBTT) , Melena, Obscure gastrointestinal                            bleeding Providers:                Terril Fetters, MD, Crystal Page, Theola Fitch Referring MD:              Medicines:                Monitored Anesthesia Care Complications:            No immediate complications. Estimated Blood Loss:     Estimated blood loss: none. Procedure:                After obtaining informed consent, the endoscope was                            passed under direct vision. Throughout the                            procedure, the patient's blood pressure, pulse, and                            oxygen saturations were monitored continuously. The                            [id number] scope was introduced through                            the mouth and advanced to the jejunum, to the 180                            cm mark (from the incisors). The small bowel                            enteroscopy was accomplished without difficulty.                            The patient tolerated the procedure well. Scope In: 4:57:07 PM Scope Out: 5:16:30 PM Total Procedure Duration: 0 hours 19 minutes 23 seconds  Findings:      The esophagus was normal.      The stomach was normal.      There was no evidence of significant pathology in the entire examined       duodenum.      There was no evidence of significant pathology at 180 cm (from the       incisors). Area was tattooed with an injection  of 4 mL of [place] ink. Impression:               - Normal esophagus.                           - Normal stomach.                            - Normal examined duodenum.                           - The examined portion of the jejunum was normal.                            Tattooed the distal extend of the small bowel                            reached .                           - No specimens collected.                           No evidence of active or old bleeding seen                            throughout the examination Moderate Sedation:      Per Anesthesia Care Recommendation:           Keep NPO                           CBC q12 hours                           Given lesion is at 57% SBTT , had evidence of                            bleeding on SBE yesterday and clinical indication                            for anticoagulation (strokes) ; will reach out to                            [facility] for double balloon enteroscopy Procedure Code(s):        --- Professional ---                           [phone], Small intestinal endoscopy, enteroscopy                            beyond second portion of duodenum, not including                            ileum; diagnostic, including collection of  specimen(s) by brushing or washing, when performed                            (separate procedure)                           44799, Unlisted procedure, small intestine Diagnosis Code(s):        --- Professional ---                           K92.1, Melena (includes Hematochezia)                           K92.2, Gastrointestinal hemorrhage, unspecified                           R93.3, Abnormal findings on diagnostic imaging of                            other parts of digestive tract CPT copyright 2022 American Medical Association. All rights reserved. The codes documented in this report are preliminary and upon coder review may  be revised to meet current compliance requirements. Terril Fetters, MD Terril Fetters, MD 10/27/2023 5:35:33 PM This report has been signed electronically. Number of Addenda: 0

## 2023-10-27 NOTE — Anesthesia Postprocedure Evaluation (Signed)
 Anesthesia Post Note  Patient: [name]  Procedure(s) Performed: ENTEROSCOPY  Patient location during evaluation: PACU Anesthesia Type: General Level of consciousness: awake and alert Pain management: pain level controlled Vital Signs Assessment: post-procedure vital signs reviewed and stable Respiratory status: spontaneous breathing, nonlabored ventilation, respiratory function stable and patient connected to nasal cannula oxygen Cardiovascular status: blood pressure returned to baseline and stable Postop Assessment: no apparent nausea or vomiting Anesthetic complications: no   No notable events documented.   Last Vitals:  Vitals:   10/27/23 1629 10/27/23 1720  BP: (!) 165/58 (!) 120/55  Pulse: (!) 56 70  Resp: 18 20  Temp: 37.1 C 36.5 C  SpO2: 99% 95%    Last Pain:  Vitals:   10/27/23 1720  TempSrc:   PainSc: 0-No pain                 Coretha Dew

## 2023-10-27 NOTE — ED Provider Notes (Signed)
 [place] [facility] AT [facility] Provider Note  CSN: [id number] Arrival date & time: 10/27/23 1316  Chief Complaint(s) GI Bleeding  HPI Johnny Sullivan is a 68 y.o. male with PMH HTN, HLD, CAD status post CABG, CVA, depression, previous GI bleed requiring packed red blood cell administration who presents emerged from for evaluation of a positive PillCam study and persistent GI bleeding.  Patient a PillCam performed by his primary neurologist Dr. Alita Irwin showing a small bowel bleed and he was transferred to the emergency department for admission for enteroscopy.  Currently patient arrives with minimal complaints including no abdominal pain, nausea, vomiting, chest pain, shortness of breath or other systemic symptoms   Past Medical History Past Medical History:  Diagnosis Date   Arthritis    Osteoarthritis- right hip   Coronary artery disease    GERD (gastroesophageal reflux disease)    Hypertension    Myocardial infarction (HCC)     mild-Dr. Zakhary,cardiolgy-[place] follows -gives clearance.   Stroke [facility])    Urethral stricture    'uses self [place] caths weekly to correct.   Patient Active Problem List   Diagnosis Date Noted   Cecal polyp 10/12/2023   Diverticulosis of colon without hemorrhage 10/12/2023   Symptomatic anemia 10/10/2023   Melena 10/09/2023   Special screening for malignant neoplasms, colon 10/09/2023   Hx of CABG 03/06/2017   Hyperlipidemia 11/30/2016   Essential hypertension 11/30/2016   Aphasia 11/30/2016   Acute embolic stroke [facility])    Cerebrovascular accident (CVA) due to thrombosis of precerebral artery (HCC)    Cerebrovascular accident (CVA) (HCC) 09/26/2016   S/P right THA, AA 04/26/2016   Home Medication(s) Prior to Admission medications   Medication Sig Start Date End Date Taking? Authorizing Provider  acetaminophen  (TYLENOL ) 500 MG tablet Take 500 mg by mouth every 6 (six) hours as needed for fever or pain.     [provider]  apixaban  (ELIQUIS ) 5 MG TABS tablet Take 1 tablet (5 mg total) by mouth 2 (two) times daily. 04/03/17   Consuelo Denmark, MD  atorvastatin  (LIPITOR ) 80 MG tablet Take 80 mg by mouth daily. 03/23/16   [provider]  carvedilol  (COREG ) 6.25 MG tablet Take 1 tablet (6.25 mg total) by mouth 2 (two) times daily with a meal. 10/13/23 01/11/24  Shahmehdi, Constantino Demark, MD  docusate sodium  (COLACE) 100 MG capsule Take 1 capsule (100 mg total) by mouth 2 (two) times daily. 10/13/23 10/12/24  Bobbetta Burnet, MD  ferrous sulfate  325 (65 FE) MG EC tablet Take 1 tablet (325 mg total) by mouth daily with breakfast. 10/13/23   Bobbetta Burnet, MD  Multiple Vitamin (MULTIVITAMIN) capsule Take 1 capsule by mouth daily. For men > 50    [provider]  pantoprazole  (PROTONIX ) 40 MG tablet Take 1 tablet (40 mg total) by mouth 2 (two) times daily. 10/13/23 11/12/23  Bobbetta Burnet, MD  sertraline  (ZOLOFT ) 50 MG tablet Take 50 mg by mouth daily. 06/03/17   [provider]  Past Surgical History Past Surgical History:  Procedure Laterality Date   BACK SURGERY     lumbar discectomy   CARDIAC CATHETERIZATION     COLONOSCOPY N/A 10/12/2023   Procedure: COLONOSCOPY;  Surgeon: Hargis Lias, MD;  Location: [facility] ENDO SUITE;  Service: Endoscopy;  Laterality: N/A;   CORONARY ARTERY BYPASS GRAFT     09-20-2012 x3 -[place]   ESOPHAGOGASTRODUODENOSCOPY N/A 10/11/2023   Procedure: EGD (ESOPHAGOGASTRODUODENOSCOPY);  Surgeon: Umberto Ganong, Bearl Limes, MD;  Location: [facility] ENDO SUITE;  Service: Gastroenterology;  Laterality: N/A;   GIVENS CAPSULE STUDY N/A 10/26/2023   Procedure: IMAGING PROCEDURE, GI TRACT, INTRALUMINAL, VIA CAPSULE;  Surgeon: Umberto Ganong, Bearl Limes, MD;  Location: [facility] ENDO SUITE;  Service: Gastroenterology;  Laterality: N/A;  730am   IR  PERCUTANEOUS ART THROMBECTOMY/INFUSION INTRACRANIAL INC DIAG ANGIO  09/26/2016   LOOP RECORDER INSERTION N/A 09/30/2016   Procedure: Loop Recorder Insertion;  Surgeon: Jolly Needle, MD;  Location: [facility] INVASIVE CV LAB;  Service: Cardiovascular;  Laterality: N/A;   RADIOLOGY WITH ANESTHESIA N/A 09/26/2016   Procedure: RADIOLOGY WITH ANESTHESIA;  Surgeon: Luellen Sages, MD;  Location: [facility] OR;  Service: Radiology;  Laterality: N/A;   TEE WITHOUT CARDIOVERSION N/A 09/30/2016   Procedure: TRANSESOPHAGEAL ECHOCARDIOGRAM (TEE);  Surgeon: Darlis Eisenmenger, MD;  Location: [facility] ENDOSCOPY;  Service: Cardiovascular;  Laterality: N/A;   TOTAL HIP ARTHROPLASTY Right 04/26/2016   Procedure: RIGHT TOTAL HIP ARTHROPLASTY ANTERIOR APPROACH;  Surgeon: Claiborne Crew, MD;  Location: [facility] ORS;  Service: Orthopedics;  Laterality: Right;   VASECTOMY     WRIST SURGERY     implant of plastic bone-mild ROM limits   Family History Family History  Problem Relation Age of Onset   Alzheimer's disease Mother     Social History Social History   Tobacco Use   Smoking status: Former    Current packs/day: 0.00    Average packs/day: 1.5 packs/day for 15.0 years (22.5 ttl pk-yrs)    Types: Cigarettes    Start date: 04/20/1997    Quit date: 04/20/2012    Years since quitting: 11.5   Smokeless tobacco: Former    Types: Chew    Quit date: 04/20/2012  Vaping Use   Vaping status: Never Used  Substance Use Topics   Alcohol use: Yes    Comment: occ. use   Drug use: No   Allergies Vancomycin and Alteplase   Review of Systems Review of Systems  Gastrointestinal:  Positive for blood in stool.    Physical Exam Vital Signs  I have reviewed the triage vital signs BP (!) 169/59   Pulse 61   Temp 98 F (36.7 C) (Oral)   Resp 17   Ht 5' 10 (1.778 m)   Wt 90.7 kg   SpO2 99%   BMI 28.70 kg/m   Physical Exam Constitutional:      General: He is not in acute distress.    Appearance: Normal appearance.  HENT:      Head: Normocephalic and atraumatic.     Nose: No congestion or rhinorrhea.   Eyes:     General:        Right eye: No discharge.        Left eye: No discharge.     Extraocular Movements: Extraocular movements intact.     Pupils: Pupils are equal, round, and reactive to light.    Cardiovascular:     Rate and Rhythm: Normal rate and regular rhythm.     Heart sounds: No murmur heard. Pulmonary:  Effort: No respiratory distress.     Breath sounds: No wheezing or rales.  Abdominal:     General: There is no distension.     Tenderness: There is no abdominal tenderness.   Musculoskeletal:        General: Normal range of motion.     Cervical back: Normal range of motion.   Skin:    General: Skin is warm and dry.   Neurological:     General: No focal deficit present.     Mental Status: He is alert.     ED Results and Treatments Labs (all labs ordered are listed, but only abnormal results are displayed) Labs Reviewed  COMPREHENSIVE METABOLIC PANEL WITH GFR - Abnormal; Notable for the following components:      Result Value   Calcium  8.8 (*)    All other components within normal limits  CBC - Abnormal; Notable for the following components:   RBC 3.83 (*)    Hemoglobin 8.9 (*)    HCT 30.2 (*)    MCV 78.9 (*)    MCH 23.2 (*)    MCHC 29.5 (*)    RDW 16.1 (*)    Platelets 507 (*)    All other components within normal limits  POC OCCULT BLOOD, ED  TYPE AND SCREEN                                                                                                                          Radiology No results found.  Pertinent labs & imaging results that were available during my care of the patient were reviewed by me and considered in my medical decision making (see MDM for details).  Medications Ordered in ED Medications - No data to display                                                                                                                                    Procedures Procedures  (including critical care time)  Medical Decision Making / ED Course   This patient presents to the ED for concern of GI bleed, this involves an extensive number of treatment options, and is a complaint that carries with it a high risk of complications and morbidity.  The differential diagnosis includes upper GI bleed including PUD, varices, boerhaave's, Mallory Weiss tear, aortoenteric fistula versus lower GI bleed including mass, diverticulosis/diverticulitis, hemorrhoids, anal fissure, mesenteric ischemia, aortoenteric  fistula, colitis  MDM: Patient seen emergency room for evaluation of a GI bleed.  Physical exam unremarkable.  Laboratory evaluation with a hemoglobin of 8.9 and an MCV of 78.9 but is otherwise unremarkable.  Patient admitted at [facility] request for his enteroscopy today.   Additional history obtained: -Additional history obtained from multiple family members -External records from outside source obtained and reviewed including: Chart review including previous notes, labs, imaging, consultation notes   Lab Tests: -I ordered, reviewed, and interpreted labs.   The pertinent results include:   Labs Reviewed  COMPREHENSIVE METABOLIC PANEL WITH GFR - Abnormal; Notable for the following components:      Result Value   Calcium  8.8 (*)    All other components within normal limits  CBC - Abnormal; Notable for the following components:   RBC 3.83 (*)    Hemoglobin 8.9 (*)    HCT 30.2 (*)    MCV 78.9 (*)    MCH 23.2 (*)    MCHC 29.5 (*)    RDW 16.1 (*)    Platelets 507 (*)    All other components within normal limits  POC OCCULT BLOOD, ED  TYPE AND SCREEN       Medicines ordered and prescription drug management: No orders of the defined types were placed in this encounter.   -I have reviewed the patients home medicines and have made adjustments as needed  Critical interventions none    Cardiac Monitoring: The patient was maintained on a  cardiac monitor.  I personally viewed and interpreted the cardiac monitored which showed an underlying rhythm of: NSR  Social Determinants of Health:  Factors impacting patients care include: 2none   Reevaluation: After the interventions noted above, I reevaluated the patient and found that they have :stayed the same  Co morbidities that complicate the patient evaluation  Past Medical History:  Diagnosis Date   Arthritis    Osteoarthritis- right hip   Coronary artery disease    GERD (gastroesophageal reflux disease)    Hypertension    Myocardial infarction (HCC)     mild-Dr. Zakhary,cardiolgy-[place] follows -gives clearance.   Stroke [facility])    Urethral stricture    'uses self [place] caths weekly to correct.      Dispostion: I considered admission for this patient, and patient will require hospital admission for GI bleed requiring enteroscopy     Final Clinical Impression(s) / ED Diagnoses Final diagnoses:  None     @PCDICTATION @    Karlyn Overman, MD 10/27/23 2149

## 2023-10-27 NOTE — Transfer of Care (Signed)
 Immediate Anesthesia Transfer of Care Note  Patient: Johnny Sullivan  Procedure(s) Performed: ENTEROSCOPY  Patient Location: PACU  Anesthesia Type:General  Level of Consciousness: awake and alert   Airway & Oxygen Therapy: Patient Spontanous Breathing  Post-op Assessment: Report given to RN and Post -op Vital signs reviewed and stable  Post vital signs: Reviewed and stable  Last Vitals:  Vitals Value Taken Time  BP 120/55 10/27/23 17:23  Temp 36.5 C 10/27/23 17:20  Pulse 65 10/27/23 17:27  Resp 21 10/27/23 17:27  SpO2 96 % 10/27/23 17:27  Vitals shown include unfiled device data.  Last Pain:  Vitals:   10/27/23 1720  TempSrc:   PainSc: 0-No pain         Complications: No notable events documented.

## 2023-10-27 NOTE — Anesthesia Preprocedure Evaluation (Signed)
 Anesthesia Evaluation  Patient identified by MRN, date of birth, ID band Patient awake    Reviewed: Allergy & Precautions, H&P , NPO status , Patient's Chart, lab work & pertinent test results, reviewed documented beta blocker date and time   Airway Mallampati: II  TM Distance: >3 FB Neck ROM: full    Dental no notable dental hx.    Pulmonary neg pulmonary ROS, former smoker   Pulmonary exam normal breath sounds clear to auscultation       Cardiovascular Exercise Tolerance: Good hypertension, + CAD and + Past MI   Rhythm:regular Rate:Normal     Neuro/Psych CVA  negative psych ROS   GI/Hepatic Neg liver ROS,GERD  ,,  Endo/Other  negative endocrine ROS    Renal/GU negative Renal ROS  negative genitourinary   Musculoskeletal   Abdominal   Peds  Hematology  (+) Blood dyscrasia, anemia   Anesthesia Other Findings   Reproductive/Obstetrics negative OB ROS                             Anesthesia Physical Anesthesia Plan  ASA: 4 and emergent  Anesthesia Plan: General   Post-op Pain Management:    Induction:   PONV Risk Score and Plan: Propofol  infusion  Airway Management Planned:   Additional Equipment:   Intra-op Plan:   Post-operative Plan:   Informed Consent: I have reviewed the patients History and Physical, chart, labs and discussed the procedure including the risks, benefits and alternatives for the proposed anesthesia with the patient or authorized representative who has indicated his/her understanding and acceptance.     Dental Advisory Given  Plan Discussed with: CRNA  Anesthesia Plan Comments:        Anesthesia Quick Evaluation

## 2023-10-27 NOTE — Progress Notes (Signed)
 I was informed by GI, Dr. Alita Irwin, whose assistance is greatly appreciated and arranging for transfer care to [facility] patient accepted to [facility] for balloon push enteroscopy, transfer anticipated on Sunday afternoon/evening for procedure on Monday, will keep on full liquid diet today, and will transition to clear liquid diet tomorrow evening, accepting physician Dr. Chana Comas (hospitalist). Seena Dadds MD

## 2023-10-27 NOTE — ED Triage Notes (Addendum)
 Pt was referred to the ED  by his GI doctor for admission and an endoscopy on Monday. Pt had a capsule endo yesterday which showed an active bleed. Pt does report dark tarry stool.   Pt with expressive aphasia at baseline and he presents with his daughter to help provide HPI.

## 2023-10-27 NOTE — H&P (Addendum)
 [facility] H&P   Patient Demographics:    Johnny Sullivan, is a 68 y.o. male  MRN: [id number]   DOB - October 28, 1955  Admit Date - 10/27/2023  Outpatient Primary MD for the patient is Sina Dudley, MD  Referring MD/NP/PA: Dr. Jeannie Milo  Outpatient Specialists: Gastroenterology Dr. Sammi Crick  Patient coming from: Home  Chief Complaint  Patient presents with   GI Bleeding      HPI:    Johnny Sullivan  is a 68 y.o. 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 -Him instructed to come to ED by his GI physician as he had a capsule endoscopy done yesterday, which was significant for active GI bleed in small bowel, most likely midsection, patient/wife at bedside assists with the history reports he is still having active melena, last time he took Eliquis  was yesterday at 2 PM, he did not eat anything today, last thing he had was black coffee at 9 AM. -In ED his hemoglobin at 8.9, was 9.8 on 10/19/2023, otherwise no significant labs abnormalities, he was typed and screened, and [facility] hospitalist consulted to admit, plan for enteroscopy this afternoon as discussed with GI  Review of systems:      A full 10 point Review of Systems was done, except as stated above, all other Review of Systems were negative.   With Past History of the following :    Past Medical History:  Diagnosis Date   Arthritis    Osteoarthritis- right hip    Coronary artery disease    GERD (gastroesophageal reflux disease)    Hypertension    Myocardial infarction (HCC)     mild-Dr. Zakhary,cardiolgy-[place] follows -gives clearance.   Stroke [facility])    Urethral stricture    'uses self [place] caths weekly to correct.      Past Surgical History:  Procedure Laterality Date   BACK SURGERY     lumbar discectomy   CARDIAC CATHETERIZATION     COLONOSCOPY N/A 10/12/2023   Procedure: COLONOSCOPY;  Surgeon: Hargis Lias, MD;  Location: [facility];  Service: Endoscopy;  Laterality: N/A;   CORONARY ARTERY BYPASS GRAFT     09-20-2012 x3 -[place]   ESOPHAGOGASTRODUODENOSCOPY N/A 10/11/2023   Procedure: EGD (ESOPHAGOGASTRODUODENOSCOPY);  Surgeon: Umberto Ganong, Bearl Limes, MD;  Location: [facility];  Service: Gastroenterology;  Laterality: N/A;   GIVENS CAPSULE STUDY N/A 10/26/2023   Procedure: IMAGING PROCEDURE, GI TRACT, INTRALUMINAL, VIA CAPSULE;  Surgeon: Umberto Ganong, Bearl Limes, MD;  Location: [facility];  Service: Gastroenterology;  Laterality: N/A;  730am  IR PERCUTANEOUS ART THROMBECTOMY/INFUSION INTRACRANIAL INC DIAG ANGIO  09/26/2016   LOOP RECORDER INSERTION N/A 09/30/2016   Procedure: Loop Recorder Insertion;  Surgeon: Jolly Needle, MD;  Location: [facility];  Service: Cardiovascular;  Laterality: N/A;   RADIOLOGY WITH ANESTHESIA N/A 09/26/2016   Procedure: RADIOLOGY WITH ANESTHESIA;  Surgeon: Luellen Sages, MD;  Location: [facility] OR;  Service: Radiology;  Laterality: N/A;   TEE WITHOUT CARDIOVERSION N/A 09/30/2016   Procedure: TRANSESOPHAGEAL ECHOCARDIOGRAM (TEE);  Surgeon: Darlis Eisenmenger, MD;  Location: [facility] ENDOSCOPY;  Service: Cardiovascular;  Laterality: N/A;   TOTAL HIP ARTHROPLASTY Right 04/26/2016   Procedure: RIGHT TOTAL HIP ARTHROPLASTY ANTERIOR APPROACH;  Surgeon: Claiborne Crew, MD;  Location: [facility] ORS;  Service: Orthopedics;  Laterality: Right;   VASECTOMY     WRIST SURGERY     implant of plastic  bone-mild ROM limits      Social History:     Social History   Tobacco Use   Smoking status: Former    Current packs/day: 0.00    Average packs/day: 1.5 packs/day for 15.0 years (22.5 ttl pk-yrs)    Types: Cigarettes    Start date: 04/20/1997    Quit date: 04/20/2012    Years since quitting: 11.5   Smokeless tobacco: Former    Types: Chew    Quit date: 04/20/2012  Substance Use Topics   Alcohol use: Yes    Comment: occ. use       Family History :     Family History  Problem Relation Age of Onset   Alzheimer's disease Mother      Home Medications:   Prior to Admission medications   Medication Sig Start Date End Date Taking? Authorizing Provider  acetaminophen  (TYLENOL ) 500 MG tablet Take 500 mg by mouth every 6 (six) hours as needed for fever or pain.   Yes [provider]  apixaban  (ELIQUIS ) 5 MG TABS tablet Take 1 tablet (5 mg total) by mouth 2 (two) times daily. 04/03/17  Yes Consuelo Denmark, MD  atorvastatin  (LIPITOR ) 80 MG tablet Take 80 mg by mouth daily. 03/23/16  Yes [provider]  carvedilol  (COREG ) 6.25 MG tablet Take 1 tablet (6.25 mg total) by mouth 2 (two) times daily with a meal. 10/13/23 01/11/24 Yes Shahmehdi, Seyed A, MD  docusate sodium  (COLACE) 100 MG capsule Take 1 capsule (100 mg total) by mouth 2 (two) times daily. 10/13/23 10/12/24 Yes Shahmehdi, Constantino Demark, MD  ferrous sulfate  325 (65 FE) MG EC tablet Take 1 tablet (325 mg total) by mouth daily with breakfast. 10/13/23  Yes Shahmehdi, Seyed A, MD  Multiple Vitamin (MULTIVITAMIN) capsule Take 1 capsule by mouth daily. For men > 50   Yes [provider]  pantoprazole  (PROTONIX ) 40 MG tablet Take 1 tablet (40 mg total) by mouth 2 (two) times daily. 10/13/23 11/12/23 Yes Shahmehdi, Seyed A, MD  sertraline  (ZOLOFT ) 50 MG tablet Take 50 mg by mouth daily. 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 rate 17, height 5' 10 (1.778 m), weight 90.7 kg, SpO2 99%.   1. General Well male, laying in bed, no  apparent distress  2. Normal affect and insight, Not Suicidal or Homicidal, Awake Alert, Oriented X 3.  3.  Patient with aphasia from his previous CVA at baseline, no gross motor deficits.   4. Ears and Eyes appear Normal, Conjunctivae clear, PERRLA. Moist Oral Mucosa.  5. Supple Neck, No JVD  6. Symmetrical Chest wall movement, Good air movement bilaterally  7. RRR, No Gallops, Rubs or Murmurs  8. Positive Bowel Sounds, Abdomen Soft, No tenderness, No organomegaly appriciated,No rebound -guarding or rigidity.  9.  No Cyanosis, Normal Skin Turgor, No Skin Rash or Bruise.  10. Good muscle tone,  joints appear normal , no effusions, Normal ROM.    Data Review:    CBC Recent Labs  Lab 10/27/23 1352  WBC 8.6  HGB 8.9*  HCT 30.2*  PLT 507*  MCV 78.9*  MCH 23.2*  MCHC 29.5*  RDW 16.1*   ------------------------------------------------------------------------------------------------------------------  Chemistries  Recent Labs  Lab 10/27/23 1352  NA 138  K 3.7  CL 103  CO2 24  GLUCOSE 95  BUN 9  CREATININE 0.78  CALCIUM  8.8*  AST 20  ALT 15  ALKPHOS 111  BILITOT 0.4   ------------------------------------------------------------------------------------------------------------------ estimated creatinine clearance is 100.1 mL/min (by C-G formula based on SCr of 0.78 mg/dL). ------------------------------------------------------------------------------------------------------------------ No results for input(s): TSH, T4TOTAL, T3FREE, THYROIDAB in the last 72 hours.  Invalid input(s): FREET3  Coagulation profile No  results for input(s): INR, PROTIME in the last 168 hours. ------------------------------------------------------------------------------------------------------------------- No results for input(s): DDIMER in the last 72 hours. -------------------------------------------------------------------------------------------------------------------  Cardiac Enzymes No results for input(s): CKMB, TROPONINI, MYOGLOBIN in the last 168 hours.  Invalid input(s): CK ------------------------------------------------------------------------------------------------------------------ No results found for: BNP   ---------------------------------------------------------------------------------------------------------------  Urinalysis    Component Value Date/Time   COLORURINE YELLOW 09/27/2016 0623   APPEARANCEUR CLOUDY (A) 09/27/2016 0623   LABSPEC >1.046 (H) 09/27/2016 0623   PHURINE 5.0 09/27/2016 0623   GLUCOSEU NEGATIVE 09/27/2016 0623   HGBUR SMALL (A) 09/27/2016 0623   BILIRUBINUR NEGATIVE 09/27/2016 0623   KETONESUR 5 (A) 09/27/2016 0623   PROTEINUR NEGATIVE 09/27/2016 0623   NITRITE NEGATIVE 09/27/2016 0623   LEUKOCYTESUR NEGATIVE 09/27/2016 0623    ----------------------------------------------------------------------------------------------------------------   Imaging Results:    No results found.    Assessment & Plan:    Principal Problem:   GI bleed Active Problems:   Cerebrovascular accident (CVA) due to thrombosis of precerebral artery (HCC)   Hyperlipidemia   Hx of CABG   Melena   Symptomatic anemia   Symptomatic anemia Acute blood loss anemia Acute GI bleed, mid small bowel -Patient presents with melena, hemoglobin slowly trending down, it is 8.9. - Order CBC closely, transfuse as needed, but so far no indication for transfusion, will keep active type and screen. - Continue to hold Eliquis . - Management per GI, discussed with GI on-call, patient  will need push enteroscopy, likely for today as he did not eat anything, and last think he had coffee earlier this morning. - Recent hospitalization hospitalization s/p EGD significant for erythematous duodenopathy ,otherwise within normal limits and colonoscopy 10/12/2023 significant for polyp in the cecum, and severe diverticulosis and nonbleeding internal hemorrhoids .    History of cryptogenic stroke-aphasic - Apixaban , remains on hold . - Resume statin after procedure    Essential hypertension - Recent hospitalization his amlodipine and retrolithiasis side has been discontinued due to soft blood pressure . - Continue with Coreg  (dose has been lowered during recent hospitalization as well)  - Will add as needed  hydralazine    Mixed hyperlipidemia  - Continue statin  History of depression -Continue with sertraline    Coronary Artery disease with history of CABG  - No chest pain presently shortness of breath, he is on Eliquis , so not on aspirin . -Continue with Coreg  and statin      DVT Prophylaxis  SCDs   AM Labs Ordered, also please review Full Orders  Family Communication: Admission, patients condition and plan of care including tests being ordered have been discussed with the patient and wife/daughter who indicate understanding and agree with the plan and Code Status.  Code Status Full  Likely DC to  home  Consults called: GI    Admission status: Observation    Time spent in minutes : 70 minutes   Seena Dadds M.D on 10/27/2023 at 3:53 PM   [facility] - Office  [phone]

## 2023-10-27 NOTE — Interval H&P Note (Signed)
 History and Physical Interval Note:  10/27/2023 4:53 PM  Johnny Sullivan  has presented today for surgery, with the diagnosis of small bowel bleeding, anemia.  The various methods of treatment have been discussed with the patient and family. After consideration of risks, benefits and other options for treatment, the patient has consented to  Procedure(s): ENTEROSCOPY (N/A) as a surgical intervention.  The patient's history has been reviewed, patient examined, no change in status, stable for surgery.  I have reviewed the patient's chart and labs.  Questions were answered to the patient's satisfaction.    We will proceed with push entersocopy as scheduled.    I thoroughly discussed with the patient the procedure, including the risks involved. Patient understands what the procedure involves including the benefits and any risks. Patient understands alternatives to the proposed procedure. Risks including (but not limited to) bleeding, tearing of the lining (perforation), rupture of adjacent organs, problems with heart and lung function, infection, and medication reactions. A small percentage of complications may require surgery, hospitalization, repeat endoscopic procedure, and/or transfusion.  Patient understood and agreed.    [name]

## 2023-10-27 NOTE — Consult Note (Signed)
 Gastroenterology Consult   Referring Provider: No ref. provider found Primary Care Physician:  Sina Dudley, MD Primary Gastroenterologist:  Urban Garden, MD  Patient ID: Johnny Sullivan; [id number]; 07-18-1955   Admit date: 10/27/2023  LOS: 0 days   Date of Consultation: 10/27/2023  Reason for Consultation:  anemia, active small bowel bleed identified on capsule study.   History of Present Illness   Johnny Sullivan is a 68 y.o. year old male with history of cryptogenic stroke with aphasia, GERD, HTN, CAD and MI s/p CABG who was recently discharged from the hospital 6/6 for admission secondary to anemia in which she underwent EGD and colonoscopy and was discharged with plans for outpatient capsule study.  Patient and family were contacted by Dr. Sammi Crick today with recommendations to go to the ED given on capsule study active oozing/bleeding was identified with consideration to perform enteroscopy.  Capsule study 10/26/23: Study complete to the cecum with good small bowel prep.  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  There was rapid transit of the capsule within the small bowel.  See study report.  Appears lesion is estimated at 57-60% of small bowel progress, possibly mid small bowel.  Bleeding suspected to be secondary to AVM versus dieulafoy lesion.  Dr. Sammi Crick advised patient's daughter to bring him to the ER for possible push enteroscopy, hold Eliquis  and will need to be on PPI twice daily.  ED course: Hypertensive -169/59.  Normal heart rate and respiratory rate. Hemoglobin 8.9, previously 9.8. Has been kept NPO.  Consult:   Had coffee at 9am this morning. Last dose Eliquis  yesterday afternoon about 3pm. Last meal about 4pm. Had BM this morning - denies melena. Dark/black stool on Tuesday. No BM Wednesday. Denies any abdominal pain, dizziness, lightheadedness. Has been frustrated about multiple times of nothing to eat/drink  given procedures but overall in good spirits. Wife and daughter at bedside and help to provide history. Denies any brbrp.   Recently seen patient on day of discharge 10/13/2023.  During this exam he denied any abdominal pain, nausea, vomiting, melena, or BRBPR but he had not had a bowel movement since prior to his colonoscopy prep.  He was tolerating diet and ready to go home.  Bowel sounds were present and abdomen was nondistended.  During this hospitalization his hemoglobin was noted to be 7.1 on day of admission.  It had been slowly down trending since 2023: 12>>11 (Nov 2024)>> 10.5 (09/11/2023)>> 10 (09/29/2023)>> 7.1 (10/09/2023, admission).  He received total 3 unit PRBCs and hemoglobin was 9.8 on discharge.  He had low iron levels as well on this admission.  Prior to this admission he had had 3 weeks of melena that resolved after stopping his Eliquis .  Procedures performed as below.  Eliquis  was resumed after procedures and was recommended close outpatient follow-up with lab work and capsule study.  Capsule study was discussed with patient and wife in detail.  EGD 10/11/23: - Normal esophagus - Normal stomach - Erythematous duodenopathy  Colonoscopy 10/12/2023: - 7 mm polyp in the cecum - Severe diverticulosis - Normal TI - Nonbleeding internal hemorrhoids  CBC 1 week after discharge with hemoglobin stable at 9.8.   Past Medical History:  Diagnosis Date   Arthritis    Osteoarthritis- right hip   Coronary artery disease    GERD (gastroesophageal reflux disease)    Hypertension    Myocardial infarction (HCC)     mild-Dr. Zakhary,cardiolgy-[place] follows -gives clearance.   Stroke [facility])  Urethral stricture    'uses self [place] caths weekly to correct.    Past Surgical History:  Procedure Laterality Date   BACK SURGERY     lumbar discectomy   CARDIAC CATHETERIZATION     COLONOSCOPY N/A 10/12/2023   Procedure: COLONOSCOPY;  Surgeon: Hargis Lias, MD;  Location: [facility];  Service: Endoscopy;  Laterality: N/A;   CORONARY ARTERY BYPASS GRAFT     09-20-2012 x3 -[place]   ESOPHAGOGASTRODUODENOSCOPY N/A 10/11/2023   Procedure: EGD (ESOPHAGOGASTRODUODENOSCOPY);  Surgeon: Umberto Ganong, Bearl Limes, MD;  Location: [facility];  Service: Gastroenterology;  Laterality: N/A;   GIVENS CAPSULE STUDY N/A 10/26/2023   Procedure: IMAGING PROCEDURE, GI TRACT, INTRALUMINAL, VIA CAPSULE;  Surgeon: Umberto Ganong, Bearl Limes, MD;  Location: [facility];  Service: Gastroenterology;  Laterality: N/A;  730am   IR PERCUTANEOUS ART THROMBECTOMY/INFUSION INTRACRANIAL INC DIAG ANGIO  09/26/2016   LOOP RECORDER INSERTION N/A 09/30/2016   Procedure: Loop Recorder Insertion;  Surgeon: Jolly Needle, MD;  Location: [facility] INVASIVE CV LAB;  Service: Cardiovascular;  Laterality: N/A;   RADIOLOGY WITH ANESTHESIA N/A 09/26/2016   Procedure: RADIOLOGY WITH ANESTHESIA;  Surgeon: Luellen Sages, MD;  Location: [facility] OR;  Service: Radiology;  Laterality: N/A;   TEE WITHOUT CARDIOVERSION N/A 09/30/2016   Procedure: TRANSESOPHAGEAL ECHOCARDIOGRAM (TEE);  Surgeon: Darlis Eisenmenger, MD;  Location: [facility] ENDOSCOPY;  Service: Cardiovascular;  Laterality: N/A;   TOTAL HIP ARTHROPLASTY Right 04/26/2016   Procedure: RIGHT TOTAL HIP ARTHROPLASTY ANTERIOR APPROACH;  Surgeon: Claiborne Crew, MD;  Location: [facility] ORS;  Service: Orthopedics;  Laterality: Right;   VASECTOMY     WRIST SURGERY     implant of plastic bone-mild ROM limits    Prior to Admission medications   Medication Sig Start Date End Date Taking? Authorizing Provider  acetaminophen  (TYLENOL ) 500 MG tablet Take 500 mg by mouth every 6 (six) hours as needed for fever or pain.    [provider]  apixaban  (ELIQUIS ) 5 MG TABS tablet Take 1 tablet (5 mg total) by mouth 2 (two) times daily. 04/03/17   Consuelo Denmark, MD  atorvastatin  (LIPITOR ) 80 MG tablet Take 80 mg by mouth daily. 03/23/16   [provider]  carvedilol  (COREG ) 6.25 MG  tablet Take 1 tablet (6.25 mg total) by mouth 2 (two) times daily with a meal. 10/13/23 01/11/24  Shahmehdi, Constantino Demark, MD  docusate sodium  (COLACE) 100 MG capsule Take 1 capsule (100 mg total) by mouth 2 (two) times daily. 10/13/23 10/12/24  Bobbetta Burnet, MD  ferrous sulfate  325 (65 FE) MG EC tablet Take 1 tablet (325 mg total) by mouth daily with breakfast. 10/13/23   Bobbetta Burnet, MD  Multiple Vitamin (MULTIVITAMIN) capsule Take 1 capsule by mouth daily. For men > 50    [provider]  pantoprazole  (PROTONIX ) 40 MG tablet Take 1 tablet (40 mg total) by mouth 2 (two) times daily. 10/13/23 11/12/23  Bobbetta Burnet, MD  sertraline  (ZOLOFT ) 50 MG tablet Take 50 mg by mouth daily. 06/03/17   [provider]    No current facility-administered medications for this encounter.   Current Outpatient Medications  Medication Sig Dispense Refill   acetaminophen  (TYLENOL ) 500 MG tablet Take 500 mg by mouth every 6 (six) hours as needed for fever or pain.     apixaban  (ELIQUIS ) 5 MG TABS tablet Take 1 tablet (5 mg total) by mouth 2 (two) times daily. 180 tablet 3   atorvastatin  (LIPITOR ) 80 MG tablet Take 80  mg by mouth daily.  3   carvedilol  (COREG ) 6.25 MG tablet Take 1 tablet (6.25 mg total) by mouth 2 (two) times daily with a meal. 30 tablet 2   docusate sodium  (COLACE) 100 MG capsule Take 1 capsule (100 mg total) by mouth 2 (two) times daily. 60 capsule 2   ferrous sulfate  325 (65 FE) MG EC tablet Take 1 tablet (325 mg total) by mouth daily with breakfast. 180 tablet 3   Multiple Vitamin (MULTIVITAMIN) capsule Take 1 capsule by mouth daily. For men > 50     pantoprazole  (PROTONIX ) 40 MG tablet Take 1 tablet (40 mg total) by mouth 2 (two) times daily. 30 tablet 1   sertraline  (ZOLOFT ) 50 MG tablet Take 50 mg by mouth daily.  12    Allergies as of 10/27/2023 - Review Complete 10/27/2023  Allergen Reaction Noted   Vancomycin Other (See Comments) 01/01/2013   Alteplase  Swelling and  Other (See Comments) 09/27/2016    Family History  Problem Relation Age of Onset   Alzheimer's disease Mother     Social History   Socioeconomic History   Marital status: Married    Spouse name: Mariah Shines   Number of children: 4   Years of education: 16   Highest education level: Not on file  Occupational History    Comment: retired from [employer]  Tobacco Use   Smoking status: Former    Current packs/day: 0.00    Average packs/day: 1.5 packs/day for 15.0 years (22.5 ttl pk-yrs)    Types: Cigarettes    Start date: 04/20/1997    Quit date: 04/20/2012    Years since quitting: 11.5   Smokeless tobacco: Former    Types: Chew    Quit date: 04/20/2012  Vaping Use   Vaping status: Never Used  Substance and Sexual Activity   Alcohol use: Yes    Comment: occ. use   Drug use: No   Sexual activity: Yes  Other Topics Concern   Not on file  Social History Narrative   Lives at home with wife   Caffeine- coffee, 2 cups daily, occas tea   Social Drivers of Corporate investment banker Strain: Not on file  Food Insecurity: No Food Insecurity (10/10/2023)   Hunger Vital Sign    Worried About Running Out of Food in the Last Year: Never true    Ran Out of Food in the Last Year: Never true  Transportation Needs: No Transportation Needs (10/10/2023)   PRAPARE - Administrator, Civil Service (Medical): No    Lack of Transportation (Non-Medical): No  Physical Activity: Not on file  Stress: Not on file  Social Connections: Socially Isolated (10/10/2023)   Social Connection and Isolation Panel    Frequency of Communication with Friends and Family: Never    Frequency of Social Gatherings with Friends and Family: Twice a week    Attends Religious Services: Never    Database administrator or Organizations: No    Attends Banker Meetings: Never    Marital Status: Married  Catering manager Violence: Not At Risk (10/10/2023)   Humiliation, Afraid, Rape, and Kick questionnaire     Fear of Current or Ex-Partner: No    Emotionally Abused: No    Physically Abused: No    Sexually Abused: No     Review of Systems   Gen: Denies any fever, chills, loss of appetite, change in weight or weight loss CV: Denies chest pain, heart palpitations, syncope,  edema  Resp: Denies shortness of breath with rest, cough, wheezing, coughing up blood, and pleurisy. GI: see HPI GU : Denies urinary burning, blood in urine, urinary frequency, and urinary incontinence. MS: Denies joint pain, limitation of movement, swelling, cramps, and atrophy.  Psych: Denies depression, anxiety, memory loss, hallucinations, and confusion. Heme: Denies bruising or bleeding Neuro:  + aphasia. Denies any headaches, dizziness, paresthesias, shaking  Physical Exam   Vital Signs in last 24 hours: Temp:  [98 F (36.7 C)] 98 F (36.7 C) (06/20 1326) Pulse Rate:  [61] 61 (06/20 1326) Resp:  [17] 17 (06/20 1326) BP: (169)/(59) 169/59 (06/20 1326) SpO2:  [99 %] 99 % (06/20 1326) Weight:  [90.7 kg] 90.7 kg (06/20 1327)    General:   Alert,  Well-developed, well-nourished, pleasant and cooperative in NAD Head:  Normocephalic and atraumatic. Eyes:  Sclera clear, no icterus.   Conjunctiva pink. Ears:  Normal auditory acuity. Mouth:  No deformity or lesions, dentition normal. Neck:  Supple; no masses Abdomen:  Soft, nontender and nondistended. No masses, hepatosplenomegaly or hernias noted. Normal bowel sounds, without guarding, and without rebound.   Rectal: deferred   Msk:  Symmetrical without gross deformities. Normal posture. Extremities:  Without clubbing or edema. Neurologic:  Alert and  oriented x4.  Aphasic Skin:  Intact without significant lesions or rashes. Psych:  Alert and cooperative. Normal mood and affect.  Intake/Output from previous day: No intake/output data recorded. Intake/Output this shift: No intake/output data recorded.  Labs/Studies   Recent Labs No results for input(s):  WBC, HGB, HCT, PLT in the last 72 hours. BMET No results for input(s): NA, K, CL, CO2, GLUCOSE, BUN, CREATININE, CALCIUM  in the last 72 hours. LFT No results for input(s): PROT, ALBUMIN, AST, ALT, ALKPHOS, BILITOT, BILIDIR, IBILI in the last 72 hours. PT/INR No results for input(s): LABPROT, INR in the last 72 hours. Hepatitis Panel No results for input(s): HEPBSAG, HCVAB, HEPAIGM, HEPBIGM in the last 72 hours. C-Diff No results for input(s): CDIFFTOX in the last 72 hours.  Radiology/Studies No results found.   Assessment   Johnny Sullivan is a 68 y.o. year old male   IDA, melena: -Most recent outpatient hemoglobin 6/12 stable at 9.8 from discharge on 6/6 -Hemoglobin on admission 8.9 -Possible melena seen this past Tuesday, no bowel movement Wednesday, no reports of melena this morning -Currently without any dizziness, chest pain, shortness of breath, BRBPR, abdominal pain -N.p.o. since yesterday afternoon about 4 PM other than black coffee this morning around 9 AM - EGD last admission with normal esophagus and stomach with erythematous duodenopathy - Colonoscopy last admission with 7 mm polyp removed from the cecum, severe diverticulosis, normal TI, nonbleeding internal hemorrhoids. - Capsule study completed yesterday and read this morning.  It was complete to the cecum with evidence of actively oozing dieulafoy lesion versus AVM, estimated about 50-60% progress in the small bowel (mid small bowel).  - Has continued on Eliquis  with last dose yesterday about 3 PM - Recommended enteroscopy for possible intervention of small bowel bleeding.  Discussion had with wife, patient, and daughter at bedside with myself and Dr. Alita Irwin in regards to risk versus benefits of the procedure.  Given he had his last dose of Eliquis  yesterday afternoon this is less than the recommended 48-hour hold window and that this means an increased risk of  bleeding especially with cauterization however if we were to wait an additional 24 hours for the procedure this could mean low yield for the procedure.  Risks including infection, perforation, and bleeding were discussed, Opportunity for questions were given.  They have agreed to proceed with procedure today given he has been NPO.  Vital signs are stable as well as labs.  Would recommend preparing blood just in case but no need to transfuse at this moment.   The possibility of transfer to [facility] if there was any further bleeding was discussed.  Pending labs and clinical course will determine need for inpatient transfer versus urgent outpatient referral.   Plan / Recommendations    PPI IV BID Hold Eliquis  Continue n.p.o. Continue oral iron tomorrow.  Prepare at least 1 unit PRBC, no need for transfusion at this time. Enteroscopy today with Dr. Sammi Crick    10/27/2023, 1:52 PM  Julian Obey, MSN, FNP-BC, AGACNP-BC [facility]

## 2023-10-27 NOTE — Brief Op Note (Signed)
 Patient underwent Enteroscopy  under propofol  sedation.  Tolerated the procedure adequately.   FINDINGS:  - Normal esophagus.   - Normal stomach.   - Normal examined duodenum.   - The examined portion of the jejunum was normal. Tattooed the distal extend of the small bowel reached .   - No specimens collected. No evidence of active or old bleeding seen throughout the examination  RECOMMENDATIONS  Keep NPO   CBC q12 hours  Given lesion is at 57% SBTT , had evidence of bleeding on SBE yesterday and clinical indication for anticoagulation ( strokes) ; will reach out to [facility] for anterograde double balloon enteroscopy  =====  I have initiated request for transfer and awaiting call back    Elizar Alpern Faizan Binnie Droessler, MD Gastroenterology and Hepatology [facility]

## 2023-10-27 NOTE — Procedures (Signed)
 Small Bowel [place] Capsule Study Procedure date: 10/26/2023  Referring Provider:  PCP PCP:  Dr. Sina Dudley, MD  Indication for procedure: Melena and iron deficiency anemia requiring transfusion.  Findings:  Study was adequate as capsule reached the cecum and the bowel preparation was adequate in the small bowel. There was presence of possible AVM in the small bowel with presence of active fresh bleeding.  There was rapid transit in the small bowel.  Lesion is likely located in the mid to small bowel.     Summary & Recommendations: Presence of active bleeding in the small bowel, possibly mid small bowel.  This was possibly related to an AVM versus Dieulafoy.  I discussed the findings with the patient's daughter.  Advised her to bring him to the ER for possible push enteroscopy.  He will need to hold his Eliquis  for now.  Will need to be kept on PPI twice daily.  ER will be notified as well.  I personally communicated these recommendations to the patient  [name], MD Gastroenterology and Hepatology [facility] G[facility]enter[facility]

## 2023-10-27 NOTE — H&P (View-Only) (Signed)
 Gastroenterology Consult   Referring Provider: No ref. provider found Primary Care Physician:  Sina Dudley, MD Primary Gastroenterologist:  Urban Garden, MD  Patient ID: Johnny Sullivan; [id number]; 07-18-1955   Admit date: 10/27/2023  LOS: 0 days   Date of Consultation: 10/27/2023  Reason for Consultation:  anemia, active small bowel bleed identified on capsule study.   History of Present Illness   Johnny Sullivan is a 68 y.o. year old male with history of cryptogenic stroke with aphasia, GERD, HTN, CAD and MI s/p CABG who was recently discharged from the hospital 6/6 for admission secondary to anemia in which she underwent EGD and colonoscopy and was discharged with plans for outpatient capsule study.  Patient and family were contacted by Dr. Sammi Crick today with recommendations to go to the ED given on capsule study active oozing/bleeding was identified with consideration to perform enteroscopy.  Capsule study 10/26/23: Study complete to the cecum with good small bowel prep.  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  There was rapid transit of the capsule within the small bowel.  See study report.  Appears lesion is estimated at 57-60% of small bowel progress, possibly mid small bowel.  Bleeding suspected to be secondary to AVM versus dieulafoy lesion.  Dr. Sammi Crick advised patient's daughter to bring him to the ER for possible push enteroscopy, hold Eliquis  and will need to be on PPI twice daily.  ED course: Hypertensive -169/59.  Normal heart rate and respiratory rate. Hemoglobin 8.9, previously 9.8. Has been kept NPO.  Consult:   Had coffee at 9am this morning. Last dose Eliquis  yesterday afternoon about 3pm. Last meal about 4pm. Had BM this morning - denies melena. Dark/black stool on Tuesday. No BM Wednesday. Denies any abdominal pain, dizziness, lightheadedness. Has been frustrated about multiple times of nothing to eat/drink  given procedures but overall in good spirits. Wife and daughter at bedside and help to provide history. Denies any brbrp.   Recently seen patient on day of discharge 10/13/2023.  During this exam he denied any abdominal pain, nausea, vomiting, melena, or BRBPR but he had not had a bowel movement since prior to his colonoscopy prep.  He was tolerating diet and ready to go home.  Bowel sounds were present and abdomen was nondistended.  During this hospitalization his hemoglobin was noted to be 7.1 on day of admission.  It had been slowly down trending since 2023: 12>>11 (Nov 2024)>> 10.5 (09/11/2023)>> 10 (09/29/2023)>> 7.1 (10/09/2023, admission).  He received total 3 unit PRBCs and hemoglobin was 9.8 on discharge.  He had low iron levels as well on this admission.  Prior to this admission he had had 3 weeks of melena that resolved after stopping his Eliquis .  Procedures performed as below.  Eliquis  was resumed after procedures and was recommended close outpatient follow-up with lab work and capsule study.  Capsule study was discussed with patient and wife in detail.  EGD 10/11/23: - Normal esophagus - Normal stomach - Erythematous duodenopathy  Colonoscopy 10/12/2023: - 7 mm polyp in the cecum - Severe diverticulosis - Normal TI - Nonbleeding internal hemorrhoids  CBC 1 week after discharge with hemoglobin stable at 9.8.   Past Medical History:  Diagnosis Date   Arthritis    Osteoarthritis- right hip   Coronary artery disease    GERD (gastroesophageal reflux disease)    Hypertension    Myocardial infarction (HCC)     mild-Dr. Zakhary,cardiolgy-[place] follows -gives clearance.   Stroke [facility])  Urethral stricture    'uses self [place] caths weekly to correct.    Past Surgical History:  Procedure Laterality Date   BACK SURGERY     lumbar discectomy   CARDIAC CATHETERIZATION     COLONOSCOPY N/A 10/12/2023   Procedure: COLONOSCOPY;  Surgeon: Hargis Lias, MD;  Location: [facility] ENDO  SUITE;  Service: Endoscopy;  Laterality: N/A;   CORONARY ARTERY BYPASS GRAFT     09-20-2012 x3 -[place]   ESOPHAGOGASTRODUODENOSCOPY N/A 10/11/2023   Procedure: EGD (ESOPHAGOGASTRODUODENOSCOPY);  Surgeon: Umberto Ganong, Bearl Limes, MD;  Location: [facility] ENDO SUITE;  Service: Gastroenterology;  Laterality: N/A;   GIVENS CAPSULE STUDY N/A 10/26/2023   Procedure: IMAGING PROCEDURE, GI TRACT, INTRALUMINAL, VIA CAPSULE;  Surgeon: Umberto Ganong, Bearl Limes, MD;  Location: [facility] ENDO SUITE;  Service: Gastroenterology;  Laterality: N/A;  730am   IR PERCUTANEOUS ART THROMBECTOMY/INFUSION INTRACRANIAL INC DIAG ANGIO  09/26/2016   LOOP RECORDER INSERTION N/A 09/30/2016   Procedure: Loop Recorder Insertion;  Surgeon: Jolly Needle, MD;  Location: [facility] INVASIVE CV LAB;  Service: Cardiovascular;  Laterality: N/A;   RADIOLOGY WITH ANESTHESIA N/A 09/26/2016   Procedure: RADIOLOGY WITH ANESTHESIA;  Surgeon: Luellen Sages, MD;  Location: [facility] OR;  Service: Radiology;  Laterality: N/A;   TEE WITHOUT CARDIOVERSION N/A 09/30/2016   Procedure: TRANSESOPHAGEAL ECHOCARDIOGRAM (TEE);  Surgeon: Darlis Eisenmenger, MD;  Location: [facility] ENDOSCOPY;  Service: Cardiovascular;  Laterality: N/A;   TOTAL HIP ARTHROPLASTY Right 04/26/2016   Procedure: RIGHT TOTAL HIP ARTHROPLASTY ANTERIOR APPROACH;  Surgeon: Claiborne Crew, MD;  Location: [facility] ORS;  Service: Orthopedics;  Laterality: Right;   VASECTOMY     WRIST SURGERY     implant of plastic bone-mild ROM limits    Prior to Admission medications   Medication Sig Start Date End Date Taking? Authorizing Provider  acetaminophen  (TYLENOL ) 500 MG tablet Take 500 mg by mouth every 6 (six) hours as needed for fever or pain.    [provider]  apixaban  (ELIQUIS ) 5 MG TABS tablet Take 1 tablet (5 mg total) by mouth 2 (two) times daily. 04/03/17   Consuelo Denmark, MD  atorvastatin  (LIPITOR ) 80 MG tablet Take 80 mg by mouth daily. 03/23/16   [provider]  carvedilol  (COREG ) 6.25 MG  tablet Take 1 tablet (6.25 mg total) by mouth 2 (two) times daily with a meal. 10/13/23 01/11/24  Shahmehdi, Constantino Demark, MD  docusate sodium  (COLACE) 100 MG capsule Take 1 capsule (100 mg total) by mouth 2 (two) times daily. 10/13/23 10/12/24  Bobbetta Burnet, MD  ferrous sulfate  325 (65 FE) MG EC tablet Take 1 tablet (325 mg total) by mouth daily with breakfast. 10/13/23   Bobbetta Burnet, MD  Multiple Vitamin (MULTIVITAMIN) capsule Take 1 capsule by mouth daily. For men > 50    [provider]  pantoprazole  (PROTONIX ) 40 MG tablet Take 1 tablet (40 mg total) by mouth 2 (two) times daily. 10/13/23 11/12/23  Bobbetta Burnet, MD  sertraline  (ZOLOFT ) 50 MG tablet Take 50 mg by mouth daily. 06/03/17   [provider]    No current facility-administered medications for this encounter.   Current Outpatient Medications  Medication Sig Dispense Refill   acetaminophen  (TYLENOL ) 500 MG tablet Take 500 mg by mouth every 6 (six) hours as needed for fever or pain.     apixaban  (ELIQUIS ) 5 MG TABS tablet Take 1 tablet (5 mg total) by mouth 2 (two) times daily. 180 tablet 3   atorvastatin  (LIPITOR ) 80 MG tablet Take 80  mg by mouth daily.  3   carvedilol  (COREG ) 6.25 MG tablet Take 1 tablet (6.25 mg total) by mouth 2 (two) times daily with a meal. 30 tablet 2   docusate sodium  (COLACE) 100 MG capsule Take 1 capsule (100 mg total) by mouth 2 (two) times daily. 60 capsule 2   ferrous sulfate  325 (65 FE) MG EC tablet Take 1 tablet (325 mg total) by mouth daily with breakfast. 180 tablet 3   Multiple Vitamin (MULTIVITAMIN) capsule Take 1 capsule by mouth daily. For men > 50     pantoprazole  (PROTONIX ) 40 MG tablet Take 1 tablet (40 mg total) by mouth 2 (two) times daily. 30 tablet 1   sertraline  (ZOLOFT ) 50 MG tablet Take 50 mg by mouth daily.  12    Allergies as of 10/27/2023 - Review Complete 10/27/2023  Allergen Reaction Noted   Vancomycin Other (See Comments) 01/01/2013   Alteplase  Swelling and  Other (See Comments) 09/27/2016    Family History  Problem Relation Age of Onset   Alzheimer's disease Mother     Social History   Socioeconomic History   Marital status: Married    Spouse name: Mariah Shines   Number of children: 4   Years of education: 16   Highest education level: Not on file  Occupational History    Comment: retired from [employer]  Tobacco Use   Smoking status: Former    Current packs/day: 0.00    Average packs/day: 1.5 packs/day for 15.0 years (22.5 ttl pk-yrs)    Types: Cigarettes    Start date: 04/20/1997    Quit date: 04/20/2012    Years since quitting: 11.5   Smokeless tobacco: Former    Types: Chew    Quit date: 04/20/2012  Vaping Use   Vaping status: Never Used  Substance and Sexual Activity   Alcohol use: Yes    Comment: occ. use   Drug use: No   Sexual activity: Yes  Other Topics Concern   Not on file  Social History Narrative   Lives at home with wife   Caffeine- coffee, 2 cups daily, occas tea   Social Drivers of Corporate investment banker Strain: Not on file  Food Insecurity: No Food Insecurity (10/10/2023)   Hunger Vital Sign    Worried About Running Out of Food in the Last Year: Never true    Ran Out of Food in the Last Year: Never true  Transportation Needs: No Transportation Needs (10/10/2023)   PRAPARE - Administrator, Civil Service (Medical): No    Lack of Transportation (Non-Medical): No  Physical Activity: Not on file  Stress: Not on file  Social Connections: Socially Isolated (10/10/2023)   Social Connection and Isolation Panel    Frequency of Communication with Friends and Family: Never    Frequency of Social Gatherings with Friends and Family: Twice a week    Attends Religious Services: Never    Database administrator or Organizations: No    Attends Banker Meetings: Never    Marital Status: Married  Catering manager Violence: Not At Risk (10/10/2023)   Humiliation, Afraid, Rape, and Kick questionnaire     Fear of Current or Ex-Partner: No    Emotionally Abused: No    Physically Abused: No    Sexually Abused: No     Review of Systems   Gen: Denies any fever, chills, loss of appetite, change in weight or weight loss CV: Denies chest pain, heart palpitations, syncope,  edema  Resp: Denies shortness of breath with rest, cough, wheezing, coughing up blood, and pleurisy. GI: see HPI GU : Denies urinary burning, blood in urine, urinary frequency, and urinary incontinence. MS: Denies joint pain, limitation of movement, swelling, cramps, and atrophy.  Psych: Denies depression, anxiety, memory loss, hallucinations, and confusion. Heme: Denies bruising or bleeding Neuro:  + aphasia. Denies any headaches, dizziness, paresthesias, shaking  Physical Exam   Vital Signs in last 24 hours: Temp:  [98 F (36.7 C)] 98 F (36.7 C) (06/20 1326) Pulse Rate:  [61] 61 (06/20 1326) Resp:  [17] 17 (06/20 1326) BP: (169)/(59) 169/59 (06/20 1326) SpO2:  [99 %] 99 % (06/20 1326) Weight:  [90.7 kg] 90.7 kg (06/20 1327)    General:   Alert,  Well-developed, well-nourished, pleasant and cooperative in NAD Head:  Normocephalic and atraumatic. Eyes:  Sclera clear, no icterus.   Conjunctiva pink. Ears:  Normal auditory acuity. Mouth:  No deformity or lesions, dentition normal. Neck:  Supple; no masses Abdomen:  Soft, nontender and nondistended. No masses, hepatosplenomegaly or hernias noted. Normal bowel sounds, without guarding, and without rebound.   Rectal: deferred   Msk:  Symmetrical without gross deformities. Normal posture. Extremities:  Without clubbing or edema. Neurologic:  Alert and  oriented x4.  Aphasic Skin:  Intact without significant lesions or rashes. Psych:  Alert and cooperative. Normal mood and affect.  Intake/Output from previous day: No intake/output data recorded. Intake/Output this shift: No intake/output data recorded.  Labs/Studies   Recent Labs No results for input(s):  WBC, HGB, HCT, PLT in the last 72 hours. BMET No results for input(s): NA, K, CL, CO2, GLUCOSE, BUN, CREATININE, CALCIUM  in the last 72 hours. LFT No results for input(s): PROT, ALBUMIN, AST, ALT, ALKPHOS, BILITOT, BILIDIR, IBILI in the last 72 hours. PT/INR No results for input(s): LABPROT, INR in the last 72 hours. Hepatitis Panel No results for input(s): HEPBSAG, HCVAB, HEPAIGM, HEPBIGM in the last 72 hours. C-Diff No results for input(s): CDIFFTOX in the last 72 hours.  Radiology/Studies No results found.   Assessment   Johnny Sullivan is a 68 y.o. year old male   IDA, melena: -Most recent outpatient hemoglobin 6/12 stable at 9.8 from discharge on 6/6 -Hemoglobin on admission 8.9 -Possible melena seen this past Tuesday, no bowel movement Wednesday, no reports of melena this morning -Currently without any dizziness, chest pain, shortness of breath, BRBPR, abdominal pain -N.p.o. since yesterday afternoon about 4 PM other than black coffee this morning around 9 AM - EGD last admission with normal esophagus and stomach with erythematous duodenopathy - Colonoscopy last admission with 7 mm polyp removed from the cecum, severe diverticulosis, normal TI, nonbleeding internal hemorrhoids. - Capsule study completed yesterday and read this morning.  It was complete to the cecum with evidence of actively oozing dieulafoy lesion versus AVM, estimated about 50-60% progress in the small bowel (mid small bowel).  - Has continued on Eliquis  with last dose yesterday about 3 PM - Recommended enteroscopy for possible intervention of small bowel bleeding.  Discussion had with wife, patient, and daughter at bedside with myself and Dr. Alita Irwin in regards to risk versus benefits of the procedure.  Given he had his last dose of Eliquis  yesterday afternoon this is less than the recommended 48-hour hold window and that this means an increased risk of  bleeding especially with cauterization however if we were to wait an additional 24 hours for the procedure this could mean low yield for the procedure.  Risks including infection, perforation, and bleeding were discussed, Opportunity for questions were given.  They have agreed to proceed with procedure today given he has been NPO.  Vital signs are stable as well as labs.  Would recommend preparing blood just in case but no need to transfuse at this moment.   The possibility of transfer to [facility] if there was any further bleeding was discussed.  Pending labs and clinical course will determine need for inpatient transfer versus urgent outpatient referral.   Plan / Recommendations    PPI IV BID Hold Eliquis  Continue n.p.o. Continue oral iron tomorrow.  Prepare at least 1 unit PRBC, no need for transfusion at this time. Enteroscopy today with Dr. Sammi Crick    10/27/2023, 1:52 PM  Julian Obey, MSN, FNP-BC, AGACNP-BC [facility]

## 2023-10-27 NOTE — Telephone Encounter (Signed)
 Patient wife Mariah Shines [facility]) called today stating the patient had a capsule study yesterday and he has started having issues again with Tarry stools two days ago. She says he denies any shortness of breath, weakness, dizziness. Patient says he is feeling fine. Patient has started back on his Eliquis  5 mg bid at discharge. I advised if the patient did start having issues with fatigue, shortness of breath, dizziness to report to the Ed. She states understanding of this.   She also wants to know if the patient should continue this or hold it. She says patient will also need Pantoprazole  40 mg Bid sent to [facility], [place]. [address] [place]. Please advise.

## 2023-10-27 NOTE — Telephone Encounter (Signed)
 Spoke to the patient's daughter regarding most recent findings of capsule endoscopy showing active bleeding in the small bowel, possibly mid small bowel.  She stated that he last took his Eliquis  yesterday night.  I advised her to bring him to the ER for further evaluation, he will need a push enteroscopy as discussed with the attempt to reach the area with active bleeding.  I also explained that if unable to reach this area, we will need to reach [facility] to possible anterograde double-balloon enteroscopy.  She understood and agreed, he will be brought to the ER.

## 2023-10-28 DIAGNOSIS — K219 Gastro-esophageal reflux disease without esophagitis: Secondary | ICD-10-CM | POA: Diagnosis present

## 2023-10-28 DIAGNOSIS — I252 Old myocardial infarction: Secondary | ICD-10-CM | POA: Diagnosis not present

## 2023-10-28 DIAGNOSIS — K5730 Diverticulosis of large intestine without perforation or abscess without bleeding: Secondary | ICD-10-CM | POA: Diagnosis present

## 2023-10-28 DIAGNOSIS — I6300 Cerebral infarction due to thrombosis of unspecified precerebral artery: Secondary | ICD-10-CM

## 2023-10-28 DIAGNOSIS — Z87891 Personal history of nicotine dependence: Secondary | ICD-10-CM | POA: Diagnosis not present

## 2023-10-28 DIAGNOSIS — E782 Mixed hyperlipidemia: Secondary | ICD-10-CM | POA: Diagnosis present

## 2023-10-28 DIAGNOSIS — Z860100 Personal history of colon polyps, unspecified: Secondary | ICD-10-CM | POA: Diagnosis not present

## 2023-10-28 DIAGNOSIS — Z888 Allergy status to other drugs, medicaments and biological substances status: Secondary | ICD-10-CM | POA: Diagnosis not present

## 2023-10-28 DIAGNOSIS — F32A Depression, unspecified: Secondary | ICD-10-CM | POA: Diagnosis present

## 2023-10-28 DIAGNOSIS — Z7901 Long term (current) use of anticoagulants: Secondary | ICD-10-CM | POA: Diagnosis not present

## 2023-10-28 DIAGNOSIS — D62 Acute posthemorrhagic anemia: Secondary | ICD-10-CM | POA: Diagnosis present

## 2023-10-28 DIAGNOSIS — Z881 Allergy status to other antibiotic agents status: Secondary | ICD-10-CM | POA: Diagnosis not present

## 2023-10-28 DIAGNOSIS — Z951 Presence of aortocoronary bypass graft: Secondary | ICD-10-CM | POA: Diagnosis not present

## 2023-10-28 DIAGNOSIS — Z820 Family history of epilepsy and other diseases of the nervous system: Secondary | ICD-10-CM | POA: Diagnosis not present

## 2023-10-28 DIAGNOSIS — K921 Melena: Secondary | ICD-10-CM | POA: Diagnosis present

## 2023-10-28 DIAGNOSIS — D649 Anemia, unspecified: Secondary | ICD-10-CM | POA: Diagnosis not present

## 2023-10-28 DIAGNOSIS — I69320 Aphasia following cerebral infarction: Secondary | ICD-10-CM | POA: Diagnosis not present

## 2023-10-28 DIAGNOSIS — I2510 Atherosclerotic heart disease of native coronary artery without angina pectoris: Secondary | ICD-10-CM | POA: Diagnosis present

## 2023-10-28 DIAGNOSIS — K648 Other hemorrhoids: Secondary | ICD-10-CM | POA: Diagnosis present

## 2023-10-28 DIAGNOSIS — I10 Essential (primary) hypertension: Secondary | ICD-10-CM | POA: Diagnosis present

## 2023-10-28 DIAGNOSIS — K922 Gastrointestinal hemorrhage, unspecified: Secondary | ICD-10-CM | POA: Diagnosis present

## 2023-10-28 DIAGNOSIS — Z79899 Other long term (current) drug therapy: Secondary | ICD-10-CM | POA: Diagnosis not present

## 2023-10-28 DIAGNOSIS — Z96641 Presence of right artificial hip joint: Secondary | ICD-10-CM | POA: Diagnosis present

## 2023-10-28 DIAGNOSIS — K3182 Dieulafoy lesion (hemorrhagic) of stomach and duodenum: Secondary | ICD-10-CM | POA: Diagnosis present

## 2023-10-28 LAB — BASIC METABOLIC PANEL WITH GFR
Anion gap: 8 (ref 5–15)
BUN: 8 mg/dL (ref 8–23)
CO2: 26 mmol/L (ref 22–32)
Calcium: 8.5 mg/dL — ABNORMAL LOW (ref 8.9–10.3)
Chloride: 105 mmol/L (ref 98–111)
Creatinine, Ser: 0.72 mg/dL (ref 0.61–1.24)
GFR, Estimated: >60 mL/min (ref >60)
Glucose, Bld: 102 mg/dL — ABNORMAL HIGH (ref 70–99)
Potassium: 3.8 mmol/L (ref 3.5–5.1)
Sodium: 139 mmol/L (ref 135–145)

## 2023-10-28 LAB — CBC
HCT: 26.7 % — ABNORMAL LOW (ref 39.0–52.0)
HCT: 27.9 % — ABNORMAL LOW (ref 39.0–52.0)
Hemoglobin: 7.9 g/dL — ABNORMAL LOW (ref 13.0–17.0)
Hemoglobin: 8.3 g/dL — ABNORMAL LOW (ref 13.0–17.0)
MCH: 23.1 pg — ABNORMAL LOW (ref 26.0–34.0)
MCH: 23.3 pg — ABNORMAL LOW (ref 26.0–34.0)
MCHC: 29.6 g/dL — ABNORMAL LOW (ref 30.0–36.0)
MCHC: 29.7 g/dL — ABNORMAL LOW (ref 30.0–36.0)
MCV: 78.1 fL — ABNORMAL LOW (ref 80.0–100.0)
MCV: 78.4 fL — ABNORMAL LOW (ref 80.0–100.0)
Platelets: 429 10*3/uL — ABNORMAL HIGH (ref 150–400)
Platelets: 458 10*3/uL — ABNORMAL HIGH (ref 150–400)
RBC: 3.42 MIL/uL — ABNORMAL LOW (ref 4.22–5.81)
RBC: 3.56 MIL/uL — ABNORMAL LOW (ref 4.22–5.81)
RDW: 16 % — ABNORMAL HIGH (ref 11.5–15.5)
RDW: 16.4 % — ABNORMAL HIGH (ref 11.5–15.5)
WBC: 10.6 10*3/uL — ABNORMAL HIGH (ref 4.0–10.5)
WBC: 12.6 10*3/uL — ABNORMAL HIGH (ref 4.0–10.5)
nRBC: 0 % (ref 0.0–0.2)
nRBC: 0 % (ref 0.0–0.2)

## 2023-10-28 NOTE — Hospital Course (Addendum)
 68 y.o. 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 -Him instructed to come to ED by his GI physician as he had a capsule endoscopy done yesterday, which was significant for active GI bleed in small bowel, most likely midsection, patient/wife at bedside assists with the history reports he is still having active melena, last time he took Eliquis  was yesterday at 2 PM, he did not eat anything today, last thing he had was black coffee at 9 AM. -In ED his hemoglobin at 8.9, was 9.8 on 10/19/2023, otherwise no significant labs abnormalities, he was typed and screened, and [facility] hospitalist consulted to admit, plan for enteroscopy this afternoon as discussed with GI.   Dr. Cinderella arranged for transfer to [facility] for balloon enteroscopy likely on Sun 6/22 in anticipation for procedure to be done on 6/23.

## 2023-10-28 NOTE — Care Management Obs Status (Signed)
 MEDICARE OBSERVATION STATUS NOTIFICATION   Patient Details  Name: Johnny Sullivan MRN: [id number] Date of Birth: 02/23/1956   Medicare Observation Status Notification Given:  Yes    Nena LITTIE Coffee, RN 10/28/2023, 11:22 AM

## 2023-10-28 NOTE — Plan of Care (Signed)

## 2023-10-28 NOTE — Plan of Care (Signed)
  Problem: Pain Managment: Goal: General experience of comfort will improve and/or be controlled Outcome: Progressing   Problem: Safety: Goal: Ability to remain free from injury will improve Outcome: Progressing   Problem: Skin Integrity: Goal: Risk for impaired skin integrity will decrease Outcome: Progressing

## 2023-10-28 NOTE — Progress Notes (Signed)
 PROGRESS NOTE   Johnny Sullivan  FMW:[id number] DOB: [birth year] DOA: 10/27/2023 PCP: [name], MD   Chief Complaint  Patient presents with   GI Bleeding   Level of care: Telemetry  Brief Admission History:  68 y.o. 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 -Him instructed to come to ED by his GI physician as he had a capsule endoscopy done yesterday, which was significant for active GI bleed in small bowel, most likely midsection, patient/wife at bedside assists with the history reports he is still having active melena, last time he took Eliquis  was yesterday at 2 PM, he did not eat anything today, last thing he had was black coffee at 9 AM. -In ED his hemoglobin at 8.9, was 9.8 on 10/19/2023, otherwise no significant labs abnormalities, he was typed and screened, and [facility] hospitalist consulted to admit, plan for enteroscopy this afternoon as discussed with GI.   Dr. Cinderella arranged for transfer to [facility] for balloon enteroscopy likely on Sun 6/22 in anticipation for procedure to be done on 6/23.     Assessment and Plan:  Symptomatic anemia Acute blood loss anemia Acute GI bleed, mid small bowel -Patient presents with melena, hemoglobin slowly trending down, it is 8.9. - Order CBC closely, transfuse as needed, but so far no indication for transfusion, will keep active type and screen. - Continue to hold Eliquis . - Management per GI, discussed with GI on-call, patient will need push enteroscopy, likely for today as he  did not eat anything, and last think he had coffee earlier this morning. - Recent hospitalization hospitalization s/p EGD significant for erythematous duodenopathy ,otherwise within normal limits and colonoscopy 10/12/2023 significant for polyp in the cecum, and severe diverticulosis and nonbleeding internal hemorrhoids  - GI team arranged for transfer to [facility] on 6/22    History of cryptogenic stroke-aphasic - Apixaban , remains on hold . - Resume statin after procedure    Essential hypertension - Recent hospitalization his amlodipine and retrolithiasis side has been discontinued due to soft blood pressure . - Continue with Coreg  (dose has been lowered during recent hospitalization as well)  - Will add as needed hydralazine     Mixed hyperlipidemia  - Continue statin   History of depression -Continue with sertraline    Coronary Artery disease with history of CABG  - No chest pain presently shortness of breath, he is on Eliquis , so not on aspirin . -Continue with Coreg  and statin   DVT prophylaxis: scds Code Status: full  Family Communication:  Disposition: transfer to [facility] on 6/22 per GI   Consultants:  GI  Procedures:   Antimicrobials:    Subjective: Pt reports no specific complaints.   Objective: Vitals:   10/27/23 2102 10/27/23 2114 10/28/23 0512 10/28/23 1300  BP: (!) 135/51 (!) 124/48 (!) 122/58 (!) 97/49  Pulse: 65 61 77 80  Resp: 18 18 17 16   Temp: 98.2 F (36.8 C) 98.3 F (36.8 C) 98.5 F (36.9 C) 98.4 F (36.9 C)  TempSrc: Oral Oral Oral Oral  SpO2: 95% 96% 96% 94%  Weight:      Height:        Intake/Output Summary (Last  24 hours) at 10/28/2023 1646 Last data filed at 10/28/2023 1413 Gross per 24 hour  Intake 760 ml  Output --  Net 760 ml   Filed Weights   10/27/23 1327 10/27/23 1629  Weight: 90.7 kg 90.7 kg   Examination:  General exam: Appears calm and comfortable  Respiratory system: Clear to auscultation. Respiratory effort  normal. Cardiovascular system: normal S1 & S2 heard. No JVD, murmurs, rubs, gallops or clicks. No pedal edema. Gastrointestinal system: Abdomen is nondistended, soft and nontender. No organomegaly or masses felt. Normal bowel sounds heard. Central nervous system: Alert and oriented. No focal neurological deficits. Extremities: Symmetric 5 x 5 power. Skin: No rashes, lesions or ulcers. Psychiatry: Judgement and insight appear normal. Mood & affect appropriate.   Data Reviewed: I have personally reviewed following labs and imaging studies  CBC: Recent Labs  Lab 10/27/23 1352 10/28/23 0004 10/28/23 0449  WBC 8.6 12.6* 10.6*  HGB 8.9* 8.3* 7.9*  HCT 30.2* 27.9* 26.7*  MCV 78.9* 78.4* 78.1*  PLT 507* 429* 458*    Basic Metabolic Panel: Recent Labs  Lab 10/27/23 1352 10/28/23 0449  NA 138 139  K 3.7 3.8  CL 103 105  CO2 24 26  GLUCOSE 95 102*  BUN 9 8  CREATININE 0.78 0.72  CALCIUM  8.8* 8.5*    CBG: No results for input(s): GLUCAP in the last 168 hours.  No results found for this or any previous visit (from the past 240 hours).   Radiology Studies: No results found.  Scheduled Meds:  atorvastatin   80 mg Oral Daily   carvedilol   6.25 mg Oral BID WC   multivitamin with minerals  1 tablet Oral Daily   pantoprazole   40 mg Oral BID   sertraline   50 mg Oral Daily   Continuous Infusions:  lactated ringers  40 mL/hr at 10/27/23 2119    LOS: 0 days   Time spent: 50 mins  Rane Blitch Vicci, MD How to contact the [facility] Attending or Consulting provider 7A - 7P or covering provider during after hours 7P -7A, for this patient?  Check the care team in [facility] and look for a) attending/consulting [facility] provider listed and b) the [facility] team listed Log into [email or web address] to find provider on call.  Locate the [facility] provider you are looking for under [facility] and page to a number that you can be directly reached. If you still have difficulty reaching the provider, please page the  [facility] (Director on Call) for the Hospitalists listed on amion for assistance.  10/28/2023, 4:46 PM

## 2023-10-28 NOTE — Progress Notes (Signed)
   10/28/23 1207  TOC Brief Assessment  Insurance and Status Reviewed  Patient has primary care physician Yes  Home environment has been reviewed From home c/wife  Prior level of function: Independent  Prior/Current Home Services No current home services  Social Drivers of Health Review SDOH reviewed no interventions necessary  Readmission risk has been reviewed Yes  Transition of care needs no transition of care needs at this time   Transition of Care Department [facility]) has reviewed patient and no TOC needs have been identified at this time. We will continue to monitor patient advancement through interdisciplinary progression rounds. If new patient transition needs arise, please place a TOC consult.

## 2023-10-29 ENCOUNTER — Encounter (HOSPITAL_COMMUNITY): Payer: Self-pay | Admitting: Family Medicine

## 2023-10-29 DIAGNOSIS — D649 Anemia, unspecified: Secondary | ICD-10-CM | POA: Diagnosis not present

## 2023-10-29 DIAGNOSIS — E785 Hyperlipidemia, unspecified: Secondary | ICD-10-CM

## 2023-10-29 DIAGNOSIS — K921 Melena: Secondary | ICD-10-CM | POA: Diagnosis not present

## 2023-10-29 DIAGNOSIS — Z951 Presence of aortocoronary bypass graft: Secondary | ICD-10-CM

## 2023-10-29 DIAGNOSIS — K922 Gastrointestinal hemorrhage, unspecified: Secondary | ICD-10-CM | POA: Diagnosis not present

## 2023-10-29 LAB — CBC
HCT: 26.5 % — ABNORMAL LOW (ref 39.0–52.0)
Hemoglobin: 7.8 g/dL — ABNORMAL LOW (ref 13.0–17.0)
MCH: 23 pg — ABNORMAL LOW (ref 26.0–34.0)
MCHC: 29.4 g/dL — ABNORMAL LOW (ref 30.0–36.0)
MCV: 78.2 fL — ABNORMAL LOW (ref 80.0–100.0)
Platelets: 454 10*3/uL — ABNORMAL HIGH (ref 150–400)
RBC: 3.39 MIL/uL — ABNORMAL LOW (ref 4.22–5.81)
RDW: 16.3 % — ABNORMAL HIGH (ref 11.5–15.5)
WBC: 7.4 10*3/uL (ref 4.0–10.5)
nRBC: 0 % (ref 0.0–0.2)

## 2023-10-29 LAB — BASIC METABOLIC PANEL WITH GFR
Anion gap: 7 (ref 5–15)
BUN: 7 mg/dL — ABNORMAL LOW (ref 8–23)
CO2: 26 mmol/L (ref 22–32)
Calcium: 8.5 mg/dL — ABNORMAL LOW (ref 8.9–10.3)
Chloride: 107 mmol/L (ref 98–111)
Creatinine, Ser: 0.73 mg/dL (ref 0.61–1.24)
GFR, Estimated: >60 mL/min (ref >60)
Glucose, Bld: 97 mg/dL (ref 70–99)
Potassium: 3.8 mmol/L (ref 3.5–5.1)
Sodium: 140 mmol/L (ref 135–145)

## 2023-10-29 MED ORDER — PANTOPRAZOLE SODIUM 40 MG PO TBEC: 40.0000 mg | DELAYED_RELEASE_TABLET | Freq: Two times a day (BID) | ORAL | Status: AC

## 2023-10-29 MED ORDER — MULTIVITAMINS PO CAPS: 1.0000 | ORAL_CAPSULE | Freq: Every day | ORAL | Status: AC

## 2023-10-29 MED ORDER — ATORVASTATIN CALCIUM 80 MG PO TABS
80.0000 mg | ORAL_TABLET | Freq: Every day | ORAL | Status: AC
Start: 2023-10-29 — End: ?

## 2023-10-29 MED ORDER — SERTRALINE HCL 50 MG PO TABS
50.0000 mg | ORAL_TABLET | Freq: Every day | ORAL | Status: AC
Start: 2023-10-29 — End: ?

## 2023-10-29 MED ORDER — CARVEDILOL 6.25 MG PO TABS: 6.2500 mg | ORAL_TABLET | Freq: Two times a day (BID) | ORAL | Status: AC

## 2023-10-29 NOTE — Plan of Care (Signed)

## 2023-10-29 NOTE — Discharge Summary (Signed)
 Physician Discharge Summary  Johnny Sullivan FMW:[id number] DOB: [birth year] DOA: 10/27/2023  PCP: [name], MD  Admit date: 10/27/2023 Discharge date: 10/29/2023  Disposition: TRANSFER TO [facility] FOR GI PROCEDURE  Home Health: NA   Discharge Condition: STABLE   CODE STATUS: FULL DIET: clear liquid diet as tolerated    Brief Hospitalization Summary: Please see all hospital notes, images, labs for full details of the hospitalization. Admission provider HPI:  68 y.o. 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 -Him instructed to come to ED by his GI physician as he had a capsule endoscopy done yesterday, which was significant for active GI bleed in small bowel, most likely midsection, patient/wife at bedside assists with the history reports he is still having active melena, last time he took Eliquis  was yesterday at 2 PM, he did not eat anything today, last thing he had was black coffee at 9 AM. -In ED his hemoglobin at 8.9, was 9.8 on 10/19/2023, otherwise no significant labs abnormalities, he was typed and screened, and [facility] hospitalist consulted to admit, plan for enteroscopy this afternoon as discussed with GI.   Dr. Cinderella arranged for transfer to [facility] for balloon enteroscopy likely on Sun 6/22 in anticipation for procedure to be done on 6/23.    Hospital Course by listed problems addressed   Symptomatic anemia Acute blood loss anemia Acute GI bleed, mid small bowel -Patient presented with melena, hemoglobin slowly  trending down, it is 8.9. - followed CBC closely, transfuse as needed, but so far no indication for transfusion, will keep active type and screen. - Continue to hold apixaban . - Management per GI, discussed with GI on-call, patient will need push enteroscopy - Recent hospitalization hospitalization s/p EGD significant for erythematous duodenopathy ,otherwise within normal limits and colonoscopy 10/12/2023 significant for polyp in the cecum, and severe diverticulosis and nonbleeding internal hemorrhoids .   Enteroscopy Dr. Cinderella done on 10/27/23 Patient underwent Enteroscopy  under propofol  sedation.  Tolerated the procedure adequately.    FINDINGS:   - Normal esophagus.    - Normal stomach.    - Normal examined duodenum.    - The examined portion of the jejunum was normal. Tattooed the distal extend of the small bowel reached .    - No specimens collected. No evidence of active or old bleeding seen throughout the examination   RECOMMENDATIONS   Keep NPO    CBC q12 hours   Given lesion is at 57% SBTT , had evidence of bleeding on SBE yesterday and clinical indication for anticoagulation ( strokes) ; will reach out to [facility] for anterograde double balloon enteroscopy  informed by GI, Dr. Cinderella, whose assistance is greatly appreciated and arranging for transfer care to [facility], patient accepted to [facility] for balloon push enteroscopy, transfer anticipated on Sunday afternoon/evening for procedure on Monday, will keep on full liquid diet today, and will transition to clear liquid diet tomorrow evening, accepting physician Dr. Lynwood Barrio (hospitalist)     History of cryptogenic stroke-aphasic - Apixaban , remains on hold . - Resume statin after procedure    Essential hypertension - Recent hospitalization his amlodipine and retrolithiasis side has been  discontinued due to soft blood pressure . - Continue with Coreg  (dose has been lowered during recent hospitalization as well)     Mixed hyperlipidemia  - Continue statin   History of depression -Continue with sertraline    Coronary Artery disease with history of CABG  - No chest pain presently shortness of breath, he is on Eliquis , so not on aspirin . 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   carvedilol  6.25 MG tablet Commonly known as: COREG  Take 1 tablet (6.25 mg total) by mouth 2 (two) times daily with a meal.   multivitamin capsule Take 1 capsule by mouth daily. For men > 50   pantoprazole  40 MG tablet Commonly known as: Protonix  Take 1 tablet (40 mg total) by mouth 2 (two) times daily.   sertraline  50 MG tablet Commonly known as: ZOLOFT  Take 1 tablet (50 mg total) by mouth daily.        Allergies  Allergen Reactions   Vancomycin Other (See Comments)    Flush all over body during surgery Redman syndrome   Alteplase  Swelling and Other (See Comments)    Angioedema    Allergies as of 10/29/2023       Reactions   Vancomycin Other (See Comments)   Flush all over body during surgery Redman syndrome   Alteplase  Swelling, Other (See  Comments)   Angioedema         Medication List     STOP taking these medications    acetaminophen  500 MG tablet Commonly known as: TYLENOL    apixaban  5 MG Tabs tablet Commonly known as: Eliquis    docusate sodium  100 MG capsule Commonly known as: Colace   ferrous sulfate  325 (65 FE) MG EC tablet       TAKE these medications    atorvastatin  80 MG tablet Commonly known as: LIPITOR  Take 1 tablet (80 mg total) by mouth daily.   carvedilol  6.25 MG tablet Commonly known as: COREG  Take 1 tablet (6.25 mg total) by mouth 2 (two) times daily with a meal.   multivitamin capsule Take 1 capsule by mouth daily. For men > 50   pantoprazole  40 MG tablet Commonly known as: Protonix  Take 1 tablet (40 mg total) by mouth 2 (two) times daily.   sertraline  50 MG tablet Commonly known as: ZOLOFT  Take 1 tablet (50 mg total) by mouth daily.        Procedures/Studies: No results found.   Subjective: Pt reporting no specific complaints.  Agreeable to transfer to  [facility] for GI procedure, tolerating clears.   Discharge Exam: Vitals:   10/29/23 0451 10/29/23 1337  BP: (!) 137/50 (!) 112/57  Pulse: 62 66  Resp: 17 18  Temp: 98 F (36.7 C) 98.4 F (36.9 C)  SpO2: 97% 97%   Vitals:   10/28/23 1300 10/28/23 2012 10/29/23 0451 10/29/23 1337  BP: (!) 97/49 (!) 137/57 (!) 137/50 (!) 112/57  Pulse: 80 (!) 58 62 66  Resp: 16 20 17 18   Temp: 98.4 F (36.9 C) 98.5 F (36.9 C) 98 F (36.7 C) 98.4 F (36.9 C)  TempSrc: Oral Oral Oral Oral  SpO2: 94% 97% 97% 97%  Weight:      Height:        General: Pt is alert, awake, not in acute distress Cardiovascular: RRR, S1/S2 +, no rubs, no gallops Respiratory: CTA bilaterally, no wheezing, no rhonchi Abdominal: Soft, NT, ND, bowel sounds + Extremities: no edema, no cyanosis   The results of significant diagnostics from this hospitalization (including imaging, microbiology, ancillary and laboratory) are listed below for reference.      Microbiology: No results found for this or any previous visit (from the past 240 hours).   Labs: BNP (last 3 results) No results for input(s): BNP in the last 8760 hours. Basic Metabolic Panel: Recent Labs  Lab 10/27/23 1352 10/28/23 0449 10/29/23 0327  NA 138 139 140  K 3.7 3.8 3.8  CL 103 105 107  CO2 24 26 26   GLUCOSE 95 102* 97  BUN 9 8 7*  CREATININE 0.78 0.72 0.73  CALCIUM  8.8* 8.5* 8.5*   Liver Function Tests: Recent Labs  Lab 10/27/23 1352  AST 20  ALT 15  ALKPHOS 111  BILITOT 0.4  PROT 7.0  ALBUMIN 3.5   No results for input(s): LIPASE, AMYLASE in the last 168 hours. No results for input(s): AMMONIA in the last 168 hours. CBC: Recent Labs  Lab 10/27/23 1352 10/28/23 0004 10/28/23 0449 10/29/23 0327  WBC 8.6 12.6* 10.6* 7.4  HGB 8.9* 8.3* 7.9* 7.8*  HCT 30.2* 27.9* 26.7* 26.5*  MCV 78.9* 78.4* 78.1* 78.2*  PLT 507* 429* 458* 454*   Cardiac Enzymes: No results for input(s): CKTOTAL, CKMB, CKMBINDEX, TROPONINI in the last 168 hours. BNP: Invalid input(s): POCBNP CBG: No results for input(s): GLUCAP in the last 168 hours. D-Dimer No results for input(s): DDIMER in the last 72 hours. Hgb A1c No results for input(s): HGBA1C in the last 72 hours. Lipid Profile No results for input(s): CHOL, HDL, LDLCALC, TRIG, CHOLHDL, LDLDIRECT in the last 72 hours. Thyroid function studies No results for input(s): TSH, T4TOTAL, T3FREE, THYROIDAB in the last 72 hours.  Invalid input(s): FREET3 Anemia work up No results for input(s): VITAMINB12, FOLATE, FERRITIN, TIBC, IRON, RETICCTPCT in the last 72 hours. Urinalysis    Component Value Date/Time   COLORURINE YELLOW 09/27/2016 0623   APPEARANCEUR CLOUDY (A) 09/27/2016 0623   LABSPEC >1.046 (H) 09/27/2016 0623   PHURINE 5.0 09/27/2016 0623   GLUCOSEU NEGATIVE 09/27/2016 0623   HGBUR SMALL (A) 09/27/2016 0623   BILIRUBINUR NEGATIVE 09/27/2016 0623    KETONESUR 5 (A) 09/27/2016 0623   PROTEINUR NEGATIVE 09/27/2016 0623   NITRITE NEGATIVE 09/27/2016 0623   LEUKOCYTESUR NEGATIVE 09/27/2016 0623   Sepsis Labs Recent Labs  Lab 10/27/23 1352 10/28/23 0004 10/28/23 0449 10/29/23 0327  WBC 8.6 12.6* 10.6* 7.4   Microbiology No results found for this or any previous visit (from the past 240 hours).  Time coordinating discharge: 58  mins   SIGNED:  Afton Louder, MD  [facility] 10/29/2023, 2:01 PM How to contact the [facility] Attending or Consulting provider 7A - 7P or covering provider during after hours 7P -7A, for this patient?  Check the care team in [facility] and look for a) attending/consulting [facility] provider listed and b) the [facility] team listed Log into [email or web address] and use [place]'s universal password to access. If you do not have the password, please contact the hospital operator. Locate the [facility] provider you are looking for under [facility] and page to a number that you can be directly reached. If you still have difficulty reaching the provider, please page the [facility] (Director on Call) for the Hospitalists listed on amion for assistance.

## 2023-10-29 NOTE — Plan of Care (Signed)
  Problem: Pain Managment: Goal: General experience of comfort will improve and/or be controlled Outcome: Progressing   Problem: Safety: Goal: Ability to remain free from injury will improve Outcome: Progressing   Problem: Skin Integrity: Goal: Risk for impaired skin integrity will decrease Outcome: Progressing

## 2023-11-01 ENCOUNTER — Encounter (HOSPITAL_COMMUNITY): Payer: Self-pay | Admitting: Gastroenterology

## 2023-11-01 NOTE — Discharge Summary (Addendum)
 [facility] Medicine Discharge Summary  Admit Date: 10/30/2023 Discharge Date: 11/01/2023  Admitting Physician: Lynwood Taft Barrio, MD Discharge Physician: Doyal JAYSON Cola, MD   Primary Care Provider: Brien FORBES Sake, MD, Phone [phone]  Discharge Destination: Home  Admission Diagnoses:  small bowel bleed, needs DBE  Discharge Diagnoses:  Principal Problem:   Gastrointestinal hemorrhage with melena Active Problems:   Hypertension Resolved Problems:   * No resolved hospital problems. *  Primary Diagnosis: Admitted for small bowel bleed, did not undergo double-balloon enteroscopy as patient was not bleeding by the time he arrived to [facility].  Neurology engaged for history of recurrent strokes on Eliquis  for prevention.  Per neurology patient should stop Eliquis  and start aspirin .  Changes Made (with rationale):   Permanently discontinue Eliquis  Eliquis  should not be restarted because patient has blood from his small bowel twice so far Start aspirin  81 mg  To-Do List (incidental findings, follow-up studies, etc.):  PCP attention to follow-up Patient is to have close follow-up to reduce any risk factors for stroke. Outpatient follow-up with neurology, referral sent to [facility]  Anticipatory Guidance for Outpatient Care:   Repeat hemoglobin with PCP    Results Pending at Discharge:  None Please see phone numbers at end of this summary for lab contact information.   Follow-up/Care Transition Plan: Sched. appts: No future appointments.  Follow-up info: No follow-up provider specified.     Allergies/Intolerances:  Allergies  Allergen Reactions  . Alteplase  Other (See Comments)    Sneezing - wife can't remember for sure   . Vancomycin Other (See Comments)    Flush all over body during surgery     New Adverse Drug Events: none  Medications:     Current Discharge Medication List     START taking these medications       Instructions  aspirin  81 MG EC tablet Quantity: 30 tablet Refills: 0 Replaces: aspirin  325 MG tablet  Take 1 tablet (81 mg total) by mouth once daily Last time this was given: 81 mg on November 01, 2023  8:25 AM       These medications have been CHANGED      Instructions  atorvastatin  80 MG tablet Refills: 0 What changed: Another medication with the same name was removed. Continue taking this medication, and follow the directions you see here.  Commonly known as: LIPITOR  Take 80 mg by mouth once daily Last time this was given: 80 mg on November 01, 2023  8:25 AM       CONTINUE taking these medications      Instructions  acetaminophen  500 mg capsule Refills: 0  Commonly known as: TYLENOL  Take 500 mg by mouth every 4 (four) hours as needed for Fever.   carvediloL  6.25 MG tablet Refills: 0  Commonly known as: COREG  Take 6.25 mg by mouth 2 (two) times daily with meals Last time this was given: 6.25 mg on November 01, 2023  8:25 AM   clobetasoL 0.05 % ointment Quantity: 60 g Refills: 6  Commonly known as: TEMOVATE Apply topically 2 (two) times daily. Use as lubrication for dilation   sertraline  50 MG tablet Refills: 0  Commonly known as: ZOLOFT  Take 50 mg by mouth once daily Last time this was given: 50 mg on November 01, 2023  8:25 AM       STOP taking these medications    aspirin  325 MG tablet Replaced by: aspirin  81 MG EC tablet   clopidogreL 75 mg tablet Commonly  known as: PLAVIX         Brief History of Present Illness:   Johnny Sullivan is a 68 y.o. male  with PMH of hypertension, hyperlipidemia, coronary artery disease s/p CABG and stroke in 2018 and depression was initially seen in the outside hospital for GI bleed and Hemoccult positive stool at that time required 1 unit of PRBC hemoglobin went up from 7.1-9.8, EGD  10/11/23 was significant for erythemayous duodenopathy otherwise within normal limits and colonoscopy 10/12/2023 significant for polyp in the cecum  and severe diverticulosis and nonbleeding internal hemorrhoids.   He had a capsule endoscopy which showed significant active GI bleed in the small bowel went to the outside hospital after discussing with the GI transfer to the [facility] for balloon enteroscopy. Patient has aphasia due to his previous stroke able to understand but cannot give detailed history, wife at the bedside help with taking history.  Last time take eliquis  was on 06/19/ 25 afternoon. Last dark tarry stool was 10/27/23. Denies nausea, vomiting, chest pain, abdominal pain, urinary symptoms, chills and fever _____________________   Hospital Course by Problem:  Small bowel bleed - resolved  Chronic AC with Eliquis   Bleeding like enhanced by anticoagulant  Recurrent, on Eliquis  for stroke prevention Video capsule at OSH indicating small bowel bleed Hb stable here, stools normal no bleed - GI deferred coping in absence of bleed as low yield Patiently challenged on heparin drip without be rebleed Neurology engaged, they recommend stopping Eliquis  for stroke prevention, obtaining a platelet aggregation study which was normal, CT head and neck which indicated just very mild carotid artery stenosis.  Per neurology recommendation patient was started on aspirin  81 mg for stroke prevention.  Hemoglobin at discharge 8.3 -not require blood transfusion this admission   Left MCA stroke, recurrent  2018, no neuro deficits  Suspected due to A Fib which was not identified by cardiology via loop recorder Lipitor  80  Eliquis   Consulted neuro re: plavix instead of Eliquis  (has had two bleeds on Eliquis  and this had to be stopped because of the bleeds leaving him at risk from anemia/bleed and lack of stroke prevention) - see above  [ ]  referral sent to [facility] neurology for outpatient followup    CAD s/p CABG  Aspirin , carvedilol , lipitor    Surgeries and Procedures Performed:  NONE   _____________________  Discharge Exam:   BP 118/53 (BP Location: Left upper arm)   Pulse 60   Temp 36.8 C (98.2 F) (Oral)   Resp 14   Ht 175.3 cm (5' 9.02)   Wt 90.7 kg (199 lb 15.3 oz)   SpO2 95%   BMI 29.51 kg/m      Physical Exam  General alert, cooperative, in NAD  Eyes conjunctiva clear, anicteric sclera  HENT oropharynx clear, moist mucous membranes  CV regular rate and rhythm, without murmurs, rubs or gallops  Resp clear to auscultation, good air exchange  Abd soft, nontender, nondistended, normoactive bowel sounds   Ext no lower extremity edema  Skin no rashes or lesions  Psych oriented to time, place and person, mood and affect are approprate  Other NA    Pertinent Lab Testing: Recent Labs  Lab 10/30/23 0632 10/31/23 0130 11/01/23 0503  NA 138 138 139  K 3.7 3.6 3.6  CL 105 106 106  CO2 26 25 25   BUN 5* 5* 10  CREATININE 0.8 0.8 0.7  GLUCOSE 97 95 92  CALCIUM  8.9 8.6* 8.6*   Recent Labs  Lab 10/31/23 0130 11/01/23 0503  AST 18 15  ALT 16 14*  ALKPHOS 83 81  TBILI 0.5 0.5    Recent Labs  Lab 10/30/23 0632 10/31/23 0130 11/01/23 0503  WBC 7.8 9.1 6.8  HGB 8.4* 8.5* 8.3*  HCT 28.3* 28.1* 28.0*  PLT 476* 481* 447   Recent Labs  Lab 10/30/23 0632 10/30/23 1417 10/31/23 0704  APTT 31.2   < > 77.6*  INR 1.1  --   --    < > = values in this interval not displayed.     Other Pertinent Labs:    Micro:      Pertinent Imaging:    _____________________  Code Status: Prior Goals of care: were not addressed this admission    Status on Discharge:  Current activity: Walks occasionally (11/01/23 9177) Current mobility: No limitation (11/01/23 9177)  Activity Recommendation: activity as tolerated  Other Discharge Instructions: Services setup at discharge: None Tubes/lines at discharge: None  Diet: No diet orders on file  Wound Care Order Instructions     None       _____________________  Time spent on discharge process: 35 minutes    DIANA LELA COLA,  MD [facility]  11/01/2023   Hospital Contact Information:  Madie [address] [facility]) [facility]) [facility])  Pending tests:  Laboratory: [phone] Microbiology: [phone] Pathology: [phone] Radiology: [phone]  General questions: [phone] Pending tests: Laboratory: [phone] Microbiology: [phone] Pathology: [phone] Radiology: [phone]  General questions:  [phone] Pending tests:  Laboratory: [phone] Microbiology: [phone] Pathology: [phone] Radiology: [phone]  General questions:  [phone]

## 2023-11-07 NOTE — Progress Notes (Signed)
 3 yr TCS noted in recall Patient result letter mailed procedure note and pathology result faxed to PCP

## 2023-11-16 ENCOUNTER — Encounter (INDEPENDENT_AMBULATORY_CARE_PROVIDER_SITE_OTHER): Payer: Self-pay | Admitting: Gastroenterology

## 2023-11-16 ENCOUNTER — Ambulatory Visit (INDEPENDENT_AMBULATORY_CARE_PROVIDER_SITE_OTHER): Admitting: Gastroenterology

## 2023-11-16 VITALS — BP 148/75 | HR 71 | Temp 97.8°F | Ht 70.0 in | Wt 209.5 lb

## 2023-11-16 DIAGNOSIS — D500 Iron deficiency anemia secondary to blood loss (chronic): Secondary | ICD-10-CM

## 2023-11-16 DIAGNOSIS — K922 Gastrointestinal hemorrhage, unspecified: Secondary | ICD-10-CM

## 2023-11-16 DIAGNOSIS — D509 Iron deficiency anemia, unspecified: Secondary | ICD-10-CM | POA: Insufficient documentation

## 2023-11-16 DIAGNOSIS — K5521 Angiodysplasia of colon with hemorrhage: Secondary | ICD-10-CM | POA: Insufficient documentation

## 2023-11-16 DIAGNOSIS — K921 Melena: Secondary | ICD-10-CM

## 2023-11-16 NOTE — Progress Notes (Signed)
 Toribio Fortune, M.D. Gastroenterology & Hepatology [facility] [address] [place], [place] [address]  Primary Care Physician: [name], MD [address] [place] [address]  I will communicate my assessment and recommendations to the referring MD via EMR.  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  The patient was last seen on 10/09/2023. At that time, the patient was scheduled for EGD and colonoscopy as part of the evaluation of melena.  He was started on omeprazole  40 mg every day.  Patient was found to have severe drop in his hemoglobin down to 7.1 with very low iron stores and was advised to go to the ER.  Patient was admitted to [facility] on 10/09/2023.  Patient had an EGD which showed presence of normal esophagus, stomach and erythematous duodenopathy.  This was followed by a colonoscopy on 10/12/2023.  At 7 mm polyp was removed from the cecum, there was presence of diverticulosis.  Patient was scheduled for an outpatient capsule endoscopy and was restarted on Eliquis .  Notably, his capsule endoscopy on 10/26/2023 showed presence of possible AVM in the small bowel with presence of active fresh bleeding. There was rapid transit in the small bowel.  Due to this, the patient was advised to come back to the hospital.  He underwent an enteroscopy by Dr. Cinderella on 10/27/2023 which was completely unremarkable up to the proximal part of the jejunum which was the tattoed.  Patient was transferred to [facility] for further management with possible double-balloon enteroscopy..  During his admission at [facility], patient was evaluated by the  inpatient gastroenterology team.  As the patient was not presenting any further drop in his hemoglobin, no further procedures were performed as it was considered he will have a low yield to have evidence of bleeding during the endoscopy.He has been off Eliquis  since discharged from [facility] after case was discussed by inpatient neurology team at [facility], especially as he had very mild carotid artery stenosis. He is on aspirin  81 mg based on testing. Has not followed with neurology yet.  His recent hemoglobin on 11/01/2023 was 8.3.  Platelets were 447 and WBC was 6.8. Wife reports he  had a repeat CBC last week with repeat Hb 8.3.  Wife reports that after he was discharged from the hospital, his stools remained brown for 3 days. She started him on ferrous sulfate  324 mg daily after this and his stools have been dark since.  The patient denies having any nausea, vomiting, fever, chills, hematochezia,  hematemesis, abdominal distention, abdominal pain, diarrhea, jaundice, pruritus or weight loss. Has not felt lightheaded or dizzy.  Last EGD: As above Last Colonoscopy: As above  Past Medical History: Past Medical History:  Diagnosis Date   Arthritis    Osteoarthritis- right hip   Coronary artery disease    GERD (gastroesophageal reflux disease)    Hypertension    Myocardial infarction (HCC)     mild-Dr. Zakhary,cardiolgy-[place] follows -gives clearance.   Stroke [facility])    Urethral stricture    'uses self [place] caths weekly to correct.    Past Surgical History: Past Surgical History:  Procedure Laterality Date   BACK SURGERY  lumbar discectomy   CARDIAC CATHETERIZATION     COLONOSCOPY N/A 10/12/2023   Procedure: COLONOSCOPY;  Surgeon: Cinderella Deatrice FALCON, MD;  Location: [facility];  Service: Endoscopy;  Laterality: N/A;   CORONARY ARTERY BYPASS GRAFT     09-20-2012 x3 -[place]   ENTEROSCOPY N/A 10/27/2023   Procedure: ENTEROSCOPY;  Surgeon: Cinderella Deatrice FALCON, MD;  Location: [facility] END[facility];  Service: Endoscopy;  Laterality: N/A;   ESOPHAGOGASTRODUODENOSCOPY N/A 10/11/2023   Procedure: EGD (ESOPHAGOGASTRODUODENOSCOPY);  Surgeon: Eartha Flavors, Toribio, MD;  Location: [facility] END[facility];  Service: Gastroenterology;  Laterality: N/A;   GIVENS CAPSULE STUDY N/A 10/26/2023   Procedure: IMAGING PROCEDURE, GI TRACT, INTRALUMINAL, VIA CAPSULE;  Surgeon: Eartha Flavors, Toribio, MD;  Location: [facility];  Service: Gastroenterology;  Laterality: N/A;  730am   IR PERCUTANEOUS ART THROMBECTOMY/INFUSION INTRACRANIAL INC DIAG ANGIO  09/26/2016   LOOP RECORDER INSERTION N/A 09/30/2016   Procedure: Loop Recorder Insertion;  Surgeon: Kelsie Agent, MD;  Location: [facility] INVASIVE CV LAB;  Service: Cardiovascular;  Laterality: N/A;   RADIOLOGY WITH ANESTHESIA N/A 09/26/2016   Procedure: RADIOLOGY WITH ANESTHESIA;  Surgeon: Dolphus Carrion, MD;  Location: [facility] OR;  Service: Radiology;  Laterality: N/A;   TEE WITHOUT CARDIOVERSION N/A 09/30/2016   Procedure: TRANSESOPHAGEAL ECHOCARDIOGRAM (TEE);  Surgeon: Rolan Ezra RAMAN, MD;  Location: [facility] ENDOSCOPY;  Service: Cardiovascular;  Laterality: N/A;   TOTAL HIP ARTHROPLASTY Right 04/26/2016   Procedure: RIGHT TOTAL HIP ARTHROPLASTY ANTERIOR APPROACH;  Surgeon: Donnice Car, MD;  Location: [facility] ORS;  Service: Orthopedics;  Laterality: Right;   VASECTOMY     WRIST SURGERY     implant of plastic bone-mild ROM limits    Family History: Family History  Problem Relation Age of Onset   Alzheimer's disease Mother     Social History: Social History   Tobacco Use  Smoking Status Former   Current packs/day: 0.00   Average packs/day: 1.5 packs/day for 15.0 years (22.5 ttl pk-yrs)   Types: Cigarettes   Start date: 04/20/1997   Quit date: 04/20/2012   Years since quitting: 11.5  Smokeless Tobacco Former   Types: Chew   Quit date: 04/20/2012   Social History   Substance and Sexual Activity  Alcohol Use Yes   Comment: occ. 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 Swallow whole.     atorvastatin  (LIPITOR ) 80 MG tablet Take 1 tablet (80 mg total) by mouth daily.     carvedilol  (COREG ) 6.25 MG tablet Take 1 tablet (6.25 mg total) by mouth 2 (two) times daily with a meal.     docusate sodium  (COLACE) 100 MG capsule Take 100 mg by mouth 2 (two) times daily.     ferrous sulfate  324 MG TBEC Take 324 mg by mouth daily at 6 (six) AM.     Multiple Vitamin (MULTIVITAMIN) capsule Take 1 capsule by mouth daily. For men > 50     pantoprazole  (PROTONIX ) 40 MG tablet Take 1 tablet (40 mg total) by mouth 2 (two) times daily. (Patient taking differently: Take 40 mg by mouth daily.)  sertraline  (ZOLOFT ) 50 MG tablet Take 1 tablet (50 mg total) by mouth daily.     No current facility-administered medications for this visit.    Review of Systems: GENERAL: negative for malaise, night sweats HEENT: No changes in hearing or vision, no nose bleeds or other nasal problems. NECK: Negative for lumps, goiter, pain and significant neck swelling RESPIRATORY: Negative for cough, wheezing CARDIOVASCULAR: Negative for chest pain, leg swelling, palpitations, orthopnea GI: SEE HPI MUSCULOSKELETAL: Negative for joint pain or swelling, back pain, and muscle pain. SKIN: Negative for lesions, rash PSYCH: Negative for sleep disturbance, mood disorder and recent psychosocial stressors. HEMATOLOGY Negative for prolonged bleeding, bruising easily, and swollen nodes. ENDOCRINE: Negative for cold or heat intolerance, polyuria, polydipsia and goiter. NEURO: negative for tremor, gait imbalance, syncope and  seizures. The remainder of the review of systems is noncontributory.   Physical Exam: BP (!) 148/75 (BP Location: Left Arm, Patient Position: Sitting, Cuff Size: Normal)   Pulse 71   Temp 97.8 F (36.6 C) (Temporal)   Ht 5' 10 (1.778 m)   Wt 209 lb 8 oz (95 kg)   BMI 30.06 kg/m  GENERAL: The patient is AO x3, in no acute distress. HEENT: Head is normocephalic and atraumatic. EOMI are intact. Mouth is well hydrated and without lesions. NECK: Supple. No masses LUNGS: Clear to auscultation. No presence of rhonchi/wheezing/rales. Adequate chest expansion HEART: RRR, normal s1 and s2. ABDOMEN: Soft, nontender, no guarding, no peritoneal signs, and nondistended. BS +. No masses. EXTREMITIES: Without any cyanosis, clubbing, rash, lesions or edema. NEUROLOGIC: AOx3, has expressive aphasia, no focal motor deficit. SKIN: no jaundice, no rashes  Imaging/Labs: as above  I personally reviewed and interpreted the available labs, imaging and endoscopic files.  Impression and Plan: Kaisen Ackers is a 68 y.o. 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  Patient had recurrent episodes of melena while on anticoagulation.  Had negative bidirectional endoscopy evaluation but was found to have bleeding from the mid small bowel while on anticoagulation, possibly from an AVM.  After his case was discussed by the inpatient gastroenterology team at [facility], no further procedure was performed, especially as neurology recommended discontinuing his anticoagulation and keeping him on low-dose aspirin .  This has led to resolution of his melena, although his stools are still dark due to iron supplementation.  For now, we will continue surveillance of his globin and iron stores, which will need to be checked next week.  As he did not  have any evidence of peptic ulcer disease, he can stop his PPI for now.  He should follow closely with his neurologist regarding his stroke prophylaxis.  -Check CBC and iron panel next Thursday -Continue oral iron supplementation -Can stop PPI  -Follow closely with neurologist.  All questions were answered.      Toribio Fortune, MD Gastroenterology and Hepatology [facility]

## 2023-11-16 NOTE — Patient Instructions (Addendum)
 Perform blood workup next Thursday Continue oral iron supplementation Can stop PPI for now Follow closely with neurologist.

## 2023-11-23 ENCOUNTER — Other Ambulatory Visit (INDEPENDENT_AMBULATORY_CARE_PROVIDER_SITE_OTHER): Payer: Self-pay | Admitting: Gastroenterology

## 2023-11-29 ENCOUNTER — Other Ambulatory Visit (INDEPENDENT_AMBULATORY_CARE_PROVIDER_SITE_OTHER): Payer: Self-pay

## 2023-11-29 ENCOUNTER — Ambulatory Visit (INDEPENDENT_AMBULATORY_CARE_PROVIDER_SITE_OTHER): Payer: Self-pay | Admitting: Gastroenterology

## 2023-11-29 DIAGNOSIS — D500 Iron deficiency anemia secondary to blood loss (chronic): Secondary | ICD-10-CM

## 2023-11-29 DIAGNOSIS — K921 Melena: Secondary | ICD-10-CM

## 2023-11-29 LAB — CBC WITH DIFFERENTIAL/PLATELET
Basophils Absolute: 0 x10E3/uL (ref 0.0–0.2)
Basos: 0 %
EOS (ABSOLUTE): 0.2 x10E3/uL (ref 0.0–0.4)
Eos: 1 %
Hematocrit: 32 % — ABNORMAL LOW (ref 37.5–51.0)
Hemoglobin: 9.3 g/dL — ABNORMAL LOW (ref 13.0–17.7)
Immature Grans (Abs): 0 x10E3/uL (ref 0.0–0.1)
Immature Granulocytes: 0 %
Lymphocytes Absolute: 1.8 x10E3/uL (ref 0.7–3.1)
Lymphs: 14 %
MCH: 24.2 pg — ABNORMAL LOW (ref 26.6–33.0)
MCHC: 29.1 g/dL — ABNORMAL LOW (ref 31.5–35.7)
MCV: 83 fL (ref 79–97)
Monocytes Absolute: 0.8 x10E3/uL (ref 0.1–0.9)
Monocytes: 7 %
Neutrophils Absolute: 9.5 x10E3/uL — ABNORMAL HIGH (ref 1.4–7.0)
Neutrophils: 78 %
Platelets: 457 x10E3/uL — ABNORMAL HIGH (ref 150–450)
RBC: 3.85 x10E6/uL — ABNORMAL LOW (ref 4.14–5.80)
RDW: 20.1 % — ABNORMAL HIGH (ref 11.6–15.4)
WBC: 12.3 x10E3/uL — ABNORMAL HIGH (ref 3.4–10.8)

## 2023-11-29 LAB — IRON AND TIBC

## 2023-11-29 LAB — FERRITIN

## 2023-12-06 ENCOUNTER — Ambulatory Visit (INDEPENDENT_AMBULATORY_CARE_PROVIDER_SITE_OTHER): Payer: Self-pay | Admitting: Gastroenterology

## 2023-12-06 LAB — IRON,TIBC AND FERRITIN PANEL
Ferritin: 32 ng/mL (ref 30–400)
Iron Saturation: 24 % (ref 15–55)
Iron: 76 ug/dL (ref 38–169)
Total Iron Binding Capacity: 316 ug/dL (ref 250–450)
UIBC: 240 ug/dL (ref 111–343)

## 2024-02-21 ENCOUNTER — Encounter (INDEPENDENT_AMBULATORY_CARE_PROVIDER_SITE_OTHER): Payer: Self-pay | Admitting: Gastroenterology
